# Patient Record
Sex: Male | Born: 1945 | Race: White | Hispanic: No | Marital: Married | Smoking: Former smoker
Health system: Southern US, Community
[De-identification: ages and names within clinical notes are randomized; demographics above are authoritative.]

## PROBLEM LIST (undated history)

## (undated) DIAGNOSIS — D649 Anemia, unspecified: Secondary | ICD-10-CM

## (undated) DIAGNOSIS — G20 Parkinson's disease: Secondary | ICD-10-CM

## (undated) DIAGNOSIS — S42009A Fracture of unspecified part of unspecified clavicle, initial encounter for closed fracture: Secondary | ICD-10-CM

## (undated) DIAGNOSIS — I10 Essential (primary) hypertension: Secondary | ICD-10-CM

## (undated) DIAGNOSIS — E039 Hypothyroidism, unspecified: Secondary | ICD-10-CM

## (undated) DIAGNOSIS — F419 Anxiety disorder, unspecified: Secondary | ICD-10-CM

## (undated) DIAGNOSIS — M1990 Unspecified osteoarthritis, unspecified site: Secondary | ICD-10-CM

## (undated) DIAGNOSIS — R0600 Dyspnea, unspecified: Secondary | ICD-10-CM

## (undated) DIAGNOSIS — K219 Gastro-esophageal reflux disease without esophagitis: Secondary | ICD-10-CM

## (undated) HISTORY — PX: APPENDECTOMY: SHX54

## (undated) HISTORY — PX: CHOLECYSTECTOMY: SHX55

---
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
## 1984-11-11 HISTORY — PX: LAMINECTOMY: SHX219

## 1999-11-12 HISTORY — PX: ROTATOR CUFF REPAIR: SHX139

## 2008-11-11 DIAGNOSIS — G20 Parkinson's disease: Secondary | ICD-10-CM

## 2008-11-11 HISTORY — DX: Parkinson's disease: G20

## 2018-01-13 NOTE — Pre-Procedure Instructions (Signed)
Reginald Jenkins  01/13/2018      [id number] - [place], [place] - [address] [facility] [address] [place] [id number] Phone: [phone] Fax: [phone]    Your procedure is scheduled on March 14  [facility] Admitting at 1100 A.M.  Call this number if you have problems the morning of surgery:  [phone]   Remember:  Do not eat food or drink liquids after midnight.  Take these medicines the morning of surgery with A SIP OF WATER Carbidopa-levodopa (Sinemet), Cyclobenzaprine (Flexeril) if needed, Levothyroxine (Synthroid), Patanol eye drops if needed, oxycodone, trihexyphenidyl (Artane), pramipexole (Mirapex)   Stop taking aspirin as directed by your Dr.  Stop taking BC's, Goody's, Herbal medications, Fish Oil, Ibuprofen, Advil, Motrin, Aleve, Vitamins   Do not wear jewelry, make-up or nail polish.  Do not wear lotions, powders, or perfumes, or deodorant.  Do not shave 48 hours prior to surgery.  Men may shave face and neck.  Do not bring valuables to the hospital.  [facility] is not responsible for any belongings or valuables.  Contacts, dentures or bridgework may not be worn into surgery.  Leave your suitcase in the car.  After surgery it may be brought to your room.  For patients admitted to the hospital, discharge time will be determined by your treatment team.  Patients discharged the day of surgery will not be allowed to drive home.   Special instructions:   - Preparing For Surgery  Before surgery, you can play an important role. Because skin is not sterile, your skin needs to be as free of germs as possible. You can reduce the number of germs on your skin by washing with CHG (chlorahexidine gluconate) Soap before surgery.  CHG is an antiseptic cleaner which kills germs and bonds with the skin to continue killing germs even after washing.  Please do not use if you have an allergy to CHG or  antibacterial soaps. If your skin becomes reddened/irritated stop using the CHG.  Do not shave (including legs and underarms) for at least 48 hours prior to first CHG shower. It is OK to shave your face.  Please follow these instructions carefully.   1. Shower the NIGHT BEFORE SURGERY and the MORNING OF SURGERY with CHG.   2. If you chose to wash your hair, wash your hair first as usual with your normal shampoo.  3. After you shampoo, rinse your hair and body thoroughly to remove the shampoo.  4. Use CHG as you would any other liquid soap. You can apply CHG directly to the skin and wash gently with a scrungie or a clean washcloth.   5. Apply the CHG Soap to your body ONLY FROM THE NECK DOWN.  Do not use on open wounds or open sores. Avoid contact with your eyes, ears, mouth and genitals (private parts). Wash Face and genitals (private parts)  with your normal soap.  6. Wash thoroughly, paying special attention to the area where your surgery will be performed.  7. Thoroughly rinse your body with warm water from the neck down.  8. DO NOT shower/wash with your normal soap after using and rinsing off the CHG Soap.  9. Pat yourself dry with a CLEAN TOWEL.  10. Wear CLEAN PAJAMAS to bed the night before surgery, wear comfortable clothes the morning of surgery  11. Place CLEAN SHEETS on your bed the night of your first shower  and DO NOT SLEEP WITH PETS.    Day of Surgery: Do not apply any deodorants/lotions. Please wear clean clothes to the hospital/surgery center.      Please read over the following fact sheets that you were given. Pain Booklet, Coughing and Deep Breathing, MRSA Information and Surgical Site Infection Prevention

## 2018-01-14 ENCOUNTER — Encounter (HOSPITAL_COMMUNITY): Payer: Self-pay

## 2018-01-14 ENCOUNTER — Other Ambulatory Visit: Payer: Self-pay

## 2018-01-14 ENCOUNTER — Encounter (HOSPITAL_COMMUNITY)
Admission: RE | Admit: 2018-01-14 | Discharge: 2018-01-14 | Disposition: A | Discharge status: Home / Self Care | Payer: Non-veteran care | Source: Ambulatory Visit | Attending: Orthopedic Surgery | Admitting: Orthopedic Surgery

## 2018-01-14 DIAGNOSIS — G20 Parkinson's disease: Secondary | ICD-10-CM | POA: Insufficient documentation

## 2018-01-14 DIAGNOSIS — K219 Gastro-esophageal reflux disease without esophagitis: Secondary | ICD-10-CM | POA: Insufficient documentation

## 2018-01-14 DIAGNOSIS — I10 Essential (primary) hypertension: Secondary | ICD-10-CM | POA: Insufficient documentation

## 2018-01-14 DIAGNOSIS — E039 Hypothyroidism, unspecified: Secondary | ICD-10-CM | POA: Insufficient documentation

## 2018-01-14 DIAGNOSIS — Z79899 Other long term (current) drug therapy: Secondary | ICD-10-CM | POA: Diagnosis not present

## 2018-01-14 DIAGNOSIS — D649 Anemia, unspecified: Secondary | ICD-10-CM | POA: Insufficient documentation

## 2018-01-14 DIAGNOSIS — Z01812 Encounter for preprocedural laboratory examination: Secondary | ICD-10-CM | POA: Insufficient documentation

## 2018-01-14 DIAGNOSIS — Z87891 Personal history of nicotine dependence: Secondary | ICD-10-CM | POA: Insufficient documentation

## 2018-01-14 DIAGNOSIS — Z7982 Long term (current) use of aspirin: Secondary | ICD-10-CM | POA: Diagnosis not present

## 2018-01-14 HISTORY — DX: Parkinson's disease: G20

## 2018-01-14 HISTORY — DX: Anemia, unspecified: D64.9

## 2018-01-14 HISTORY — DX: Essential (primary) hypertension: I10

## 2018-01-14 HISTORY — DX: Hypothyroidism, unspecified: E03.9

## 2018-01-14 HISTORY — DX: Dyspnea, unspecified: R06.00

## 2018-01-14 HISTORY — DX: Gastro-esophageal reflux disease without esophagitis: K21.9

## 2018-01-14 HISTORY — DX: Anxiety disorder, unspecified: F41.9

## 2018-01-14 HISTORY — DX: Unspecified osteoarthritis, unspecified site: M19.90

## 2018-01-14 HISTORY — DX: Fracture of unspecified part of unspecified clavicle, initial encounter for closed fracture: S42.009A

## 2018-01-14 LAB — CBC
HEMATOCRIT: 40.8 % (ref 39.0–52.0)
HEMOGLOBIN: 13.7 g/dL (ref 13.0–17.0)
MCH: 29 pg (ref 26.0–34.0)
MCHC: 33.6 g/dL (ref 30.0–36.0)
MCV: 86.4 fL (ref 78.0–100.0)
Platelets: 224 10*3/uL (ref 150–400)
RBC: 4.72 MIL/uL (ref 4.22–5.81)
RDW: 14.2 % (ref 11.5–15.5)
WBC: 6.5 10*3/uL (ref 4.0–10.5)

## 2018-01-14 LAB — BASIC METABOLIC PANEL
ANION GAP: 12 (ref 5–15)
BUN: 14 mg/dL (ref 6–20)
CHLORIDE: 99 mmol/L — AB (ref 101–111)
CO2: 26 mmol/L (ref 22–32)
Calcium: 9.1 mg/dL (ref 8.9–10.3)
Creatinine, Ser: 0.73 mg/dL (ref 0.61–1.24)
GFR calc Af Amer: >60 mL/min (ref >60)
GLUCOSE: 115 mg/dL — AB (ref 65–99)
POTASSIUM: 4.3 mmol/L (ref 3.5–5.1)
Sodium: 137 mmol/L (ref 135–145)

## 2018-01-14 LAB — SURGICAL PCR SCREEN
MRSA, PCR: NEGATIVE
STAPHYLOCOCCUS AUREUS: NEGATIVE

## 2018-01-14 NOTE — Progress Notes (Signed)
PCP: Rosary Livelyachel Hyler,NP @ [facility]  Last sleep study done 1995 and was negative.

## 2018-01-14 NOTE — Progress Notes (Signed)
   01/14/18 1019  OBSTRUCTIVE SLEEP APNEA  Have you ever been diagnosed with sleep apnea through a sleep study? No  Do you snore loudly (loud enough to be heard through closed doors)?  1  Do you often feel tired, fatigued, or sleepy during the daytime (such as falling asleep during driving or talking to someone)? 0  Has anyone observed you stop breathing during your sleep? 1  Do you have, or are you being treated for high blood pressure? 1  BMI more than 35 kg/m2? 0  Age > 50 (1-yes) 1  Neck circumference greater than:Male 16 inches or larger, Male 17inches or larger? 0  Male Gender (Yes=1) 1  Obstructive Sleep Apnea Score 5  Score 5 or greater  Results sent to PCP

## 2018-01-20 NOTE — Progress Notes (Signed)
Anesthesia Chart Review:  Pt is a 72 year old male scheduled for L total shoulder arthroplasty on 01/22/2018 with Francena HanlyKevin Supple, MD  [facility]  Cleared for surgery by Grayland JackJennifer Richardson, NP.  Pt was also evaluated by cardiologist N.F. Gerilyn PilgrimJarmukli, [facility] and [facility] for [facility].   PMH includes:  HTN, anemia, hypothyroidism, parkinson's disease, GERD. Former smoker (quit 1968). BMI 27  Medications include: ASA 81 mg, carbidopa-levodopa, iron, levothyroxine, lisinopril, trihexyphenidyl  BP 135/74   Pulse 78   Temp 36.8 C   Resp 20   Ht 5\' 9"  (1.753 m)   Wt 183 lb 4.8 oz (83.1 kg)   SpO2 98%   BMI 27.07 kg/m   Preoperative labs reviewed.    [place] [place] NSR.  LAFB.  If no changes, I anticipate pt can proceed with surgery as scheduled.   Rica Mastngela Kabbe, FNP-BC [facility]/Anesthesiology Phone: [phone] 01/20/2018 10:57 AM

## 2018-01-21 MED ORDER — TRANEXAMIC ACID 1000 MG/10ML IV SOLN
1000.0000 mg | INTRAVENOUS | Status: AC
  Administered 2018-01-22: 1000 mg via INTRAVENOUS
  Filled 2018-01-21: qty 1100

## 2018-01-21 MED ORDER — CEFAZOLIN SODIUM-DEXTROSE 2-4 GM/100ML-% IV SOLN
2.0000 g | INTRAVENOUS | Status: AC
  Administered 2018-01-22: 2 g via INTRAVENOUS
  Filled 2018-01-21: qty 100

## 2018-01-22 ENCOUNTER — Inpatient Hospital Stay (HOSPITAL_COMMUNITY)
Admission: RE | Admit: 2018-01-22 | Discharge: 2018-01-23 | DRG: 483 | Disposition: A | Discharge status: Home / Self Care | Payer: Non-veteran care | Source: Ambulatory Visit | Attending: Orthopedic Surgery | Admitting: Orthopedic Surgery

## 2018-01-22 ENCOUNTER — Encounter (HOSPITAL_COMMUNITY)
Admission: RE | Disposition: A | Discharge status: Home / Self Care | Payer: Self-pay | Source: Ambulatory Visit | Attending: Orthopedic Surgery

## 2018-01-22 ENCOUNTER — Inpatient Hospital Stay (HOSPITAL_COMMUNITY): Payer: Non-veteran care | Admitting: Emergency Medicine

## 2018-01-22 ENCOUNTER — Inpatient Hospital Stay (HOSPITAL_COMMUNITY): Payer: Non-veteran care | Admitting: Certified Registered"

## 2018-01-22 DIAGNOSIS — M19012 Primary osteoarthritis, left shoulder: Principal | ICD-10-CM | POA: Diagnosis present

## 2018-01-22 DIAGNOSIS — Z9049 Acquired absence of other specified parts of digestive tract: Secondary | ICD-10-CM

## 2018-01-22 DIAGNOSIS — F419 Anxiety disorder, unspecified: Secondary | ICD-10-CM | POA: Diagnosis present

## 2018-01-22 DIAGNOSIS — G20 Parkinson's disease: Secondary | ICD-10-CM | POA: Diagnosis present

## 2018-01-22 DIAGNOSIS — Z7982 Long term (current) use of aspirin: Secondary | ICD-10-CM

## 2018-01-22 DIAGNOSIS — E039 Hypothyroidism, unspecified: Secondary | ICD-10-CM | POA: Diagnosis present

## 2018-01-22 DIAGNOSIS — Z79899 Other long term (current) drug therapy: Secondary | ICD-10-CM

## 2018-01-22 DIAGNOSIS — Z87891 Personal history of nicotine dependence: Secondary | ICD-10-CM | POA: Diagnosis not present

## 2018-01-22 DIAGNOSIS — I10 Essential (primary) hypertension: Secondary | ICD-10-CM | POA: Diagnosis present

## 2018-01-22 DIAGNOSIS — M25712 Osteophyte, left shoulder: Secondary | ICD-10-CM | POA: Diagnosis present

## 2018-01-22 DIAGNOSIS — Z79890 Hormone replacement therapy (postmenopausal): Secondary | ICD-10-CM | POA: Diagnosis not present

## 2018-01-22 DIAGNOSIS — K219 Gastro-esophageal reflux disease without esophagitis: Secondary | ICD-10-CM | POA: Diagnosis present

## 2018-01-22 DIAGNOSIS — Z96619 Presence of unspecified artificial shoulder joint: Secondary | ICD-10-CM

## 2018-01-22 HISTORY — PX: TOTAL SHOULDER ARTHROPLASTY: SHX126

## 2018-01-22 SURGERY — ARTHROPLASTY, SHOULDER, TOTAL
Anesthesia: Regional | Site: Shoulder | Laterality: Left

## 2018-01-22 MED ORDER — LIDOCAINE HCL (CARDIAC) 20 MG/ML IV SOLN
INTRAVENOUS | Status: AC
  Filled 2018-01-22: qty 5

## 2018-01-22 MED ORDER — MIDAZOLAM HCL 2 MG/2ML IJ SOLN
INTRAMUSCULAR | Status: AC
  Filled 2018-01-22: qty 2

## 2018-01-22 MED ORDER — LACTATED RINGERS IV SOLN
INTRAVENOUS | Status: DC
  Administered 2018-01-22: 50 mL/h via INTRAVENOUS
  Administered 2018-01-22: 13:00:00 via INTRAVENOUS

## 2018-01-22 MED ORDER — ACETAMINOPHEN 325 MG PO TABS: 325.0000 mg | ORAL_TABLET | Freq: Four times a day (QID) | ORAL | Status: DC | PRN

## 2018-01-22 MED ORDER — LACTATED RINGERS IV SOLN: INTRAVENOUS | Status: DC

## 2018-01-22 MED ORDER — DEXAMETHASONE SODIUM PHOSPHATE 10 MG/ML IJ SOLN
INTRAMUSCULAR | Status: DC | PRN
  Administered 2018-01-22: 10 mg via INTRAVENOUS

## 2018-01-22 MED ORDER — KETOROLAC TROMETHAMINE 15 MG/ML IJ SOLN
7.5000 mg | Freq: Four times a day (QID) | INTRAMUSCULAR | Status: AC
  Administered 2018-01-22 – 2018-01-23 (×4): 7.5 mg via INTRAVENOUS
  Filled 2018-01-22 (×4): qty 1

## 2018-01-22 MED ORDER — LISINOPRIL 5 MG PO TABS
5.0000 mg | ORAL_TABLET | Freq: Every evening | ORAL | Status: DC
  Administered 2018-01-22: 5 mg via ORAL
  Filled 2018-01-22: qty 1

## 2018-01-22 MED ORDER — ROCURONIUM BROMIDE 10 MG/ML (PF) SYRINGE
PREFILLED_SYRINGE | INTRAVENOUS | Status: DC | PRN
  Administered 2018-01-22: 20 mg via INTRAVENOUS
  Administered 2018-01-22: 60 mg via INTRAVENOUS

## 2018-01-22 MED ORDER — FENTANYL CITRATE (PF) 100 MCG/2ML IJ SOLN
INTRAMUSCULAR | Status: AC
  Filled 2018-01-22: qty 2

## 2018-01-22 MED ORDER — PHENOL 1.4 % MT LIQD: 1.0000 | OROMUCOSAL | Status: DC | PRN

## 2018-01-22 MED ORDER — ONDANSETRON HCL 4 MG/2ML IJ SOLN
INTRAMUSCULAR | Status: DC | PRN
  Administered 2018-01-22: 4 mg via INTRAVENOUS

## 2018-01-22 MED ORDER — DEXAMETHASONE SODIUM PHOSPHATE 10 MG/ML IJ SOLN
INTRAMUSCULAR | Status: AC
  Filled 2018-01-22: qty 1

## 2018-01-22 MED ORDER — SUGAMMADEX SODIUM 200 MG/2ML IV SOLN
INTRAVENOUS | Status: DC | PRN
  Administered 2018-01-22: 166 mg via INTRAVENOUS

## 2018-01-22 MED ORDER — POLYETHYLENE GLYCOL 3350 17 G PO PACK: 17.0000 g | PACK | Freq: Every day | ORAL | Status: DC | PRN

## 2018-01-22 MED ORDER — HYDROMORPHONE HCL 1 MG/ML IJ SOLN: 0.5000 mg | INTRAMUSCULAR | Status: DC | PRN

## 2018-01-22 MED ORDER — ROCURONIUM BROMIDE 10 MG/ML (PF) SYRINGE
PREFILLED_SYRINGE | INTRAVENOUS | Status: AC
  Filled 2018-01-22: qty 5

## 2018-01-22 MED ORDER — BUPIVACAINE LIPOSOME 1.3 % IJ SUSP
INTRAMUSCULAR | Status: DC | PRN
  Administered 2018-01-22: 10 mL via PERINEURAL

## 2018-01-22 MED ORDER — LEVOTHYROXINE SODIUM 75 MCG PO TABS
150.0000 ug | ORAL_TABLET | Freq: Every day | ORAL | Status: DC
  Administered 2018-01-23: 150 ug via ORAL
  Filled 2018-01-22: qty 2

## 2018-01-22 MED ORDER — DIPHENHYDRAMINE HCL 12.5 MG/5ML PO ELIX: 12.5000 mg | ORAL_SOLUTION | ORAL | Status: DC | PRN

## 2018-01-22 MED ORDER — BUPIVACAINE HCL (PF) 0.5 % IJ SOLN
INTRAMUSCULAR | Status: DC | PRN
  Administered 2018-01-22: 15 mL via PERINEURAL

## 2018-01-22 MED ORDER — CHLORHEXIDINE GLUCONATE 4 % EX LIQD: 60.0000 mL | Freq: Once | CUTANEOUS | Status: DC

## 2018-01-22 MED ORDER — ONDANSETRON HCL 4 MG PO TABS: 4.0000 mg | ORAL_TABLET | Freq: Four times a day (QID) | ORAL | Status: DC | PRN

## 2018-01-22 MED ORDER — ONDANSETRON HCL 4 MG/2ML IJ SOLN
INTRAMUSCULAR | Status: AC
  Filled 2018-01-22: qty 2

## 2018-01-22 MED ORDER — CARBIDOPA-LEVODOPA 25-100 MG PO TABS
1.0000 | ORAL_TABLET | Freq: Four times a day (QID) | ORAL | Status: DC
Start: 2018-01-22 — End: 2018-01-23
  Administered 2018-01-22 – 2018-01-23 (×4): 1 via ORAL
  Filled 2018-01-22 (×4): qty 1

## 2018-01-22 MED ORDER — FENTANYL CITRATE (PF) 100 MCG/2ML IJ SOLN: 25.0000 ug | INTRAMUSCULAR | Status: DC | PRN

## 2018-01-22 MED ORDER — PROPOFOL 10 MG/ML IV BOLUS
INTRAVENOUS | Status: DC | PRN
  Administered 2018-01-22: 150 mg via INTRAVENOUS

## 2018-01-22 MED ORDER — MENTHOL 3 MG MT LOZG: 1.0000 | LOZENGE | OROMUCOSAL | Status: DC | PRN

## 2018-01-22 MED ORDER — PRAMIPEXOLE DIHYDROCHLORIDE 1 MG PO TABS
1.0000 mg | ORAL_TABLET | Freq: Four times a day (QID) | ORAL | Status: DC
  Administered 2018-01-23 (×3): 1 mg via ORAL
  Filled 2018-01-22 (×2): qty 1
  Filled 2018-01-22: qty 8
  Filled 2018-01-22 (×5): qty 1

## 2018-01-22 MED ORDER — METHOCARBAMOL 500 MG PO TABS
500.0000 mg | ORAL_TABLET | Freq: Four times a day (QID) | ORAL | Status: DC | PRN
  Administered 2018-01-22 – 2018-01-23 (×2): 500 mg via ORAL
  Filled 2018-01-22 (×2): qty 1

## 2018-01-22 MED ORDER — TRIHEXYPHENIDYL HCL 2 MG PO TABS
2.0000 mg | ORAL_TABLET | Freq: Three times a day (TID) | ORAL | Status: DC
  Filled 2018-01-22 (×4): qty 1

## 2018-01-22 MED ORDER — ACETAMINOPHEN 500 MG PO TABS
1000.0000 mg | ORAL_TABLET | Freq: Four times a day (QID) | ORAL | Status: AC
  Administered 2018-01-22 – 2018-01-23 (×4): 1000 mg via ORAL
  Filled 2018-01-22 (×4): qty 2

## 2018-01-22 MED ORDER — DEXTROSE 5 % IV SOLN
INTRAVENOUS | Status: DC | PRN
  Administered 2018-01-22: 40 ug/min via INTRAVENOUS

## 2018-01-22 MED ORDER — ALUM & MAG HYDROXIDE-SIMETH 200-200-20 MG/5ML PO SUSP: 30.0000 mL | ORAL | Status: DC | PRN

## 2018-01-22 MED ORDER — DEXTROSE 5 % IV SOLN
500.0000 mg | Freq: Four times a day (QID) | INTRAVENOUS | Status: DC | PRN
  Filled 2018-01-22: qty 5

## 2018-01-22 MED ORDER — SUGAMMADEX SODIUM 200 MG/2ML IV SOLN
INTRAVENOUS | Status: AC
  Filled 2018-01-22: qty 2

## 2018-01-22 MED ORDER — ONDANSETRON HCL 4 MG/2ML IJ SOLN: 4.0000 mg | Freq: Four times a day (QID) | INTRAMUSCULAR | Status: DC | PRN

## 2018-01-22 MED ORDER — OXYCODONE HCL 5 MG PO TABS
5.0000 mg | ORAL_TABLET | ORAL | Status: DC | PRN
  Administered 2018-01-22: 5 mg via ORAL
  Administered 2018-01-23: 10 mg via ORAL
  Filled 2018-01-22: qty 2
  Filled 2018-01-22: qty 1

## 2018-01-22 MED ORDER — DOCUSATE SODIUM 100 MG PO CAPS
100.0000 mg | ORAL_CAPSULE | Freq: Two times a day (BID) | ORAL | Status: DC
  Administered 2018-01-22 – 2018-01-23 (×2): 100 mg via ORAL
  Filled 2018-01-22 (×2): qty 1

## 2018-01-22 MED ORDER — FENTANYL CITRATE (PF) 250 MCG/5ML IJ SOLN
INTRAMUSCULAR | Status: AC
  Filled 2018-01-22: qty 5

## 2018-01-22 MED ORDER — ONDANSETRON HCL 4 MG/2ML IJ SOLN: 4.0000 mg | Freq: Once | INTRAMUSCULAR | Status: DC | PRN

## 2018-01-22 MED ORDER — OXYCODONE HCL 5 MG PO TABS
10.0000 mg | ORAL_TABLET | ORAL | Status: DC | PRN
  Administered 2018-01-23: 10 mg via ORAL
  Filled 2018-01-22: qty 2

## 2018-01-22 MED ORDER — BISACODYL 5 MG PO TBEC: 5.0000 mg | DELAYED_RELEASE_TABLET | Freq: Every day | ORAL | Status: DC | PRN

## 2018-01-22 MED ORDER — CEFAZOLIN SODIUM-DEXTROSE 2-4 GM/100ML-% IV SOLN
2.0000 g | Freq: Four times a day (QID) | INTRAVENOUS | Status: AC
  Administered 2018-01-22 – 2018-01-23 (×3): 2 g via INTRAVENOUS
  Filled 2018-01-22 (×3): qty 100

## 2018-01-22 MED ORDER — PRAMIPEXOLE DIHYDROCHLORIDE 0.125 MG PO TABS: 1.0000 mg | ORAL_TABLET | Freq: Four times a day (QID) | ORAL | Status: DC

## 2018-01-22 MED ORDER — LIDOCAINE 2% (20 MG/ML) 5 ML SYRINGE
INTRAMUSCULAR | Status: DC | PRN
  Administered 2018-01-22: 40 mg via INTRAVENOUS

## 2018-01-22 MED ORDER — ASPIRIN EC 81 MG PO TBEC
81.0000 mg | DELAYED_RELEASE_TABLET | Freq: Every day | ORAL | Status: DC
  Administered 2018-01-22: 81 mg via ORAL
  Filled 2018-01-22 (×2): qty 1

## 2018-01-22 MED ORDER — METOCLOPRAMIDE HCL 5 MG/ML IJ SOLN: 5.0000 mg | Freq: Three times a day (TID) | INTRAMUSCULAR | Status: DC | PRN

## 2018-01-22 MED ORDER — 0.9 % SODIUM CHLORIDE (POUR BTL) OPTIME
TOPICAL | Status: DC | PRN
  Administered 2018-01-22: 1000 mL

## 2018-01-22 MED ORDER — METOCLOPRAMIDE HCL 5 MG PO TABS: 5.0000 mg | ORAL_TABLET | Freq: Three times a day (TID) | ORAL | Status: DC | PRN

## 2018-01-22 MED ORDER — FENTANYL CITRATE (PF) 100 MCG/2ML IJ SOLN
50.0000 ug | Freq: Once | INTRAMUSCULAR | Status: AC
  Administered 2018-01-22: 50 ug via INTRAVENOUS

## 2018-01-22 MED ORDER — FLEET ENEMA 7-19 GM/118ML RE ENEM: 1.0000 | ENEMA | Freq: Once | RECTAL | Status: DC | PRN

## 2018-01-22 SURGICAL SUPPLY — 58 items
BLADE SAW SGTL 83.5X18.5 (BLADE) ×2 IMPLANT
CEMENT BONE DEPUY (Cement) ×2 IMPLANT
COVER SURGICAL LIGHT HANDLE (MISCELLANEOUS) ×2 IMPLANT
DERMABOND ADHESIVE PROPEN (GAUZE/BANDAGES/DRESSINGS) ×1
DERMABOND ADVANCED .7 DNX6 (GAUZE/BANDAGES/DRESSINGS) ×1 IMPLANT
DRAPE ORTHO SPLIT 77X108 STRL (DRAPES) ×2
DRAPE SURG 17X11 SM STRL (DRAPES) ×2 IMPLANT
DRAPE SURG ORHT 6 SPLT 77X108 (DRAPES) ×2 IMPLANT
DRAPE U-SHAPE 47X51 STRL (DRAPES) ×2 IMPLANT
DRSG AQUACEL AG ADV 3.5X10 (GAUZE/BANDAGES/DRESSINGS) ×2 IMPLANT
DURAPREP 26ML APPLICATOR (WOUND CARE) ×2 IMPLANT
ELECT BLADE 4.0 EZ CLEAN MEGAD (MISCELLANEOUS) ×2
ELECT CAUTERY BLADE 6.4 (BLADE) ×2 IMPLANT
ELECT REM PT RETURN 9FT ADLT (ELECTROSURGICAL) ×2
ELECTRODE BLDE 4.0 EZ CLN MEGD (MISCELLANEOUS) ×1 IMPLANT
ELECTRODE REM PT RTRN 9FT ADLT (ELECTROSURGICAL) ×1 IMPLANT
FACESHIELD WRAPAROUND (MASK) ×6 IMPLANT
GLENOID WITH CLEAT MEDIUM (Shoulder) ×2 IMPLANT
GLOVE BIO SURGEON STRL SZ7.5 (GLOVE) ×2 IMPLANT
GLOVE BIO SURGEON STRL SZ8 (GLOVE) ×2 IMPLANT
GLOVE EUDERMIC 7 POWDERFREE (GLOVE) ×2 IMPLANT
GLOVE SS BIOGEL STRL SZ 7.5 (GLOVE) ×1 IMPLANT
GLOVE SUPERSENSE BIOGEL SZ 7.5 (GLOVE) ×1
GOWN STRL REUS W/ TWL LRG LVL3 (GOWN DISPOSABLE) ×1 IMPLANT
GOWN STRL REUS W/ TWL XL LVL3 (GOWN DISPOSABLE) ×2 IMPLANT
GOWN STRL REUS W/TWL LRG LVL3 (GOWN DISPOSABLE) ×1
GOWN STRL REUS W/TWL XL LVL3 (GOWN DISPOSABLE) ×2
HEAD HUMERAL 17X48 UNIVER 3D (Joint) ×2 IMPLANT
KIT BASIN OR (CUSTOM PROCEDURE TRAY) ×2 IMPLANT
KIT ROOM TURNOVER OR (KITS) ×2 IMPLANT
KIT SET UNIVERSAL (KITS) ×2 IMPLANT
MANIFOLD NEPTUNE II (INSTRUMENTS) ×2 IMPLANT
NDL SUT .5 MAYO 1.404X.05X (NEEDLE) ×1 IMPLANT
NDL SUT 6 .5 CRC .975X.05 MAYO (NEEDLE) ×1 IMPLANT
NEEDLE MAYO TAPER (NEEDLE) ×2
NS IRRIG 1000ML POUR BTL (IV SOLUTION) ×2 IMPLANT
PACK SHOULDER (CUSTOM PROCEDURE TRAY) ×2 IMPLANT
PAD ARMBOARD 7.5X6 YLW CONV (MISCELLANEOUS) ×4 IMPLANT
RESTRAINT HEAD UNIVERSAL NS (MISCELLANEOUS) ×2 IMPLANT
SLING ARM FOAM STRAP LRG (SOFTGOODS) ×2 IMPLANT
SLING ARM XL FOAM STRAP (SOFTGOODS) IMPLANT
SMARTMIX MINI TOWER (MISCELLANEOUS) ×2
SPONGE LAP 18X18 X RAY DECT (DISPOSABLE) IMPLANT
SPONGE LAP 4X18 X RAY DECT (DISPOSABLE) IMPLANT
STEM HUMERAL UNI APEX 10MM (Shoulder) ×2 IMPLANT
SUCTION FRAZIER HANDLE 10FR (MISCELLANEOUS) ×1
SUCTION TUBE FRAZIER 10FR DISP (MISCELLANEOUS) ×1 IMPLANT
SUT FIBERWIRE #2 38 T-5 BLUE (SUTURE) ×8
SUT MNCRL AB 3-0 PS2 18 (SUTURE) ×2 IMPLANT
SUT MON AB 2-0 CT1 36 (SUTURE) ×2 IMPLANT
SUT VIC AB 1 CT1 27 (SUTURE) ×1
SUT VIC AB 1 CT1 27XBRD ANBCTR (SUTURE) ×1 IMPLANT
SUTURE FIBERWR #2 38 T-5 BLUE (SUTURE) ×4 IMPLANT
SYR CONTROL 10ML LL (SYRINGE) IMPLANT
TOWEL OR 17X24 6PK STRL BLUE (TOWEL DISPOSABLE) IMPLANT
TOWEL OR 17X26 10 PK STRL BLUE (TOWEL DISPOSABLE) ×2 IMPLANT
TOWER SMARTMIX MINI (MISCELLANEOUS) ×1 IMPLANT
WATER STERILE IRR 1000ML POUR (IV SOLUTION) IMPLANT

## 2018-01-22 NOTE — Anesthesia Preprocedure Evaluation (Addendum)
Anesthesia Evaluation  Patient identified by MRN, date of birth, ID band Patient awake    Reviewed: Allergy & Precautions, NPO status , [name] Chart, lab work & pertinent test results  Airway Mallampati: II  TM Distance: >3 FB Neck ROM: Full    Dental  (+) Upper Dentures   Pulmonary neg pulmonary ROS, former smoker,    Pulmonary exam normal breath sounds clear to auscultation       Cardiovascular hypertension, Pt. on medications Normal cardiovascular exam Rhythm:Regular Rate:Normal  NSR, rate 81. LAFB.  [facility]  Cleared for surgery by Grayland JackJennifer Richardson, NP.  Pt was also evaluated by cardiologist N.F. Gerilyn PilgrimJarmukli, MD [facility] and cleared for surgery.    Neuro/Psych Anxiety Parkinson disease     GI/Hepatic Neg liver ROS, GERD  Controlled and Medicated,  Endo/Other  Hypothyroidism   Renal/GU negative Renal ROS     Musculoskeletal  (+) Arthritis ,   Abdominal   Peds  Hematology negative hematology ROS (+)   Anesthesia Other Findings Left shoulder osteoarthritis  Reproductive/Obstetrics                            Anesthesia Physical Anesthesia Plan  ASA: III  Anesthesia Plan: General and Regional   Post-op Pain Management: GA combined w/ Regional for post-op pain   Induction: Intravenous  PONV Risk Score and Plan: 2 and Ondansetron, Dexamethasone, Midazolam and Treatment may vary due to age or medical condition  Airway Management Planned: Oral ETT  Additional Equipment:   Intra-op Plan:   Post-operative Plan: Extubation in OR  Informed Consent: I have reviewed the patients History and Physical, chart, labs and discussed the procedure including the risks, benefits and alternatives for the proposed anesthesia with the patient or authorized representative who has indicated his/her understanding and acceptance.   Dental advisory given  Plan Discussed with:  CRNA  Anesthesia Plan Comments:        Anesthesia Quick Evaluation

## 2018-01-22 NOTE — Anesthesia Postprocedure Evaluation (Signed)
Anesthesia Post Note  Patient: Reginald Jenkins  Procedure(s) Performed: LEFT TOTAL SHOULDER ARTHROPLASTY (Left Shoulder)     Patient location during evaluation: PACU Anesthesia Type: Regional and General Level of consciousness: awake and alert Pain management: pain level controlled Vital Signs Assessment: post-procedure vital signs reviewed and stable Respiratory status: spontaneous breathing, nonlabored ventilation, respiratory function stable and patient connected to nasal cannula oxygen Cardiovascular status: blood pressure returned to baseline and stable Postop Assessment: no apparent nausea or vomiting Anesthetic complications: no    Last Vitals:  Vitals:   01/22/18 1713 01/22/18 2058  BP: 137/79 (!) 104/57  Pulse: 73 85  Resp: 15 16  Temp: 36.7 C 36.6 C  SpO2: 100% 94%    Last Pain:  Vitals:   01/22/18 2058  TempSrc: Oral  PainSc:                  Catheryn Baconyan P Ellender

## 2018-01-22 NOTE — Op Note (Signed)
01/22/2018  2:11 PM  PATIENT:   Reginald Jenkins  72 y.o. male  PRE-OPERATIVE DIAGNOSIS:  Left shoulder osteoarthritis  POST-OPERATIVE DIAGNOSIS:  same  PROCEDURE:  L TSA #10 stem, 48x17 concentric head, medium glenoid  SURGEON:  Jaiquan Temme, Vania ReaKevin M. M.D.  ASSISTANTS: Shuford pac   ANESTHESIA:   GET + ISB  EBL: 200  SPECIMEN:  none  Drains: none   PATIENT DISPOSITION:  PACU - hemodynamically stable.    PLAN OF CARE: Admit for overnight observation  Dictation# [id number]   Contact # [phone]

## 2018-01-22 NOTE — H&P (Signed)
Reginald Jenkins    Chief Complaint: Left shoulder osteoarthritis HPI: The patient is a 72 y.o. male with end stage left shoulder OA  Past Medical History:  Diagnosis Date  . Anemia    borderline  . 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 Arthritis   . Broken collarbone    as a child  . Dyspnea    when climbing stairs   . GERD (gastroesophageal reflux disease)   . Hypertension   . Hypothyroidism   . Parkinson disease (HCC) 2010    Past Surgical History:  Procedure Laterality Date  . APPENDECTOMY    . arm surgery Left 1967   a rod was put in then 8 months it was removed  . CHOLECYSTECTOMY    . LAMINECTOMY  1986  . ROTATOR CUFF REPAIR Right 2001    No family history on file.  Social History:  reports that he quit smoking about 51 years ago. His smoking use included cigarettes. He has a 1.50 pack-year smoking history. He quit smokeless tobacco use about 29 years ago. His smokeless tobacco use included chew. He reports that he does not drink alcohol or use drugs.   Medications Prior to Admission  Medication Sig Dispense Refill  . acetaminophen (TYLENOL) 500 MG tablet Take 500 mg by mouth every 6 (six) hours as needed (for pain.).    Marland Kitchen aspirin EC 81 MG tablet Take 81 mg by mouth at bedtime.    . carbidopa-levodopa (SINEMET IR) 25-100 MG tablet Take 1 tablet by mouth 4 (four) times daily.    . cyclobenzaprine (FLEXERIL) 10 MG tablet Take 10 mg by mouth 3 (three) times daily as needed for muscle spasms.    Marland Kitchen docusate sodium (COLACE) 100 MG capsule Take 100 mg by mouth 3 (three) times daily.    . ferrous sulfate 325 (65 FE) MG tablet Take 325 mg by mouth 2 (two) times daily.    Marland Kitchen levothyroxine (SYNTHROID, LEVOTHROID) 150 MCG tablet Take 150 mcg by mouth daily before breakfast.    . lisinopril (PRINIVIL,ZESTRIL) 5 MG tablet Take 5 mg by mouth every evening.    . Multiple Vitamin (MULTIVITAMIN WITH MINERALS) TABS tablet Take 1 tablet by mouth daily. Centrum    . olopatadine  (PATANOL) 0.1 % ophthalmic solution Place 1 drop into both eyes 2 (two) times daily as needed for allergies.    Marland Kitchen oxyCODONE (ROXICODONE) 15 MG immediate release tablet Take 15 mg by mouth 4 (four) times daily.    . pramipexole (MIRAPEX) 1 MG tablet Take 1 mg by mouth 4 (four) times daily.    . trihexyphenidyl (ARTANE) 2 MG tablet Take 2 mg by mouth 3 (three) times daily with meals.     Marland Kitchen zolpidem (AMBIEN CR) 12.5 MG CR tablet Take 12.5 mg by mouth at bedtime.       Physical Exam: left shoulder with painful and restricted motion as noted at recent office visits  Vitals  Temp:  [98.5 F (36.9 C)] 98.5 F (36.9 C) (03/14 1038) Pulse Rate:  [81-88] 88 (03/14 1140) Resp:  [14-20] 14 (03/14 1140) BP: (115)/(65) 115/65 (03/14 1038) SpO2:  [98 %-100 %] 100 % (03/14 1140) Weight:  [83 kg (183 lb)] 83 kg (183 lb) (03/14 1038)  Assessment/Plan  Impression: Left shoulder osteoarthritis  Plan of Action: Procedure(s): LEFT TOTAL SHOULDER ARTHROPLASTY  Deborha Moseley M Vernis Cabacungan 01/22/2018, 11:58 AM Contact # [phone]

## 2018-01-22 NOTE — Anesthesia Procedure Notes (Signed)
Anesthesia Regional Block: Interscalene brachial plexus block   Pre-Anesthetic Checklist: ,, timeout performed, Correct Patient, Correct Site, Correct Laterality, Correct Procedure,, site marked, risks and benefits discussed, Surgical consent,  Pre-op evaluation,  At surgeon's request and post-op pain management  Laterality: Left  Prep: chloraprep       Needles:  Injection technique: Single-shot  Needle Type: Echogenic Stimulator Needle     Needle Length: 9cm  Needle Gauge: 21     Additional Needles:   Procedures:,,,, ultrasound used (permanent image in chart),,,,  Narrative:  Start time: 01/22/2018 11:30 AM End time: 01/22/2018 11:40 AM Injection made incrementally with aspirations every 5 mL.  Performed by: Personally  Anesthesiologist: Leonides GrillsEllender, Ryan P, MD  Additional Notes: Functioning IV was confirmed and monitors were applied.  A 90mm 21ga Arrow echogenic stimulator needle was used. Sterile prep, hand hygiene and sterile gloves were used.  Negative aspiration and negative test dose prior to incremental administration of local anesthetic. The patient tolerated the procedure well.

## 2018-01-22 NOTE — Transfer of Care (Signed)
Immediate Anesthesia Transfer of Care Note  Patient: Reginald Jenkins  Procedure(s) Performed: LEFT TOTAL SHOULDER ARTHROPLASTY (Left Shoulder)  Patient Location: PACU  Anesthesia Type:GA combined with regional for post-op pain  Level of Consciousness: drowsy and patient cooperative  Airway & Oxygen Therapy: Patient Spontanous Breathing and Patient connected to face mask oxygen  Post-op Assessment: Report given to RN and Post -op Vital signs reviewed and stable  Post vital signs: Reviewed and stable  Last Vitals:  Vitals:   01/22/18 1200 01/22/18 1205  BP: 126/72 127/75  Pulse: 79 80  Resp: 12 15  Temp:    SpO2: 100% 100%    Last Pain:  Vitals:   01/22/18 1205  TempSrc:   PainSc: 0-No pain      Patients Stated Pain Goal: 7 (01/22/18 1053)  Complications: No apparent anesthesia complications

## 2018-01-22 NOTE — Discharge Instructions (Signed)

## 2018-01-22 NOTE — Anesthesia Procedure Notes (Signed)
Procedure Name: Intubation Date/Time: 01/22/2018 12:45 PM Performed by: Freddie Breech, CRNA Pre-anesthesia Checklist: Patient identified, Emergency Drugs available, Suction available and Patient being monitored Patient Re-evaluated:Patient Re-evaluated prior to induction Oxygen Delivery Method: Circle System Utilized Preoxygenation: Pre-oxygenation with 100% oxygen Induction Type: IV induction Ventilation: Mask ventilation without difficulty and Oral airway inserted - appropriate to patient size Laryngoscope Size: Mac and 4 Grade View: Grade I Tube type: Oral Tube size: 7.0 mm Number of attempts: 1 Airway Equipment and Method: Stylet and Oral airway Placement Confirmation: ETT inserted through vocal cords under direct vision,  positive ETCO2 and breath sounds checked- equal and bilateral Secured at: 22 cm Tube secured with: Tape Dental Injury: Teeth and Oropharynx as per pre-operative assessment

## 2018-01-23 ENCOUNTER — Encounter (HOSPITAL_COMMUNITY): Payer: Self-pay | Admitting: Orthopedic Surgery

## 2018-01-23 ENCOUNTER — Other Ambulatory Visit: Payer: Self-pay

## 2018-01-23 MED ORDER — OXYCODONE HCL 5 MG PO TABS: 5.0000 mg | ORAL_TABLET | ORAL | 0 refills | Status: AC | PRN

## 2018-01-23 MED ORDER — NAPROXEN 500 MG PO TABS: 500.0000 mg | ORAL_TABLET | Freq: Two times a day (BID) | ORAL | 1 refills | Status: DC

## 2018-01-23 NOTE — Progress Notes (Signed)
Rn removed pt IV and gave pt and family discharge instructions they stated understanding and had no further questiuons.

## 2018-01-23 NOTE — Op Note (Signed)
NAMEstrellita Reginald Jenkins:  Reginald Jenkins               ACCOUNT NO.:  [id number]  MEDICAL RECORD NO.:  [id number]  LOCATION:  5N22C                        FACILITY:  [facility]  PHYSICIAN:  Vania ReaKevin M. Lunden Stieber, M.D.  DATE OF BIRTH:  28-Aug-1946  DATE OF PROCEDURE:  01/22/2018 DATE OF DISCHARGE:                              OPERATIVE REPORT   PREOPERATIVE DIAGNOSIS:  End-stage left shoulder osteoarthritis.  POSTOPERATIVE DIAGNOSIS:  End-stage left shoulder osteoarthritis.  PROCEDURE:  A left total shoulder arthroplasty utilizing a press-fit size 10 Arthrex stem with a 48 x 17 concentric head and a medium glenoid.  ASSISTANT:  Lucita Loraracy A. Shuford, PA-C.  ANESTHESIA:  General endotracheal as well as interscalene block.  ESTIMATED BLOOD LOSS:  200 mL.  DRAINS:  None.  HISTORY:  Reginald Jenkins is a 72 year old gentleman, who has had chronic and progressive increasing left shoulder pain related to end-stage osteoarthritis.  His clinical examination shows severely restricted mobility with pain, but maintained overall good strength.  His plain radiographs show classic changes consistent with end-stage osteoarthritis including subchondral sclerosis, loss of sphericity of the humeral head, a large inferior osteophyte off the humeral head, and loss of joint space.  He was brought to the operating room at this time for a planned left total shoulder arthroplasty as described below.  Preoperatively, I counseled Reginald Jenkins regarding treatment options and potential risks versus benefits thereof.  Possible surgical complications were all reviewed including potential for bleeding, infection, neurovascular injury, persistent pain, loss of motion, anesthetic complication, failure of the implant and possible need for additional surgery.  He understands and accepts and agrees with our planned procedure.  DESCRIPTION OF PROCEDURE:  After undergoing routine preop evaluation, the patient received prophylactic antibiotics.  An  interscalene block was established in the holding area by the Anesthesia Department. Placed supine on the operating table, underwent smooth induction of a general endotracheal anesthesia.  Placed in a beach chair position and appropriately padded and protected.  The left shoulder girdle region was then sterilely prepped and draped in standard fashion.  Time-out was called.  An anterior deltopectoral approach, left shoulder was made through an 8 cm incision.  Skin flaps were elevated and dissection carried deeply.  The deltopectoral interval was developed from proximal to distal with the vein taken laterally.  The upper cm pec major tendon was tenotomized to improve exposure of the conjoint tendon, was mobilized, retracted medially and adhesions divided beneath the deltoid. The biceps tendon was then tenodesed at the upper insertion of the pec major tendon and the proximal portion was then unroofed, tenotomized and excised.  We then divided the subscapularis away from its attachment to the lesser tuberosity using electrocautery leaving a 1 to 1.5 cm cuff of tissue for later repair.  The free margin was tagged with a series of 5, #2 FiberWire grasping sutures.  Capsular attachments on the anterior and inferior aspects of the humeral neck were then divided with the circumflex vessels coagulated.  This allowed delivery of the head through the wound.  I carefully protected the rotator cuff superiorly and posteriorly.  We outlined our proposed humeral head resection with the extramedullary guide and performed our humeral head resection with an  oscillating saw taking care to protect the rotator cuff.  Osteophytes from the margin of the head were then removed.  We then prepared the canal hand reaming up to size 7, broaching up to size 10 with excellent fit and fixation.  Size 9 trial with metal cap placed and medullary canal to protect the cut metaphyseal surface, then we exposed the glenoid with  combination of Fukuda, pitchfork, and snake tongue retractors.  I performed a circumferential labral excision removing the proximal stump of the biceps as well.  Gained complete visualization of the periphery of the glenoid and a guidepin was then directed at the center of the glenoid after we determined the size medium glenoid was the best fit.  The glenoid was then reamed to stable subchondral bony bed.  A central drill hole was placed followed by the superior and inferior peg and slot respectively.  The glenoid was then broached and trial showed excellent fit and fixation.  At this point, the trial was removed.  The glenoid was copiously irrigated, meticulously cleaned and dried.  Cement was mixed, introduced into the superior and inferior PEG and slot respectively.  We then impacted our glenoid with excellent fit and fixation.  We returned our attention to the proximal humerus, where the canal was cleaned and dried.  We impacted our size 10 stem to the proper level and then tightened the proximal locking screws.  We then performed a series of trial reductions and ultimately felt that the 48 x 17 concentric head gave [place] the best soft tissue balance with 50% translation of the humeral head and glenoid.  The final implant was then impacted after the [facility] taper was cleaned and dried.  Final reduction was then performed again showing excellent soft tissue balance and good stability.  The subscapularis was then repaired back to the lesser tuberosity through the cuff of tissue utilizing the series of horizontal mattress sutures, #2 FiberWire in the rotator interval as well as repaired with figure-of-eight #2 FiberWire sutures.  Upon completion, this showed excellent shoulder motion easily achieving 45 degrees of external rotation without excessive tension on the subscapularis.  The wound was copiously irrigated.  I should note that there was violation of the cephalic vein proximally, so we  electrocoagulated the vein proximally.  The deltopectoral interval was then reapproximated with a series of figure-of-eight #1 Vicryl sutures.  2-0 Vicryl was used for the subcu layer, intracuticular 3-0 Monocryl for the skin, followed by Dermabond and Aquacel dressing.  Left arm was placed into a sling and the patient was then awakened, extubated, and taken to the recovery room in stable condition.  Tracy A. Shuford, PA-C, was used as an Geophysicist/field seismologist throughout this case, essential for help with positioning the patient, positioning the extremity, tissue manipulation, suture management, implantation of prosthesis, wound closure, and intraoperative decision making.     Vania Rea. Ethal Gotay, M.D.     KMS/MEDQ  D:  01/22/2018  T:  01/23/2018  Job:  [id number]

## 2018-01-23 NOTE — Evaluation (Signed)
Occupational Therapy Evaluation Patient Details Name: Reginald Jenkins MRN: [id number] DOB: 1946-02-07 Today's Date: 01/23/2018    History of Present Illness Pt. is a 72 y.o. male who recently underwent a L TSA. Pt. has a hx of arthritis, HTN, Parkinson's and anxiety. Past surgical history includes a laminectomy and a R rotator cuff repair (2001).   Clinical Impression   Spouse reports pt. required assistance with UB dressing PTA and uses AE for self-feeding due to tremors. Currently, pt. requires mod assist with UB dressing and bathing and requires cueing due to cognitive deficits. Pt. can transfer sit-stand and ambulate with min guard for safety. Pt. and caregiver were educated on compensatory strategies for UB bathing, UB dressing, and for self-feeding with affected UE. Pt. was able to complete therapeutic exercise involving L hand, wrist, elbow, and pendulums to improve ROM and decrease edema. Pt. would benefit continued skilled OT to address UB ADL, cognitive deficits, and safety awareness. Pt. required cues to recall exercises and to recall compensatory strategies. Pt. will d/c home with spouse and will have 24/7 supervision. Pt would benefit from continued skilled OT to address established goals.    Follow Up Recommendations  Follow surgeon's recommendation for DC plan and follow-up therapies;Supervision/Assistance - 24 hour    Equipment Recommendations  None recommended by OT    Recommendations for Other Services       Precautions / Restrictions Precautions Precautions: Fall;Shoulder Type of Shoulder Precautions: Elbow/wrist/hand AROM OK. Pendulums OK. ROM parameters for ADL ONLY: FF 60, ABD 45, ER 20.  Shoulder Interventions: Shoulder sling/immobilizer;Off for dressing/bathing/exercises(Can remove in controlled environment) Precaution Booklet Issued: Yes (comment) Required Braces or Orthoses: Sling Restrictions Weight Bearing Restrictions: Yes LUE Weight Bearing: Non weight  bearing      Mobility Bed Mobility               General bed mobility comments: Pt sitting in chair upon arrival  Transfers Overall transfer level: Needs assistance Equipment used: None Transfers: Sit to/from Stand Sit to Stand: Supervision         General transfer comment: Needs more time, supervision for safety    Balance Overall balance assessment: Mild deficits observed, not formally tested                                         ADL either performed or assessed with clinical judgement   ADL Overall ADL's : Needs assistance/impaired Eating/Feeding: With adaptive utensils;Sitting;Set up(Weighted spoon) Eating/Feeding Details (indicate cue type and reason): Pt. reports he uses weighted spoon due to tremors. Grooming: Supervision/safety;Set up;Sitting   Upper Body Bathing: Moderate assistance;Cueing for compensatory techniques;Sitting;Cueing for UE precautions;Cueing for safety   Lower Body Bathing: Minimal assistance;Cueing for safety;Sit to/from stand   Upper Body Dressing : Moderate assistance;Cueing for UE precautions;Cueing for compensatory techniques;Cueing for safety;Sitting   Lower Body Dressing: Minimal assistance;Cueing for safety;Sit to/from stand   Toilet Transfer: Min guard;Ambulation Toilet Transfer Details (indicate cue type and reason): Simulated during sit-stand transfer.       Tub/Shower Transfer Details (indicate cue type and reason): Educated on use of shower seat for bathing. Functional mobility during ADLs: Min guard General ADL Comments: Pt. was educated on compensatory strategies for self-feeding, UB bathing, UB dressing, sling management and wear schedule, proper positioning of LUE in bed/chair, LUE pendulum exercises and elbow/wrist/hand exercises.      Vision  Perception     Praxis      Pertinent Vitals/Pain Pain Assessment: Faces Faces Pain Scale: Hurts little more Pain Location: L shoulder Pain  Descriptors / Indicators: Discomfort;Sore Pain Intervention(s): Limited activity within patient's tolerance;Monitored during session;Ice applied     Hand Dominance Right   Extremity/Trunk Assessment Upper Extremity Assessment Upper Extremity Assessment: LUE deficits/detail LUE Deficits / Details: Decreased ROM at elbow due to edema. Wrist and hand ROM WFL. LUE: Unable to fully assess due to immobilization LUE Sensation: decreased light touch(Impaired sensation at left shoulder, suspect to nerve block.)   Lower Extremity Assessment Lower Extremity Assessment: Defer to PT evaluation   Cervical / Trunk Assessment Cervical / Trunk Assessment: Normal   Communication Communication Communication: No difficulties   Cognition Arousal/Alertness: Awake/alert Behavior During Therapy: WFL for tasks assessed/performed Overall Cognitive Status: History of cognitive impairments - at baseline                                 General Comments: Pt. required cueing to recall hand, wrist, and shoulder exercises.   General Comments       Exercises Exercises: Shoulder General Exercises - Upper Extremity Elbow Flexion: AROM;Self ROM;Left;10 reps;Seated Elbow Extension: AROM;Left;Self ROM;10 reps;Seated Wrist Flexion: AROM;10 reps;Seated;Self ROM;Left Wrist Extension: AROM;Left;10 reps;Seated;Self ROM Digit Composite Flexion: AROM;Left;10 reps;Seated;Self ROM Composite Extension: AROM;Self ROM;10 reps;Seated Shoulder Exercises Pendulum Exercise: AROM;10 reps;Standing(front<>back, side<>side)   Shoulder Instructions Shoulder Instructions Donning/doffing shirt without moving shoulder: Moderate assistance Method for sponge bathing under operated UE: Moderate assistance Donning/doffing sling/immobilizer: Minimal assistance Correct positioning of sling/immobilizer: Minimal assistance Pendulum exercises (written home exercise program): Min-guard ROM for elbow, wrist and digits of  operated UE: Minimal assistance(required cueing) Sling wearing schedule (on at all times/off for ADL's): Minimal assistance Proper positioning of operated UE when showering: Minimal assistance Positioning of UE while sleeping: Minimal assistance    Home Living Family/patient expects to be discharged to:: Private residence Living Arrangements: Spouse/significant other Available Help at Discharge: Family;Available 24 hours/day Type of Home: House Home Access: Ramped entrance     Home Layout: One level     Bathroom Shower/Tub: Producer, television/film/videoWalk-in shower   Bathroom Toilet: Standard     Home Equipment: Other (comment);Shower seat - built in(lift chair)          Prior Functioning/Environment Level of Independence: Needs assistance    ADL's / Homemaking Assistance Needed: Pt. reports he was independent in ADLs PTA. Spouse reported pt. needed assistance with UB dressing and bathing.            OT Problem List: Decreased strength;Decreased range of motion;Impaired balance (sitting and/or standing);Decreased cognition;Decreased safety awareness;Decreased knowledge of precautions;Decreased knowledge of use of DME or AE;Impaired UE functional use;Pain;Increased edema      OT Treatment/Interventions: Self-care/ADL training;Therapeutic exercise;DME and/or AE instruction;Therapeutic activities;Patient/family education;Balance training    OT Goals(Current goals can be found in the care plan section) Acute Rehab OT Goals Patient Stated Goal: return home OT Goal Formulation: With patient/family Time For Goal Achievement: 02/06/18 Potential to Achieve Goals: Good ADL Goals Pt Will Perform Upper Body Bathing: with supervision;sitting Pt Will Perform Upper Body Dressing: with supervision;sitting Pt Will Perform Tub/Shower Transfer: with supervision;ambulating;shower seat Additional ADL Goal #1: Pt will independently perform LUE elbow/wrist/hand AROM exercises and pendulums.  OT Frequency: Min  2X/week   Barriers to D/C:            Co-evaluation  AM-PAC PT "6 Clicks" Daily Activity     Outcome Measure Help from another person eating meals?: A Little Help from another person taking care of personal grooming?: A Little Help from another person toileting, which includes using toliet, bedpan, or urinal?: A Little Help from another person bathing (including washing, rinsing, drying)?: A Lot Help from another person to put on and taking off regular upper body clothing?: A Lot Help from another person to put on and taking off regular lower body clothing?: A Little 6 Click Score: 16   End of Session Equipment Utilized During Treatment: Other (comment)(L sling) Nurse Communication: Mobility status  Activity Tolerance: Patient tolerated treatment well Patient left: in chair;with call bell/phone within reach;with family/visitor present  OT Visit Diagnosis: Unsteadiness on feet (R26.81)                Time: 1610-9604 OT Time Calculation (min): 32 min Charges:  OT General Charges $OT Visit: 1 Visit OT Evaluation $OT Eval Moderate Complexity: 1 Mod OT Treatments $Self Care/Home Management : 8-22 mins G-Codes:     Burlin Mcnair A. Brett Albino, M.S., OTR/L Pager: [phone]  Gaye Alken 01/23/2018, 9:08 AM

## 2018-01-26 ENCOUNTER — Encounter (HOSPITAL_COMMUNITY): Payer: Self-pay | Admitting: Orthopedic Surgery

## 2018-01-26 NOTE — Discharge Summary (Signed)
PATIENT ID:      Reginald Conchhomas F Tye  MRN:     [id number] DOB/AGE:    04/02/1946 / 72 y.o.     DISCHARGE SUMMARY  ADMISSION DATE:    01/22/2018 DISCHARGE DATE:  01/23/2018  ADMISSION DIAGNOSIS: Left shoulder osteoarthritis Past Medical History:  Diagnosis Date  . Anemia    borderline  . 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 Arthritis   . Broken collarbone    as a child  . Dyspnea    when climbing stairs   . GERD (gastroesophageal reflux disease)   . Hypertension   . Hypothyroidism   . Parkinson disease (HCC) 2010    DISCHARGE DIAGNOSIS:   Active Problems:   Status post total shoulder arthroplasty   PROCEDURE: Procedure(s): LEFT TOTAL SHOULDER ARTHROPLASTY on 01/22/2018  CONSULTS:    HISTORY:  See H&P in chart.  HOSPITAL COURSE:  Reginald Jenkins is a 72 y.o. admitted on 01/22/2018 with a diagnosis of Left shoulder osteoarthritis.  They were brought to the operating room on 01/22/2018 and underwent Procedure(s): LEFT TOTAL SHOULDER ARTHROPLASTY.    They were given perioperative antibiotics:  Anti-infectives (From admission, onward)   Start     Dose/Rate Route Frequency Ordered Stop   01/22/18 1900  ceFAZolin (ANCEF) IVPB 2g/100 mL premix     2 g 200 mL/hr over 30 Minutes Intravenous Every 6 hours 01/22/18 1732 01/23/18 0634   01/22/18 1130  ceFAZolin (ANCEF) IVPB 2g/100 mL premix     2 g 200 mL/hr over 30 Minutes Intravenous To [facility] 01/21/18 1030 01/22/18 1253    .  Patient underwent the above named procedure and tolerated it well. The following day they were hemodynamically stable and pain was controlled on oral analgesics. They were neurovascularly intact to the operative extremity. OT was ordered and worked with patient per protocol. They were medically and orthopaedically stable for discharge on 01/23/2018.     DIAGNOSTIC STUDIES:  RECENT RADIOGRAPHIC STUDIES :  No results found.  RECENT VITAL SIGNS:  No data found.Marland Kitchen.  RECENT EKG RESULTS:   No  orders found for this or any previous visit.  DISCHARGE INSTRUCTIONS:    DISCHARGE MEDICATIONS:   Allergies as of 01/23/2018   No Known Allergies     Medication List    TAKE these medications   acetaminophen 500 MG tablet Commonly known as:  TYLENOL Take 500 mg by mouth every 6 (six) hours as needed (for pain.).   aspirin EC 81 MG tablet Take 81 mg by mouth at bedtime.   carbidopa-levodopa 25-100 MG tablet Commonly known as:  SINEMET IR Take 1 tablet by mouth 4 (four) times daily.   cyclobenzaprine 10 MG tablet Commonly known as:  FLEXERIL Take 10 mg by mouth 3 (three) times daily as needed for muscle spasms.   docusate sodium 100 MG capsule Commonly known as:  COLACE Take 100 mg by mouth 3 (three) times daily.   ferrous sulfate 325 (65 FE) MG tablet Take 325 mg by mouth 2 (two) times daily.   levothyroxine 150 MCG tablet Commonly known as:  SYNTHROID, LEVOTHROID Take 150 mcg by mouth daily before breakfast.   lisinopril 5 MG tablet Commonly known as:  PRINIVIL,ZESTRIL Take 5 mg by mouth every evening.   multivitamin with minerals Tabs tablet Take 1 tablet by mouth daily. Centrum   naproxen 500 MG tablet Commonly known as:  NAPROSYN Take 1 tablet (500 mg total) by mouth 2 (two) times daily  with a meal.   olopatadine 0.1 % ophthalmic solution Commonly known as:  PATANOL Place 1 drop into both eyes 2 (two) times daily as needed for allergies.   oxyCODONE 15 MG immediate release tablet Commonly known as:  ROXICODONE Take 15 mg by mouth 4 (four) times daily. What changed:  Another medication with the same name was added. Make sure you understand how and when to take each.   oxyCODONE 5 MG immediate release tablet Commonly known as:  ROXICODONE Take 1 tablet (5 mg total) by mouth every 4 (four) hours as needed for severe pain (to be used in adjunct to current dose for post op pain not controlled by normal oxycodone dose). What changed:  You were already taking  a medication with the same name, and this prescription was added. Make sure you understand how and when to take each.   pramipexole 1 MG tablet Commonly known as:  MIRAPEX Take 1 mg by mouth 4 (four) times daily.   trihexyphenidyl 2 MG tablet Commonly known as:  ARTANE Take 2 mg by mouth 3 (three) times daily with meals.   zolpidem 12.5 MG CR tablet Commonly known as:  AMBIEN CR Take 12.5 mg by mouth at bedtime.       FOLLOW UP VISIT:   Follow-up Information    Francena Hanly, MD.   Specialty:  Orthopedic Surgery Why:  call to be seen in 10-14 days Contact information: [address] [place] [address] [phone]           DISCHARGE OZ:HYQM   DISCHARGE CONDITION:  Rodolph Bong for Dr. Francena Hanly 01/26/2018, 2:08 PM

## 2020-06-16 ENCOUNTER — Emergency Department (HOSPITAL_COMMUNITY): Payer: No Typology Code available for payment source

## 2020-06-16 ENCOUNTER — Inpatient Hospital Stay (HOSPITAL_COMMUNITY)
Admission: EM | Admit: 2020-06-16 | Discharge: 2020-06-21 | DRG: 091 | Disposition: A | Discharge status: Rehabilitation Facility | Payer: No Typology Code available for payment source | Attending: Internal Medicine | Admitting: Internal Medicine

## 2020-06-16 ENCOUNTER — Encounter (HOSPITAL_COMMUNITY): Payer: Self-pay | Admitting: Emergency Medicine

## 2020-06-16 ENCOUNTER — Other Ambulatory Visit: Payer: Self-pay

## 2020-06-16 DIAGNOSIS — E871 Hypo-osmolality and hyponatremia: Secondary | ICD-10-CM | POA: Diagnosis present

## 2020-06-16 DIAGNOSIS — Y838 Other surgical procedures as the cause of abnormal reaction of the patient, or of later complication, without mention of misadventure at the time of the procedure: Secondary | ICD-10-CM | POA: Diagnosis present

## 2020-06-16 DIAGNOSIS — Z20822 Contact with and (suspected) exposure to covid-19: Secondary | ICD-10-CM | POA: Diagnosis present

## 2020-06-16 DIAGNOSIS — M549 Dorsalgia, unspecified: Secondary | ICD-10-CM | POA: Diagnosis not present

## 2020-06-16 DIAGNOSIS — Z791 Long term (current) use of non-steroidal anti-inflammatories (NSAID): Secondary | ICD-10-CM

## 2020-06-16 DIAGNOSIS — Z87891 Personal history of nicotine dependence: Secondary | ICD-10-CM

## 2020-06-16 DIAGNOSIS — G20 Parkinson's disease: Secondary | ICD-10-CM | POA: Diagnosis not present

## 2020-06-16 DIAGNOSIS — Z9049 Acquired absence of other specified parts of digestive tract: Secondary | ICD-10-CM

## 2020-06-16 DIAGNOSIS — E538 Deficiency of other specified B group vitamins: Secondary | ICD-10-CM | POA: Diagnosis present

## 2020-06-16 DIAGNOSIS — R443 Hallucinations, unspecified: Secondary | ICD-10-CM | POA: Diagnosis present

## 2020-06-16 DIAGNOSIS — K219 Gastro-esophageal reflux disease without esophagitis: Secondary | ICD-10-CM | POA: Diagnosis present

## 2020-06-16 DIAGNOSIS — I10 Essential (primary) hypertension: Secondary | ICD-10-CM | POA: Diagnosis not present

## 2020-06-16 DIAGNOSIS — Z96612 Presence of left artificial shoulder joint: Secondary | ICD-10-CM | POA: Diagnosis present

## 2020-06-16 DIAGNOSIS — E039 Hypothyroidism, unspecified: Secondary | ICD-10-CM | POA: Diagnosis not present

## 2020-06-16 DIAGNOSIS — M545 Low back pain: Secondary | ICD-10-CM | POA: Diagnosis present

## 2020-06-16 DIAGNOSIS — G9782 Other postprocedural complications and disorders of nervous system: Principal | ICD-10-CM | POA: Diagnosis present

## 2020-06-16 DIAGNOSIS — D509 Iron deficiency anemia, unspecified: Secondary | ICD-10-CM | POA: Diagnosis present

## 2020-06-16 DIAGNOSIS — Z79890 Hormone replacement therapy (postmenopausal): Secondary | ICD-10-CM

## 2020-06-16 DIAGNOSIS — Z79899 Other long term (current) drug therapy: Secondary | ICD-10-CM

## 2020-06-16 DIAGNOSIS — R410 Disorientation, unspecified: Secondary | ICD-10-CM | POA: Diagnosis present

## 2020-06-16 DIAGNOSIS — Z9682 Presence of neurostimulator: Secondary | ICD-10-CM

## 2020-06-16 DIAGNOSIS — G8929 Other chronic pain: Secondary | ICD-10-CM | POA: Diagnosis present

## 2020-06-16 DIAGNOSIS — R471 Dysarthria and anarthria: Secondary | ICD-10-CM | POA: Diagnosis present

## 2020-06-16 DIAGNOSIS — G936 Cerebral edema: Secondary | ICD-10-CM | POA: Diagnosis present

## 2020-06-16 DIAGNOSIS — Z7982 Long term (current) use of aspirin: Secondary | ICD-10-CM

## 2020-06-16 DIAGNOSIS — G9341 Metabolic encephalopathy: Secondary | ICD-10-CM | POA: Diagnosis present

## 2020-06-16 LAB — COMPREHENSIVE METABOLIC PANEL
ALT: 5 U/L (ref 0–44)
AST: 19 U/L (ref 15–41)
Albumin: 3.8 g/dL (ref 3.5–5.0)
Alkaline Phosphatase: 110 U/L (ref 38–126)
Anion gap: 13 (ref 5–15)
BUN: 18 mg/dL (ref 8–23)
CO2: 24 mmol/L (ref 22–32)
Calcium: 9.5 mg/dL (ref 8.9–10.3)
Chloride: 96 mmol/L — ABNORMAL LOW (ref 98–111)
Creatinine, Ser: 0.67 mg/dL (ref 0.61–1.24)
GFR calc Af Amer: >60 mL/min (ref >60)
GFR calc non Af Amer: >60 mL/min (ref >60)
Glucose, Bld: 122 mg/dL — ABNORMAL HIGH (ref 70–99)
Potassium: 4.1 mmol/L (ref 3.5–5.1)
Sodium: 133 mmol/L — ABNORMAL LOW (ref 135–145)
Total Bilirubin: 0.7 mg/dL (ref 0.3–1.2)
Total Protein: 7.4 g/dL (ref 6.5–8.1)

## 2020-06-16 LAB — CBC
HCT: 37.9 % — ABNORMAL LOW (ref 39.0–52.0)
Hemoglobin: 12.4 g/dL — ABNORMAL LOW (ref 13.0–17.0)
MCH: 29.9 pg (ref 26.0–34.0)
MCHC: 32.7 g/dL (ref 30.0–36.0)
MCV: 91.3 fL (ref 80.0–100.0)
Platelets: 283 10*3/uL (ref 150–400)
RBC: 4.15 MIL/uL — ABNORMAL LOW (ref 4.22–5.81)
RDW: 13.1 % (ref 11.5–15.5)
WBC: 5 10*3/uL (ref 4.0–10.5)
nRBC: 0 % (ref 0.0–0.2)

## 2020-06-16 LAB — DIFFERENTIAL
Abs Immature Granulocytes: 0.01 10*3/uL (ref 0.00–0.07)
Basophils Absolute: 0 10*3/uL (ref 0.0–0.1)
Basophils Relative: 1 %
Eosinophils Absolute: 0.2 10*3/uL (ref 0.0–0.5)
Eosinophils Relative: 4 %
Immature Granulocytes: 0 %
Lymphocytes Relative: 31 %
Lymphs Abs: 1.6 10*3/uL (ref 0.7–4.0)
Monocytes Absolute: 0.8 10*3/uL (ref 0.1–1.0)
Monocytes Relative: 17 %
Neutro Abs: 2.4 10*3/uL (ref 1.7–7.7)
Neutrophils Relative %: 47 %

## 2020-06-16 LAB — URINALYSIS, ROUTINE W REFLEX MICROSCOPIC
Bilirubin Urine: NEGATIVE
Glucose, UA: NEGATIVE mg/dL
Hgb urine dipstick: NEGATIVE
Ketones, ur: 20 mg/dL — AB
Leukocytes,Ua: NEGATIVE
Nitrite: NEGATIVE
Protein, ur: NEGATIVE mg/dL
Specific Gravity, Urine: 1.026 (ref 1.005–1.030)
pH: 5 (ref 5.0–8.0)

## 2020-06-16 LAB — SARS CORONAVIRUS 2 BY RT PCR (HOSPITAL ORDER, PERFORMED IN ~~LOC~~ HOSPITAL LAB): SARS Coronavirus 2: NEGATIVE

## 2020-06-16 MED ORDER — ACETAMINOPHEN 500 MG PO TABS
500.0000 mg | ORAL_TABLET | Freq: Four times a day (QID) | ORAL | Status: DC | PRN
  Administered 2020-06-20: 500 mg via ORAL
  Filled 2020-06-16: qty 1

## 2020-06-16 MED ORDER — ONDANSETRON HCL 4 MG/2ML IJ SOLN: 4.0000 mg | Freq: Four times a day (QID) | INTRAMUSCULAR | Status: DC | PRN

## 2020-06-16 MED ORDER — SODIUM CHLORIDE 0.9 % IV BOLUS
500.0000 mL | Freq: Once | INTRAVENOUS | Status: AC
  Administered 2020-06-17: 500 mL via INTRAVENOUS

## 2020-06-16 MED ORDER — ACETAMINOPHEN 650 MG RE SUPP
650.0000 mg | Freq: Four times a day (QID) | RECTAL | Status: DC | PRN
Start: 2020-06-16 — End: 2020-06-16

## 2020-06-16 MED ORDER — ONDANSETRON HCL 4 MG PO TABS: 4.0000 mg | ORAL_TABLET | Freq: Four times a day (QID) | ORAL | Status: DC | PRN

## 2020-06-16 MED ORDER — LEVOTHYROXINE SODIUM 75 MCG PO TABS
150.0000 ug | ORAL_TABLET | Freq: Every day | ORAL | Status: DC
  Administered 2020-06-18 – 2020-06-19 (×2): 150 ug via ORAL
  Filled 2020-06-16 (×3): qty 2

## 2020-06-16 MED ORDER — DOCUSATE SODIUM 100 MG PO CAPS
100.0000 mg | ORAL_CAPSULE | Freq: Three times a day (TID) | ORAL | Status: DC
  Administered 2020-06-17: 100 mg via ORAL
  Filled 2020-06-16 (×2): qty 1

## 2020-06-16 MED ORDER — ACETAMINOPHEN 325 MG PO TABS
650.0000 mg | ORAL_TABLET | Freq: Four times a day (QID) | ORAL | Status: DC | PRN
Start: 2020-06-16 — End: 2020-06-16

## 2020-06-16 MED ORDER — OXYCODONE HCL 5 MG PO TABS
15.0000 mg | ORAL_TABLET | Freq: Four times a day (QID) | ORAL | Status: DC
  Administered 2020-06-17 – 2020-06-20 (×12): 15 mg via ORAL
  Filled 2020-06-16 (×19): qty 3

## 2020-06-16 MED ORDER — ADULT MULTIVITAMIN W/MINERALS CH
1.0000 | ORAL_TABLET | Freq: Every day | ORAL | Status: DC
  Filled 2020-06-16: qty 1

## 2020-06-16 MED ORDER — CYCLOBENZAPRINE HCL 10 MG PO TABS
10.0000 mg | ORAL_TABLET | Freq: Three times a day (TID) | ORAL | Status: DC | PRN
  Administered 2020-06-18 – 2020-06-20 (×3): 10 mg via ORAL
  Filled 2020-06-16 (×3): qty 1

## 2020-06-16 MED ORDER — ZOLPIDEM TARTRATE 5 MG PO TABS
5.0000 mg | ORAL_TABLET | Freq: Every evening | ORAL | Status: DC | PRN
  Administered 2020-06-18 – 2020-06-19 (×2): 5 mg via ORAL
  Filled 2020-06-16 (×2): qty 1

## 2020-06-16 MED ORDER — FERROUS SULFATE 325 (65 FE) MG PO TABS
325.0000 mg | ORAL_TABLET | Freq: Two times a day (BID) | ORAL | Status: DC
  Filled 2020-06-16: qty 1

## 2020-06-16 MED ORDER — QUETIAPINE FUMARATE 25 MG PO TABS
25.0000 mg | ORAL_TABLET | Freq: Every day | ORAL | Status: DC
  Administered 2020-06-17 (×2): 25 mg via ORAL
  Filled 2020-06-16 (×3): qty 1

## 2020-06-16 MED ORDER — CARBIDOPA-LEVODOPA 25-100 MG PO TABS
1.0000 | ORAL_TABLET | Freq: Four times a day (QID) | ORAL | Status: DC
  Administered 2020-06-17 – 2020-06-21 (×18): 1 via ORAL
  Filled 2020-06-16 (×20): qty 1

## 2020-06-16 MED ORDER — ASPIRIN EC 81 MG PO TBEC: 81.0000 mg | DELAYED_RELEASE_TABLET | Freq: Every day | ORAL | Status: DC

## 2020-06-16 MED ORDER — TRIHEXYPHENIDYL HCL 2 MG PO TABS
2.0000 mg | ORAL_TABLET | Freq: Three times a day (TID) | ORAL | Status: DC
  Administered 2020-06-17 – 2020-06-21 (×12): 2 mg via ORAL
  Filled 2020-06-16 (×17): qty 1

## 2020-06-16 MED ORDER — OLOPATADINE HCL 0.1 % OP SOLN: 1.0000 [drp] | Freq: Two times a day (BID) | OPHTHALMIC | Status: DC | PRN

## 2020-06-16 MED ORDER — LISINOPRIL 5 MG PO TABS
5.0000 mg | ORAL_TABLET | Freq: Every evening | ORAL | Status: DC
  Administered 2020-06-17 – 2020-06-20 (×5): 5 mg via ORAL
  Filled 2020-06-16 (×5): qty 1

## 2020-06-16 MED ORDER — SODIUM CHLORIDE 0.9% FLUSH
3.0000 mL | Freq: Once | INTRAVENOUS | Status: AC
  Administered 2020-06-16: 3 mL via INTRAVENOUS

## 2020-06-16 NOTE — H&P (Signed)
History and Physical    Reginald Conchhomas F Scrivner ZOX:[id number] DOB: 10/27/1946 DOA: 06/16/2020  PCP: Juel BurrowHyler, Rachel H, NP  Patient coming from: Home  I have personally briefly reviewed patient's [facility]  Chief Complaint: Confusion  HPI: Reginald Jenkins is a 74 y.o. male with medical history significant of parkinson's disease.  Pt just had brain stimulator placed at [facility] on 7/27.  After placement he began to develop confusion, hallucinations, lack of sleep, pacing, irritable behavior.  Despite this he was judged to be okay for discharge and sent home.  While at home this past week the confusion has progressively worsened.  Wife brings him in to ED today.   ED Course: CT head just shows expected post-op changes.  Remainder of work up without any acute findings to explain his delirium: UA and CXR unimpressive, as is his lab work (sodium of 133 and not much else).   Review of Systems: As per HPI, otherwise all review of systems negative.  Past Medical History:  Diagnosis Date  . Anemia    borderline  . 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 Arthritis   . Broken collarbone    as a child  . Dyspnea    when climbing stairs   . GERD (gastroesophageal reflux disease)   . Hypertension   . Hypothyroidism   . Parkinson disease (HCC) 2010    Past Surgical History:  Procedure Laterality Date  . APPENDECTOMY    . arm surgery Left 1967   a rod was put in then 8 months it was removed  . CHOLECYSTECTOMY    . LAMINECTOMY  1986  . ROTATOR CUFF REPAIR Right 2001  . 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   Procedure: LEFT TOTAL SHOULDER ARTHROPLASTY;  Surgeon: Francena HanlySupple, Kevin, MD;  Location: [facility] OR;  Service: Orthopedics;  Laterality: Left;     reports that he quit smoking about 53 years ago. His smoking use included cigarettes. He has a 1.50 pack-year smoking history. He quit smokeless tobacco use about 31 years ago.  His smokeless tobacco use included chew. He  reports that he does not drink alcohol and does not use drugs.  No Known Allergies  No family history on file. No reported sick contacts.  Prior to Admission medications   Medication Sig Start Date End Date Taking? Authorizing Provider  acetaminophen (TYLENOL) 500 MG tablet Take 500 mg by mouth every 6 (six) hours as needed (for pain.).   Yes [provider]  aspirin EC 81 MG tablet Take 81 mg by mouth at bedtime.   Yes [provider]  carbidopa-levodopa (SINEMET IR) 25-100 MG tablet Take 1 tablet by mouth 4 (four) times daily.   Yes [provider]  cyclobenzaprine (FLEXERIL) 10 MG tablet Take 10 mg by mouth 3 (three) times daily as needed for muscle spasms.   Yes [provider]  docusate sodium (COLACE) 100 MG capsule Take 100 mg by mouth 3 (three) times daily.   Yes [provider]  ferrous sulfate 325 (65 FE) MG tablet Take 325 mg by mouth 2 (two) times daily.   Yes [provider]  levothyroxine (SYNTHROID, LEVOTHROID) 150 MCG tablet Take 150 mcg by mouth daily before breakfast.   Yes [provider]  lisinopril (PRINIVIL,ZESTRIL) 5 MG tablet Take 5 mg by mouth every evening.   Yes [provider]  Multiple Vitamin (MULTIVITAMIN WITH MINERALS) TABS tablet Take 1 tablet by mouth daily. Centrum  Yes [provider]  olopatadine (PATANOL) 0.1 % ophthalmic solution Place 1 drop into both eyes 2 (two) times daily as needed for allergies.   Yes [provider]  oxyCODONE (ROXICODONE) 15 MG immediate release tablet Take 15 mg by mouth 4 (four) times daily.   Yes [provider]  trihexyphenidyl (ARTANE) 2 MG tablet Take 2 mg by mouth 3 (three) times daily with meals.    Yes [provider]  zolpidem (AMBIEN CR) 12.5 MG CR tablet Take 12.5 mg by mouth at bedtime.   Yes [provider]    Physical Exam: Vitals:   06/16/20 1429 06/16/20 1609 06/16/20 1613 06/16/20 1739  BP:  113/72  120/74 130/87  Pulse: 70  64 63  Resp: 16  18 18   Temp: 98 F (36.7 C) 98.7 F (37.1 C) 98.7 F (37.1 C)   TempSrc: Oral     SpO2: 97%  100% 94%    Constitutional: NAD, calm, comfortable Eyes: PERRL, lids and conjunctivae normal ENMT: Mucous membranes are moist. Posterior pharynx clear of any exudate or lesions.Normal dentition.  Neck: normal, supple, no masses, no thyromegaly Respiratory: clear to auscultation bilaterally, no wheezing, no crackles. Normal respiratory effort. No accessory muscle use.  Cardiovascular: Regular rate and rhythm, no murmurs / rubs / gallops. No extremity edema. 2+ pedal pulses. No carotid bruits.  Abdomen: no tenderness, no masses palpated. No hepatosplenomegaly. Bowel sounds positive.  Musculoskeletal: no clubbing / cyanosis. No joint deformity upper and lower extremities. Good ROM, no contractures. Normal muscle tone.  Skin: no rashes, lesions, ulcers. No induration Neurologic: Tremor and shuffling gate Psychiatric: Confused   Labs on Admission: I have personally reviewed following labs and imaging studies  CBC: Recent Labs  Lab 06/16/20 1535  WBC 5.0  NEUTROABS 2.4  HGB 12.4*  HCT 37.9*  MCV 91.3  PLT 283   Basic Metabolic Panel: Recent Labs  Lab 06/16/20 1535  NA 133*  K 4.1  CL 96*  CO2 24  GLUCOSE 122*  BUN 18  CREATININE 0.67  CALCIUM 9.5   GFR: CrCl cannot be calculated (Unknown ideal weight.). Liver Function Tests: Recent Labs  Lab 06/16/20 1535  AST 19  ALT 5  ALKPHOS 110  BILITOT 0.7  PROT 7.4  ALBUMIN 3.8   No results for input(s): LIPASE, AMYLASE in the last 168 hours. No results for input(s): AMMONIA in the last 168 hours. Coagulation Profile: No results for input(s): INR, PROTIME in the last 168 hours. Cardiac Enzymes: No results for input(s): CKTOTAL, CKMB, CKMBINDEX, TROPONINI in the last 168 hours. BNP (last 3 results) No results for input(s): PROBNP in the last 8760 hours. HbA1C: No results  for input(s): HGBA1C in the last 72 hours. CBG: No results for input(s): GLUCAP in the last 168 hours. Lipid Profile: No results for input(s): CHOL, HDL, LDLCALC, TRIG, CHOLHDL, LDLDIRECT in the last 72 hours. Thyroid Function Tests: No results for input(s): TSH, T4TOTAL, FREET4, T3FREE, THYROIDAB in the last 72 hours. Anemia Panel: No results for input(s): VITAMINB12, FOLATE, FERRITIN, TIBC, IRON, RETICCTPCT in the last 72 hours. Urine analysis:    Component Value Date/Time   COLORURINE YELLOW 06/16/2020 1812   APPEARANCEUR CLEAR 06/16/2020 1812   LABSPEC 1.026 06/16/2020 1812   PHURINE 5.0 06/16/2020 1812   GLUCOSEU NEGATIVE 06/16/2020 1812   HGBUR NEGATIVE 06/16/2020 1812   BILIRUBINUR NEGATIVE 06/16/2020 1812   KETONESUR 20 (A) 06/16/2020 1812   PROTEINUR NEGATIVE 06/16/2020 1812   NITRITE NEGATIVE 06/16/2020 1812  LEUKOCYTESUR NEGATIVE 06/16/2020 1812    Radiological Exams on Admission: DG Chest 1 View  Result Date: 06/16/2020 CLINICAL DATA:  Delirium EXAM: CHEST  1 VIEW COMPARISON:  None. FINDINGS: Right-sided ascending stimulator generator. No acute consolidation or effusion. Normal heart size. Aortic atherosclerosis. No pneumothorax. Postsurgical changes at the right humeral head. Left shoulder replacement. IMPRESSION: No active disease. Electronically Signed   By: Jasmine Pang M.D.   On: 06/16/2020 20:29   CT Head Wo Contrast  Result Date: 06/16/2020 CLINICAL DATA:  Headache. EXAM: CT HEAD WITHOUT CONTRAST TECHNIQUE: Contiguous axial images were obtained from the base of the skull through the vertex without intravenous contrast. COMPARISON:  None. FINDINGS: Brain: Mild diffuse cortical atrophy is noted. Mild chronic ischemic white matter disease is noted. Bilateral stimulator devices are seen extending from the previously described bifrontal craniotomies with tips in the expected position of the substantia nigra bilaterally. Mild low density is seen involving the subcortical  white matter around the stimulator leads in both frontal lobes most consistent with postoperative edema. Ventricular size is within normal limits. No hemorrhage or definite acute infarction is noted. No midline shift is noted. Vascular: No hyperdense vessel or unexpected calcification. Skull: Bilateral frontal craniotomies are noted secondary to stimulator device. Sinuses/Orbits: No acute finding. Other: None. IMPRESSION: Bilateral stimulator devices are seen extending from the previously described bifrontal craniotomies with tips in the expected position of the substantia nigra bilaterally. Mild low density is seen involving the subcortical white matter around the stimulator leads in both frontal lobes most consistent with postoperative edema. No hemorrhage or definite acute infarction is noted. Mild diffuse cortical atrophy is noted. Mild chronic ischemic white matter disease is noted. Electronically Signed   By: Lupita Raider M.D.   On: 06/16/2020 16:11    EKG: Independently reviewed.  Assessment/Plan Principal Problem:   Delirium Active Problems:   Parkinson's disease (HCC)   Chronic back pain   HTN (hypertension)   Hypothyroidism    1. Delirium - 1. Delirium since deep brain stimulator implant on 7/27, progressively worsening. 2. Work up for other medical causes currently neg 3. Dr. Laurence Slate will evaluate pt to see if deep brain stimulator may be causing / contributing, etc 4. Will give a 500cc NS bolus for the very mild hyponatremia and repeat BMP in AM.  But doubt this is causing his delirium. 2. PD - 1. Continue sinemet 3. Chronic back pain - 1. Cont home meds 2. Sounds like he has been taking less of his chronic oxy recently, so doesn't sound like this is the cause of his AMS. 4. HTN - cont lisinopril 5. Hypothyroidism - 1. Cont synthroid 2. Check TSH  DVT prophylaxis: SCDs - recent CNS surgery Code Status: Full Family Communication: Wife at bedside Disposition Plan: Home  after delirium resolved Consults called: Dr. Laurence Slate Admission status: Place in obs   Addisynn Vassell M. DO [facility]  How to contact the [facility] Attending or Consulting provider 7A - 7P or covering provider during after hours 7P -7A, for this patient?  1. Check the care team in [facility] and look for a) attending/consulting [facility] provider listed and b) the [facility] team listed 2. Log into [email or web address]  Amion Physician Scheduling and messaging for groups and whole hospitals  On call and physician scheduling software for group practices, residents, hospitalists and other medical providers for call, clinic, rotation and shift schedules. On[employer] is a hospital-wide system for scheduling doctors and paging doctors on call. EasyPlot is for  scientific plotting and data analysis.  [email or web address]  and use [place]'s universal password to access. If you do not have the password, please contact the hospital operator.  3. Locate the [facility] provider you are looking for under [facility] and page to a number that you can be directly reached. 4. If you still have difficulty reaching the provider, please page the [facility] (Director on Call) for the Hospitalists listed on amion for assistance.  06/16/2020, 10:25 PM

## 2020-06-16 NOTE — ED Provider Notes (Signed)
4:20 PM-I received a telephone call from Dr. Drexel Iha (neurosurgeon in [place]), the neurosurgeon who placed his brain leads to treat Parkinson's disease.  He states that postoperatively the patient had a delirium while he was being observed, but he was felt to be stable for discharge after evaluation of that.  There has been a report that the patient has lateralizing symptoms, but that has not been verified.  Per Dr. Alycia Rossetti if the patient does not have lateralizing symptoms he will not need advanced imaging, MRI.  If he does have lateralizing symptoms patient would need to be evaluated for stroke.  An MRI would be appropriate.  His device and brain leads will need to be reprogrammed, if an MRI is to be performed.   Mancel Bale, MD 06/16/20 [phone]

## 2020-06-16 NOTE — ED Triage Notes (Signed)
Pt arrives with his wife who reports he had surgery in [place] last week, states that he has had some delirium since surgery. Pt discharged from hospital on Monday, wife states she noticed Pt also has been leaning some to the R which started on Tuesday, hold meds in mouth without swallowing them, vomiting, confusion, hallucinations. Pt will follow commands, and answer yes or no in triage, speech appears clear. Pt able to lift right arm in triage but has drift to left arm.

## 2020-06-16 NOTE — ED Notes (Signed)
Pt transported to xray 

## 2020-06-16 NOTE — ED Provider Notes (Signed)
[facility] EMERGENCY [facility]PARTMENT Provider Note   CSN: [id number] Arrival date & time: 06/16/20  1413     History Chief Complaint  Patient presents with  . Weakness    Reginald Jenkins is a 74 y.o. male.  HPI   74 year old male with confusion.  Recent admission of surgery for brain stimulator for Parkinson's disease.  Wife reports some confusion even prior to leaving the hospital.  This is progressive since discharge.  Not sleeping well.  Hallucinating.  He has not voiced any specific complaints.  Denies any significant headaches.  Past Medical History:  Diagnosis Date  . Anemia    borderline  . 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 Arthritis   . Broken collarbone    as a child  . Dyspnea    when climbing stairs   . GERD (gastroesophageal reflux disease)   . Hypertension   . Hypothyroidism   . Parkinson disease [facility](HCC) 2010    Patient Active Problem List   Diagnosis Date Noted  . 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 APPENDECTOMY    . arm surgery Left 1967   a rod was put in then 8 months it was removed  . CHOLECYSTECTOMY    . LAMINECTOMY  1986  . ROTATOR CUFF REPAIR Right 2001  . 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   Procedure: LEFT TOTAL SHOULDER ARTHROPLASTY;  Surgeon: Francena HanlySupple, Kevin, MD;  Location: [facility] OR;  Service: Orthopedics;  Laterality: Left;       No family history on file.  Social History   Tobacco Use  . Smoking status: Former Smoker    Packs/day: 0.50    Years: 3.00    Pack years: 1.50    Types: Cigarettes    Quit date: 01/15/1967    Years since quitting: 53.4  . Smokeless tobacco: Former NeurosurgeonUser    Types: Chew    Quit date: 01/14/1989  Vaping Use  . Vaping Use: Never used  Substance Use Topics  . Alcohol use: No  . Drug use: No    Home Medications Prior to Admission medications   Medication Sig Start Date End Date Taking? Authorizing Provider   acetaminophen (TYLENOL) 500 MG tablet Take 500 mg by mouth every 6 (six) hours as needed (for pain.).    [provider]  aspirin EC 81 MG tablet Take 81 mg by mouth at bedtime.    [provider]  carbidopa-levodopa (SINEMET IR) 25-100 MG tablet Take 1 tablet by mouth 4 (four) times daily.    [provider]  cyclobenzaprine (FLEXERIL) 10 MG tablet Take 10 mg by mouth 3 (three) times daily as needed for muscle spasms.    [provider]  docusate sodium (COLACE) 100 MG capsule Take 100 mg by mouth 3 (three) times daily.    [provider]  ferrous sulfate 325 (65 FE) MG tablet Take 325 mg by mouth 2 (two) times daily.    [provider]  levothyroxine (SYNTHROID, LEVOTHROID) 150 MCG tablet Take 150 mcg by mouth daily before breakfast.    [provider]  lisinopril (PRINIVIL,ZESTRIL) 5 MG tablet Take 5 mg by mouth every evening.    [provider]  Multiple Vitamin (MULTIVITAMIN WITH MINERALS) TABS tablet Take 1 tablet by mouth daily. Centrum    [provider]  naproxen (NAPROSYN) 500 MG tablet Take 1 tablet (500 mg total) by mouth 2 (  two) times daily with a meal. 01/23/18   Shuford, French Ana, PA-C  olopatadine (PATANOL) 0.1 % ophthalmic solution Place 1 drop into both eyes 2 (two) times daily as needed for allergies.    [provider]  oxyCODONE (ROXICODONE) 15 MG immediate release tablet Take 15 mg by mouth 4 (four) times daily.    [provider]  pramipexole (MIRAPEX) 1 MG tablet Take 1 mg by mouth 4 (four) times daily.    [provider]  trihexyphenidyl (ARTANE) 2 MG tablet Take 2 mg by mouth 3 (three) times daily with meals.     [provider]  zolpidem (AMBIEN CR) 12.5 MG CR tablet Take 12.5 mg by mouth at bedtime.    [provider]    Allergies    Patient has no known allergies.  Review of Systems   Review of Systems All systems reviewed and negative, other  than as noted in HPI.  Physical Exam Updated Vital Signs BP 130/87 (BP Location: Left Arm)   Pulse 63   Temp 98.7 F (37.1 C)   Resp 18   SpO2 94%   Physical Exam Vitals and nursing note reviewed.  Constitutional:      General: He is not in acute distress.    Appearance: He is well-developed.  HENT:     Head: Normocephalic.     Comments: Scalp incision stapled.  Appear clean dry and intact. Eyes:     General:        Right eye: No discharge.        Left eye: No discharge.     Conjunctiva/sclera: Conjunctivae normal.  Cardiovascular:     Rate and Rhythm: Normal rate and regular rhythm.     Heart sounds: Normal heart sounds. No murmur heard.  No friction rub. No gallop.   Pulmonary:     Effort: Pulmonary effort is normal. No respiratory distress.     Breath sounds: Normal breath sounds.  Abdominal:     General: There is no distension.     Palpations: Abdomen is soft.     Tenderness: There is no abdominal tenderness.  Musculoskeletal:        General: No tenderness.     Cervical back: Neck supple.  Skin:    General: Skin is warm and dry.  Neurological:     Mental Status: He is alert.     Comments: Oriented to self and realizes that he is in the hospital although not which one.  Disoriented to time.  Resting tremor noted in upper extremities, particularly the right consistent with Parkinson's disease.  Some rigidity in extremities but strength seems normal.  Psychiatric:        Behavior: Behavior normal.        Thought Content: Thought content normal.     ED Results / Procedures / Treatments   Labs (all labs ordered are listed, but only abnormal results are displayed) Labs Reviewed  CBC - Abnormal; Notable for the following components:      Result Value   RBC 4.15 (*)    Hemoglobin 12.4 (*)    HCT 37.9 (*)    All other components within normal limits  COMPREHENSIVE METABOLIC PANEL - Abnormal; Notable for the following components:   Sodium 133 (*)    Chloride 96  (*)    Glucose, Bld 122 (*)    All other components within normal limits  URINALYSIS, ROUTINE W REFLEX MICROSCOPIC - Abnormal; Notable for the following components:   Ketones,  ur 20 (*)    All other components within normal limits  TSH - Abnormal; Notable for the following components:   TSH 0.164 (*)    All other components within normal limits  BASIC METABOLIC PANEL - Abnormal; Notable for the following components:   Sodium 133 (*)    Chloride 97 (*)    Glucose, Bld 129 (*)    All other components within normal limits  SEDIMENTATION RATE - Abnormal; Notable for the following components:   Sed Rate 39 (*)    All other components within normal limits  C-REACTIVE PROTEIN - Abnormal; Notable for the following components:   CRP 1.7 (*)    All other components within normal limits  T4, FREE - Abnormal; Notable for the following components:   Free T4 1.85 (*)    All other components within normal limits  VITAMIN B12 - Abnormal; Notable for the following components:   Vitamin B-12 69 (*)    All other components within normal limits  SARS CORONAVIRUS 2 BY RT PCR (HOSPITAL ORDER, PERFORMED IN [place] [facility])  DIFFERENTIAL  PROTIME-INR  APTT  AMMONIA  VITAMIN B1  METHYLMALONIC ACID, SERUM  VITAMIN E  INTRINSIC FACTOR ANTIBODIES  CBG MONITORING, ED    EKG None  Radiology CT Head Wo Contrast  Result Date: 06/16/2020 CLINICAL DATA:  Headache. EXAM: CT HEAD WITHOUT CONTRAST TECHNIQUE: Contiguous axial images were obtained from the base of the skull through the vertex without intravenous contrast. COMPARISON:  None. FINDINGS: Brain: Mild diffuse cortical atrophy is noted. Mild chronic ischemic white matter disease is noted. Bilateral stimulator devices are seen extending from the previously described bifrontal craniotomies with tips in the expected position of the substantia nigra bilaterally. Mild low density is seen involving the subcortical white matter around the stimulator  leads in both frontal lobes most consistent with postoperative edema. Ventricular size is within normal limits. No hemorrhage or definite acute infarction is noted. No midline shift is noted. Vascular: No hyperdense vessel or unexpected calcification. Skull: Bilateral frontal craniotomies are noted secondary to stimulator device. Sinuses/Orbits: No acute finding. Other: None. IMPRESSION: Bilateral stimulator devices are seen extending from the previously described bifrontal craniotomies with tips in the expected position of the substantia nigra bilaterally. Mild low density is seen involving the subcortical white matter around the stimulator leads in both frontal lobes most consistent with postoperative edema. No hemorrhage or definite acute infarction is noted. Mild diffuse cortical atrophy is noted. Mild chronic ischemic white matter disease is noted. Electronically Signed   By: Lupita Raider M.D.   On: 06/16/2020 16:11    Procedures Procedures (including critical care time)  Medications Ordered in ED Medications  sodium chloride flush (NS) 0.9 % injection 3 mL (has no administration in time range)    ED Course  I have reviewed the triage vital signs and the nursing notes.  Pertinent labs & imaging results that were available during my care of the patient were reviewed by me and considered in my medical decision making (see chart for details).    MDM Rules/Calculators/A&P                         73yM with confusion s/p brain stimulator implant. This seems most consistent with delirium to me (hallucinations, lack of sleep, pacing, irritable, etc). CT head w/expected post-op changed. No acute findings. Discussed with neurosurgery resident at [facility]. No additional recommendations at this time. Requested call back if  additional questions or concerns. No fever. Has chronic back pain and a pain management provider. Wife reports he has actually been taking less pain medication that prior to his  procedure.   Final Clinical Impression(s) / ED Diagnoses Final diagnoses:  Delirium    Rx / DC Orders ED Discharge Orders    None       Raeford Razor, MD 06/19/20 [phone]

## 2020-06-16 NOTE — ED Provider Notes (Addendum)
Call for patient information from Oval Linsey, NP [facility]) approximately 1400 to Dr. Effie Shy.  Her direct contact number is [phone] extension [phone].  She called to advise patient had a deep brain stimulator with 2 leads placed for Parkinson's disease.  Patient been recommended to come to the emergency department for symptoms of nausea and left-sided weakness.  She advised patient should have CT and MRI is okay.  She provided contact for [employer] rep who is available to help via telephone for "reprogramming" of the deep brain stimulator. Alex: [phone].  Requests that the neurosurgery resident, Dr. Docia Barrier be contacted after evaluation.  Contact number provided for pager: [phone], ID# [phone]  Main attending physicians at [facility] Dr. Alan Ripper and Dr. Catheryn Bacon.  Main [phone].   Arby Barrette, MD 06/16/20 1614    Arby Barrette, MD 06/16/20 1615    Arby Barrette, MD 06/16/20 [phone]

## 2020-06-17 DIAGNOSIS — G20 Parkinson's disease: Secondary | ICD-10-CM | POA: Diagnosis present

## 2020-06-17 DIAGNOSIS — E538 Deficiency of other specified B group vitamins: Secondary | ICD-10-CM | POA: Diagnosis present

## 2020-06-17 DIAGNOSIS — G479 Sleep disorder, unspecified: Secondary | ICD-10-CM | POA: Diagnosis not present

## 2020-06-17 DIAGNOSIS — Z79899 Other long term (current) drug therapy: Secondary | ICD-10-CM | POA: Diagnosis not present

## 2020-06-17 DIAGNOSIS — Z9682 Presence of neurostimulator: Secondary | ICD-10-CM | POA: Diagnosis not present

## 2020-06-17 DIAGNOSIS — R443 Hallucinations, unspecified: Secondary | ICD-10-CM | POA: Diagnosis present

## 2020-06-17 DIAGNOSIS — Z7982 Long term (current) use of aspirin: Secondary | ICD-10-CM | POA: Diagnosis not present

## 2020-06-17 DIAGNOSIS — M545 Low back pain: Secondary | ICD-10-CM | POA: Diagnosis present

## 2020-06-17 DIAGNOSIS — R5381 Other malaise: Secondary | ICD-10-CM | POA: Diagnosis not present

## 2020-06-17 DIAGNOSIS — G936 Cerebral edema: Secondary | ICD-10-CM | POA: Diagnosis present

## 2020-06-17 DIAGNOSIS — Z791 Long term (current) use of non-steroidal anti-inflammatories (NSAID): Secondary | ICD-10-CM | POA: Diagnosis not present

## 2020-06-17 DIAGNOSIS — G9341 Metabolic encephalopathy: Secondary | ICD-10-CM | POA: Diagnosis present

## 2020-06-17 DIAGNOSIS — Z79890 Hormone replacement therapy (postmenopausal): Secondary | ICD-10-CM | POA: Diagnosis not present

## 2020-06-17 DIAGNOSIS — Z87891 Personal history of nicotine dependence: Secondary | ICD-10-CM | POA: Diagnosis not present

## 2020-06-17 DIAGNOSIS — E871 Hypo-osmolality and hyponatremia: Secondary | ICD-10-CM | POA: Diagnosis present

## 2020-06-17 DIAGNOSIS — D509 Iron deficiency anemia, unspecified: Secondary | ICD-10-CM | POA: Diagnosis present

## 2020-06-17 DIAGNOSIS — R410 Disorientation, unspecified: Secondary | ICD-10-CM | POA: Diagnosis present

## 2020-06-17 DIAGNOSIS — Z96612 Presence of left artificial shoulder joint: Secondary | ICD-10-CM | POA: Diagnosis present

## 2020-06-17 DIAGNOSIS — Z9049 Acquired absence of other specified parts of digestive tract: Secondary | ICD-10-CM | POA: Diagnosis not present

## 2020-06-17 DIAGNOSIS — K219 Gastro-esophageal reflux disease without esophagitis: Secondary | ICD-10-CM | POA: Diagnosis present

## 2020-06-17 DIAGNOSIS — G8929 Other chronic pain: Secondary | ICD-10-CM | POA: Diagnosis present

## 2020-06-17 DIAGNOSIS — I10 Essential (primary) hypertension: Secondary | ICD-10-CM | POA: Diagnosis present

## 2020-06-17 DIAGNOSIS — G9782 Other postprocedural complications and disorders of nervous system: Secondary | ICD-10-CM | POA: Diagnosis present

## 2020-06-17 DIAGNOSIS — Z20822 Contact with and (suspected) exposure to covid-19: Secondary | ICD-10-CM | POA: Diagnosis present

## 2020-06-17 DIAGNOSIS — Y838 Other surgical procedures as the cause of abnormal reaction of the patient, or of later complication, without mention of misadventure at the time of the procedure: Secondary | ICD-10-CM | POA: Diagnosis present

## 2020-06-17 DIAGNOSIS — M549 Dorsalgia, unspecified: Secondary | ICD-10-CM | POA: Diagnosis not present

## 2020-06-17 DIAGNOSIS — R471 Dysarthria and anarthria: Secondary | ICD-10-CM | POA: Diagnosis present

## 2020-06-17 DIAGNOSIS — E039 Hypothyroidism, unspecified: Secondary | ICD-10-CM | POA: Diagnosis present

## 2020-06-17 LAB — TSH: TSH: 0.164 u[IU]/mL — ABNORMAL LOW (ref 0.350–4.500)

## 2020-06-17 LAB — BASIC METABOLIC PANEL
Anion gap: 10 (ref 5–15)
BUN: 13 mg/dL (ref 8–23)
CO2: 26 mmol/L (ref 22–32)
Calcium: 9.1 mg/dL (ref 8.9–10.3)
Chloride: 97 mmol/L — ABNORMAL LOW (ref 98–111)
Creatinine, Ser: 0.73 mg/dL (ref 0.61–1.24)
GFR calc Af Amer: >60 mL/min (ref >60)
GFR calc non Af Amer: >60 mL/min (ref >60)
Glucose, Bld: 129 mg/dL — ABNORMAL HIGH (ref 70–99)
Potassium: 3.9 mmol/L (ref 3.5–5.1)
Sodium: 133 mmol/L — ABNORMAL LOW (ref 135–145)

## 2020-06-17 LAB — PROTIME-INR
INR: 1.1 (ref 0.8–1.2)
Prothrombin Time: 13.5 seconds (ref 11.4–15.2)

## 2020-06-17 LAB — APTT: aPTT: 33 seconds (ref 24–36)

## 2020-06-17 MED ORDER — DOCUSATE SODIUM 50 MG/5ML PO LIQD
100.0000 mg | Freq: Three times a day (TID) | ORAL | Status: DC
  Administered 2020-06-17 – 2020-06-21 (×13): 100 mg via ORAL
  Filled 2020-06-17 (×13): qty 10

## 2020-06-17 MED ORDER — ADULT MULTIVITAMIN LIQUID CH
15.0000 mL | Freq: Every day | ORAL | Status: DC
  Administered 2020-06-17 – 2020-06-21 (×5): 15 mL via ORAL
  Filled 2020-06-17 (×5): qty 15

## 2020-06-17 MED ORDER — ENSURE ENLIVE PO LIQD
237.0000 mL | Freq: Two times a day (BID) | ORAL | Status: DC
  Administered 2020-06-17 – 2020-06-21 (×8): 237 mL via ORAL

## 2020-06-17 MED ORDER — FERROUS SULFATE 300 (60 FE) MG/5ML PO SYRP
300.0000 mg | ORAL_SOLUTION | Freq: Two times a day (BID) | ORAL | Status: DC
  Administered 2020-06-17 – 2020-06-21 (×9): 300 mg via ORAL
  Filled 2020-06-17 (×9): qty 5

## 2020-06-17 MED ORDER — CHLORHEXIDINE GLUCONATE 0.12 % MT SOLN
15.0000 mL | Freq: Two times a day (BID) | OROMUCOSAL | Status: DC
  Administered 2020-06-17 – 2020-06-21 (×7): 15 mL via OROMUCOSAL
  Filled 2020-06-17 (×8): qty 15

## 2020-06-17 MED ORDER — ASPIRIN 81 MG PO CHEW
81.0000 mg | CHEWABLE_TABLET | Freq: Every day | ORAL | Status: DC
  Administered 2020-06-17 – 2020-06-21 (×5): 81 mg via ORAL
  Filled 2020-06-17 (×5): qty 1

## 2020-06-17 NOTE — Progress Notes (Addendum)
NEUROLOGY PROGRESS NOTE  Brief history: Reginald Jenkins is a 74 y.o. 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  CT head was performed which showed bilateral DBS electrodes placed in the substantia nigra with surrounding edema in bilateral frontal lobes around the electrodes.   Subjective: Patient is sleeping, mumbling, unable to decipher his words, able to answer "yes" or "no" intermittently to questions. Does not open his eyes to voice, continues to mumble. R hand tremor present.   ROS: unable to complete d/t AMS  Exam: Vitals:   06/17/20 0756 06/17/20 1132  BP: (!) 148/80 (!) 149/80  Pulse: 80 75  Resp: 18 18  Temp: 98.5 F (36.9 C) 98.1 F (36.7 C)  SpO2: 98% 98%    Physical Exam  Constitutional: Appears well-developed and well-nourished.  Psych: Confused, slightly agitated, somlonent Eyes: No scleral injection HENT: No OP obstrucion Head: Normocephalic. Staples in place on frontal scalp. Cardiovascular: Normal rate and regular rhythm.  Respiratory: Effort normal, non-labored breathing Skin: DBS pulse generator in place below R clavicle  Neuro:  Mental Status: Appears somnolent, not oriented, unable to hold conversation, pt mumbles during exam. Able to say "no" when asked if he was at home, unable to answer questions about self, time, location otherwise. Able to follow simple commands intermittently. Cranial Nerves: II:  Visual fields unable to assess due to sleepiness, does not keep eyes open III,IV, VI: reactive to light and accommodation, blinks to threat V,VII: UTA VIII: hearing normal bilaterally IX,X: UTA XI: UTA XII: UTA Motor: Right : Upper extremity   5/5    Left:     Upper extremity   5/5  Lower extremity   3/5     Lower extremity   5/5 Tone and bulk:normal tone  throughout; no atrophy noted R hand tremor at rest Sensory: Pinprick and light touch intact throughout, bilaterally Deep Tendon Reflexes: 2+ and symmetric throughout Plantars: Right: downgoing   Left: downgoing Cerebellar: UTA Gait: UTA   Medications: I have reviewed the patient's current medications.  Pertinent Labs/Diagnostics:  CBC    Component Value Date/Time   WBC 5.0 06/16/2020 1535   RBC 4.15 (L) 06/16/2020 1535   HGB 12.4 (L) 06/16/2020 1535   HCT 37.9 (L) 06/16/2020 1535   PLT 283 06/16/2020 1535   MCV 91.3 06/16/2020 1535   MCH 29.9 06/16/2020 1535   MCHC 32.7 06/16/2020 1535   RDW 13.1 06/16/2020 1535   LYMPHSABS 1.6 06/16/2020 1535   MONOABS 0.8 06/16/2020 1535   EOSABS 0.2 06/16/2020 1535   BASOSABS 0.0 06/16/2020 1535   BMP Latest Ref Rng & Units 06/17/2020 06/16/2020 01/14/2018  Glucose 70 - 99 mg/dL 099(I) 338(S) 505(L)  BUN 8 - 23 mg/dL 13 18 14   Creatinine 0.61 - 1.24 mg/dL 9.76 7.34  Sodium 135 - 145 mmol/L 133(L) 133(L) 137  Potassium 3.5 - 5.1 mmol/L 3.9 4.1 4.3  Chloride 98 - 111 mmol/L 97(L) 96(L) 99(L)  CO2 22 - 32 mmol/L 26 24 26   Calcium 8.9 - 10.3 mg/dL 9.1 9.5 9.1    DG Chest 1 View  Result Date: 06/16/2020 CLINICAL DATA:  Delirium EXAM: CHEST  1 VIEW COMPARISON:  None. FINDINGS: Right-sided ascending stimulator generator. No acute consolidation or effusion. Normal heart size. Aortic atherosclerosis. No pneumothorax. Postsurgical changes at the right humeral head. Left  shoulder replacement. IMPRESSION: No active disease. Electronically Signed   By: Jasmine Pang M.D.   On: 06/16/2020 20:29   CT Head Wo Contrast  Result Date: 06/16/2020 CLINICAL DATA:  Headache. EXAM: CT HEAD WITHOUT CONTRAST TECHNIQUE: Contiguous axial images were obtained from the base of the skull through the vertex without intravenous contrast. COMPARISON:  None. FINDINGS: Brain: Mild diffuse cortical atrophy is noted. Mild chronic ischemic white matter disease is noted.  Bilateral stimulator devices are seen extending from the previously described bifrontal craniotomies with tips in the expected position of the substantia nigra bilaterally. Mild low density is seen involving the subcortical white matter around the stimulator leads in both frontal lobes most consistent with postoperative edema. Ventricular size is within normal limits. No hemorrhage or definite acute infarction is noted. No midline shift is noted. Vascular: No hyperdense vessel or unexpected calcification. Skull: Bilateral frontal craniotomies are noted secondary to stimulator device. Sinuses/Orbits: No acute finding. Other: None. IMPRESSION: Bilateral stimulator devices are seen extending from the previously described bifrontal craniotomies with tips in the expected position of the substantia nigra bilaterally. Mild low density is seen involving the subcortical white matter around the stimulator leads in both frontal lobes most consistent with postoperative edema. No hemorrhage or definite acute infarction is noted. Mild diffuse cortical atrophy is noted. Mild chronic ischemic white matter disease is noted. Electronically Signed   By: Lupita Raider M.D.   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   Recommendations: 1) Continue seroquel nightly 2) minimize CNS depressants as much as possible 3) Will need MRI brain w/o contrast however DBS device and leads will need to be reprogrammed if done. - spoke to [employer] rep Trinna Post who requested we hold on MRI if able so that he can try to find a local representative as he is located in [place]. Contact info for [place]- [phone].  Floreen Comber PA-C [facility] [phone]  06/17/2020, 12:45 PM  I have seen the patient and reviewed the  above note.  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  He is able to answer simple questions and follow commands readily.  On CT, he has significant perilead edema, I do not do enough DBS to know if this is typical.  Bifrontal lesions can cause decreases in motivation and confusion, so I think this could be the explanation for his symptoms. I am not very familiar with peri-operative DBS care and whether this edema is something that would need specific treatment. I would favor discussing with neurosurgery as well.   Ritta Slot, MD [facility] [phone]  If 7pm- 7am, please page neurology on call as listed in AMION.

## 2020-06-17 NOTE — Plan of Care (Signed)
  Problem: Education: Goal: Knowledge of General Education information will improve Description: Including pain rating scale, medication(s)/side effects and non-pharmacologic comfort measures Reactivated   Problem: Health Behavior/Discharge Planning: Goal: Ability to manage health-related needs will improve Reactivated   Problem: Clinical Measurements: Goal: Ability to maintain clinical measurements within normal limits will improve Reactivated Goal: Will remain free from infection Reactivated Goal: Diagnostic test results will improve Reactivated Goal: Respiratory complications will improve Reactivated Goal: Cardiovascular complication will be avoided Reactivated   Problem: Activity: Goal: Risk for activity intolerance will decrease Reactivated   Problem: Nutrition: Goal: Adequate nutrition will be maintained Reactivated   Problem: Coping: Goal: Level of anxiety will decrease Reactivated   Problem: Elimination: Goal: Will not experience complications related to bowel motility Reactivated Goal: Will not experience complications related to urinary retention Reactivated   Problem: Pain Managment: Goal: General experience of comfort will improve Reactivated   Problem: Safety: Goal: Ability to remain free from injury will improve Reactivated   Problem: Skin Integrity: Goal: Risk for impaired skin integrity will decrease Reactivated   

## 2020-06-17 NOTE — Progress Notes (Signed)
PROGRESS NOTE    Reginald Jenkins  IAX:[id number] DOB: September 01, 1946 DOA: 06/16/2020 PCP: [name], NP   Brief Narrative:  Per Admitting MD; HPI: Reginald Jenkins is a 74 y.o. male with medical history significant of parkinson's disease.  Pt just had brain stimulator placed at [place] in [place] on 7/27.  After placement he began to develop confusion, hallucinations, lack of sleep, pacing, irritable behavior.  Despite this he was judged to be okay for discharge and sent home. While at home this past week the confusion has progressively worsened.  Wife brings him in to ED today.  ED Course: CT head just shows expected post-op changes.  Remainder of work up without any acute findings to explain his delirium: UA and CXR unimpressive, as is his lab work (sodium of 133 and not much else).   Assessment & Plan:   Principal Problem:   Delirium Active Problems:   Parkinson's disease (HCC)   Chronic back pain   HTN (hypertension)   Hypothyroidism   1. Delirium - 1. Delirium since deep brain stimulator implant on 7/27, progressively worsening. 2. Work up for other medical causes currently neg 3. Neuro saw and planning for MRI once rep to reprogram deep brain stim is located 4. Received fluid bolus in ER. 2. PD - 1. Continue sinemet 3. Chronic back pain - 1. Cont home meds 2. Sounds like he has been taking less of his chronic oxy recently, so doesn't sound like this is the cause of his AMS. 4. HTN - cont lisinopril 5. Hypothyroidism - 1. Cont synthroid 2. Check TSH  DVT prophylaxis: SCD/Compression stockings  Code Status: FULL    Code Status Orders  (From admission, onward)         Start     Ordered   06/16/20 2230  Full code  Continuous        06/16/20 2231        Code Status History    Date Active Date Inactive Code Status Order ID Comments User Context   01/22/2018 1732 01/23/2018 1530 Full Code [id number]  Ralene Bathe, PA-C Inpatient   Advance Care Planning Activity     Advance Directive Documentation     Most Recent Value  Type of Advance Directive Healthcare Power of Attorney, Living will  Pre-existing out of facility DNR order (yellow form or pink MOST form) --  "MOST" Form in Place? --     Family Communication: WIFE AT BEDSIDE  Disposition Plan:   Status is: Inpatient  Remains inpatient appropriate because:Altered mental status   Dispo: The patient is from: Home              Anticipated d/c is to: UNKNOWN              Anticipated d/c date is: > 3 days              Patient currently is not medically stable to d/c.       Consults called: None Admission status: Inpatient   Consultants:   NEURO  Procedures:  DG Chest 1 View  Result Date: 06/16/2020 CLINICAL DATA:  Delirium EXAM: CHEST  1 VIEW COMPARISON:  None. FINDINGS: Right-sided ascending stimulator generator. No acute consolidation or effusion. Normal heart size. Aortic atherosclerosis. No pneumothorax. Postsurgical changes at the right humeral head. Left shoulder replacement. IMPRESSION: No active disease. Electronically Signed   By: Jasmine Pang M.D.   On: 06/16/2020 20:29   CT Head Wo Contrast  Result Date:  06/16/2020 CLINICAL DATA:  Headache. EXAM: CT HEAD WITHOUT CONTRAST TECHNIQUE: Contiguous axial images were obtained from the base of the skull through the vertex without intravenous contrast. COMPARISON:  None. FINDINGS: Brain: Mild diffuse cortical atrophy is noted. Mild chronic ischemic white matter disease is noted. Bilateral stimulator devices are seen extending from the previously described bifrontal craniotomies with tips in the expected position of the substantia nigra bilaterally. Mild low density is seen involving the subcortical white matter around the stimulator leads in both frontal lobes most consistent with postoperative edema. Ventricular size is within normal limits. No hemorrhage or definite acute infarction is noted. No midline shift is noted. Vascular: No  hyperdense vessel or unexpected calcification. Skull: Bilateral frontal craniotomies are noted secondary to stimulator device. Sinuses/Orbits: No acute finding. Other: None. IMPRESSION: Bilateral stimulator devices are seen extending from the previously described bifrontal craniotomies with tips in the expected position of the substantia nigra bilaterally. Mild low density is seen involving the subcortical white matter around the stimulator leads in both frontal lobes most consistent with postoperative edema. No hemorrhage or definite acute infarction is noted. Mild diffuse cortical atrophy is noted. Mild chronic ischemic white matter disease is noted. Electronically Signed   By: Lupita Raider M.D.   On: 06/16/2020 16:11     Antimicrobials:   NONE    Subjective: Pt still very confused Wife at bedside  Objective: Vitals:   06/17/20 0334 06/17/20 0600 06/17/20 0756 06/17/20 1132  BP: 123/61  (!) 148/80 (!) 149/80  Pulse: 83  80 75  Resp: 16  18 18   Temp: 98.3 F (36.8 C)  98.5 F (36.9 C) 98.1 F (36.7 C)  TempSrc: Oral  Axillary Axillary  SpO2: 99%  98% 98%  Weight:  77.1 kg    Height:  5' 7.5" (1.715 m)      Intake/Output Summary (Last 24 hours) at 06/17/2020 1614 Last data filed at 06/17/2020 1300 Gross per 24 hour  Intake 230 ml  Output --  Net 230 ml   Filed Weights   06/17/20 0600  Weight: 77.1 kg    Examination:  General exam: Confused, awake, staples cranium without s/s of infx Respiratory system: Clear to auscultation. Respiratory effort normal. Cardiovascular system: S1 & S2 heard, RRR. No JVD, murmurs, rubs, gallops or clicks. No pedal edema. Gastrointestinal system: Abdomen is nondistended, soft and nontender. No organomegaly or masses felt. Normal bowel sounds heard. Central nervous system: awake, confused. Extremities: wwp, moving all 4 ext freely and randomly Skin: No rashes, lesions or ulcers Psychiatry: Judgement and insight not intact-very poor with  AMD    Data Reviewed: I have personally reviewed following labs and imaging studies  CBC: Recent Labs  Lab 06/16/20 1535  WBC 5.0  NEUTROABS 2.4  HGB 12.4*  HCT 37.9*  MCV 91.3  PLT 283   Basic Metabolic Panel: Recent Labs  Lab 06/16/20 1535 06/17/20 0247  NA 133* 133*  K 4.1 3.9  CL 96* 97*  CO2 24 26  GLUCOSE 122* 129*  BUN 18 13  CREATININE 0.67 0.73  CALCIUM 9.5 9.1   GFR: Estimated Creatinine Clearance: 79.5 mL/min (by C-G formula based on SCr of 0.73 mg/dL). Liver Function Tests: Recent Labs  Lab 06/16/20 1535  AST 19  ALT 5  ALKPHOS 110  BILITOT 0.7  PROT 7.4  ALBUMIN 3.8   No results for input(s): LIPASE, AMYLASE in the last 168 hours. No results for input(s): AMMONIA in the last 168 hours. Coagulation Profile:  Recent Labs  Lab 06/17/20 0247  INR 1.1   Cardiac Enzymes: No results for input(s): CKTOTAL, CKMB, CKMBINDEX, TROPONINI in the last 168 hours. BNP (last 3 results) No results for input(s): PROBNP in the last 8760 hours. HbA1C: No results for input(s): HGBA1C in the last 72 hours. CBG: No results for input(s): GLUCAP in the last 168 hours. Lipid Profile: No results for input(s): CHOL, HDL, LDLCALC, TRIG, CHOLHDL, LDLDIRECT in the last 72 hours. Thyroid Function Tests: Recent Labs    06/17/20 0247  TSH 0.164*   Anemia Panel: No results for input(s): VITAMINB12, FOLATE, FERRITIN, TIBC, IRON, RETICCTPCT in the last 72 hours. Sepsis Labs: No results for input(s): PROCALCITON, LATICACIDVEN in the last 168 hours.  Recent Results (from the past 240 hour(s))  SARS Coronavirus 2 by RT PCR (hospital order, [facility]) Nasopharyngeal Nasopharyngeal Swab     Status: None   Collection Time: 06/16/20  7:17 PM   Specimen: Nasopharyngeal Swab  Result Value Ref Range Status   SARS Coronavirus 2 NEGATIVE NEGATIVE Final    Comment: (NOTE) SARS-CoV-2 target nucleic acids are NOT DETECTED.  The SARS-CoV-2 RNA is  generally detectable in upper and lower respiratory specimens during the acute phase of infection. The lowest concentration of SARS-CoV-2 viral copies this assay can detect is 250 copies / mL. A negative result does not preclude SARS-CoV-2 infection and should not be used as the sole basis for treatment or other patient management decisions.  A negative result may occur with improper specimen collection / handling, submission of specimen other than nasopharyngeal swab, presence of viral mutation(s) within the areas targeted by this assay, and inadequate number of viral copies (<250 copies / mL). A negative result must be combined with clinical observations, patient history, and epidemiological information.  Fact Sheet for Patients:   BoilerBrush.com.cyhttps://www.fda.gov/media/136312/download  Fact Sheet for Healthcare Providers: https://pope.com/https://www.fda.gov/media/136313/download  This test is not yet approved or  cleared by the [place] FDA and has been authorized for detection and/or diagnosis of SARS-CoV-2 by FDA under an Emergency Use Authorization (EUA).  This EUA will remain in effect (meaning this test can be used) for the duration of the COVID-19 declaration under Section 564(b)(1) of the Act, 21 U.S.C. section 360bbb-3(b)(1), unless the authorization is terminated or revoked sooner.  Performed at [facility], [address] [place], [place] [address]          Radiology Studies: DG Chest 1 View  Result Date: 06/16/2020 CLINICAL DATA:  Delirium EXAM: CHEST  1 VIEW COMPARISON:  None. FINDINGS: Right-sided ascending stimulator generator. No acute consolidation or effusion. Normal heart size. Aortic atherosclerosis. No pneumothorax. Postsurgical changes at the right humeral head. Left shoulder replacement. IMPRESSION: No active disease. Electronically Signed   By: Jasmine PangKim  Fujinaga M.D.   On: 06/16/2020 20:29   CT Head Wo Contrast  Result Date: 06/16/2020 CLINICAL DATA:  Headache. EXAM: CT  HEAD WITHOUT CONTRAST TECHNIQUE: Contiguous axial images were obtained from the base of the skull through the vertex without intravenous contrast. COMPARISON:  None. FINDINGS: Brain: Mild diffuse cortical atrophy is noted. Mild chronic ischemic white matter disease is noted. Bilateral stimulator devices are seen extending from the previously described bifrontal craniotomies with tips in the expected position of the substantia nigra bilaterally. Mild low density is seen involving the subcortical white matter around the stimulator leads in both frontal lobes most consistent with postoperative edema. Ventricular size is within normal limits. No hemorrhage or definite acute infarction  is noted. No midline shift is noted. Vascular: No hyperdense vessel or unexpected calcification. Skull: Bilateral frontal craniotomies are noted secondary to stimulator device. Sinuses/Orbits: No acute finding. Other: None. IMPRESSION: Bilateral stimulator devices are seen extending from the previously described bifrontal craniotomies with tips in the expected position of the substantia nigra bilaterally. Mild low density is seen involving the subcortical white matter around the stimulator leads in both frontal lobes most consistent with postoperative edema. No hemorrhage or definite acute infarction is noted. Mild diffuse cortical atrophy is noted. Mild chronic ischemic white matter disease is noted. Electronically Signed   By: Lupita Raider M.D.   On: 06/16/2020 16:11        Scheduled Meds: . aspirin  81 mg Oral Daily  . carbidopa-levodopa  1 tablet Oral QID  . chlorhexidine  15 mL Mouth/Throat BID  . docusate  100 mg Oral TID  . feeding supplement (ENSURE ENLIVE)  237 mL Oral BID BM  . ferrous sulfate  300 mg Oral BID WC  . levothyroxine  150 mcg Oral QAC breakfast  . lisinopril  5 mg Oral QPM  . multivitamin  15 mL Oral Daily  . oxyCODONE  15 mg Oral QID  . QUEtiapine  25 mg Oral QHS  . trihexyphenidyl  2 mg Oral  TID WC   Continuous Infusions:   LOS: 0 days    Time spent: 32 MIN    Burke Keels, MD [facility]  If 7PM-7AM, please contact night-coverage  06/17/2020, 4:14 PM

## 2020-06-17 NOTE — Consult Note (Addendum)
Requesting Physician: Dr. Julian Reil    Chief Complaint: Confusion post surgery  History obtained from: Patient and Chart    HPI:                                                                                                                                       Reginald Jenkins is a 74 y.o. 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  According to the wife, patient was quite active with no dementia.  Underwent DBS surgery at [facility] and postop was confused.  Was discharged anyway.  However over the last few days patient has been getting increasingly confused and agitated and she brought him to [facility] that this was closer to their home.  Patient on Sinemet, Artane and as well as Ambien which she has been taking chronically.  No new changes in medications according to the wife.  CT head was performed which showed bilateral DBS electrodes placed in the substantia nigra with surrounding edema in bilateral frontal lobes around the electrodes.     Past Medical History:  Diagnosis Date  . Anemia    borderline  . 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 Arthritis   . Broken collarbone    as a child  . Dyspnea    when climbing stairs   . GERD (gastroesophageal reflux disease)   . Hypertension   . Hypothyroidism   . Parkinson disease (HCC) 2010    Past Surgical History:  Procedure Laterality Date  . APPENDECTOMY    . arm surgery Left 1967   a rod was put in then 8 months it was removed  . CHOLECYSTECTOMY    . LAMINECTOMY  1986  . ROTATOR CUFF REPAIR Right 2001  . 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   Procedure: LEFT TOTAL SHOULDER ARTHROPLASTY;  Surgeon: Francena Hanly, MD;  Location: [facility] OR;  Service: Orthopedics;  Laterality: Left;    No family history on  file. Social History:  reports that he quit smoking about 53 years ago. His smoking use included cigarettes. He has a 1.50 pack-year smoking history. He quit smokeless tobacco use about 31 years ago.  His smokeless tobacco use included chew. He reports that he does not drink alcohol and does not use drugs.  Allergies: No Known Allergies  Medications:  I reviewed home medications   ROS:                                                                                                                                     14 systems reviewed and negative except above    Examination:                                                                                                      General: Appears well-developed and well-nourished.  Psych: Affect appropriate to situation Eyes: No scleral injection HENT: No OP obstrucion Head: Normocephalic.  Cardiovascular: Normal rate and regular rhythm.  Respiratory: Effort normal and breath sounds normal to anterior ascultation GI: Soft.  No distension. There is no tenderness.  Skin: WDI    Neurological Examination Mental Status: Alert, not oriented to time or place or reason why he is in the hospital.  Easily distractible.  Speech fluent without evidence of aphasia.  Able to follow simple commands. Cranial Nerves: II: Visual fields grossly normal,  III,IV, VI: ptosis not present, extra-ocular motions intact bilaterally, pupils equal, round, reactive to light and accommodation V,VII: smile symmetric, facial light touch sensation normal bilaterally VIII: hearing normal bilaterally IX,X: uvula rises symmetrically XI: bilateral shoulder shrug XII: midline tongue extension Motor: Right : Upper extremity   5/5    Left:     Upper extremity   5/5  Lower extremity   5/5     Lower extremity   5/5 Tone and bulk: Increased tone more so on  the right than the left, tremors in the right arm Sensory: Pinprick and light touch intact throughout, bilaterally Plantars: Right: downgoing   Left: downgoing Cerebellar: normal finger-to-nose,  Gait: Not assessed     Lab Results: Basic Metabolic Panel: Recent Labs  Lab 06/16/20 1535  NA 133*  K 4.1  CL 96*  CO2 24  GLUCOSE 122*  BUN 18  CREATININE 0.67  CALCIUM 9.5    CBC: Recent Labs  Lab 06/16/20 1535  WBC 5.0  NEUTROABS 2.4  HGB 12.4*  HCT 37.9*  MCV 91.3  PLT 283    Coagulation Studies: No results for input(s): LABPROT, INR in the last 72 hours.  Imaging: DG Chest 1 View  Result Date: 06/16/2020 CLINICAL DATA:  Delirium EXAM: CHEST  1 VIEW COMPARISON:  None. FINDINGS: Right-sided ascending stimulator generator. No acute consolidation or effusion. Normal heart size. Aortic atherosclerosis. No pneumothorax. Postsurgical changes at the right humeral head. Left shoulder replacement. IMPRESSION:  No active disease. Electronically Signed   By: Jasmine Pang M.D.   On: 06/16/2020 20:29   CT Head Wo Contrast  Result Date: 06/16/2020 CLINICAL DATA:  Headache. EXAM: CT HEAD WITHOUT CONTRAST TECHNIQUE: Contiguous axial images were obtained from the base of the skull through the vertex without intravenous contrast. COMPARISON:  None. FINDINGS: Brain: Mild diffuse cortical atrophy is noted. Mild chronic ischemic white matter disease is noted. Bilateral stimulator devices are seen extending from the previously described bifrontal craniotomies with tips in the expected position of the substantia nigra bilaterally. Mild low density is seen involving the subcortical white matter around the stimulator leads in both frontal lobes most consistent with postoperative edema. Ventricular size is within normal limits. No hemorrhage or definite acute infarction is noted. No midline shift is noted. Vascular: No hyperdense vessel or unexpected calcification. Skull: Bilateral frontal  craniotomies are noted secondary to stimulator device. Sinuses/Orbits: No acute finding. Other: None. IMPRESSION: Bilateral stimulator devices are seen extending from the previously described bifrontal craniotomies with tips in the expected position of the substantia nigra bilaterally. Mild low density is seen involving the subcortical white matter around the stimulator leads in both frontal lobes most consistent with postoperative edema. No hemorrhage or definite acute infarction is noted. Mild diffuse cortical atrophy is noted. Mild chronic ischemic white matter disease is noted. Electronically Signed   By: Lupita Raider M.D.   On: 06/16/2020 16:11     I have reviewed the above imaging : Reviewed CT head   ASSESSMENT AND PLAN  74 year old male with past medical history of Parkinson's disease without dementia, status post bilateral deep brain stimulator to sepsis nigra presents with 1 week of confusion and agitation post surgery.  Acute metabolic encephalopathy  Recommendations Start Seroquel 25 mg daily nightly Check B1 level  Reduced dose of Ambien  Ideally would like to obtain MRI brain, however likely unable to be done due to recent electrode placement Minimize opiates as much as possible    Gianina Olinde [facility] Pager Number [phone]

## 2020-06-17 NOTE — Progress Notes (Signed)
OT Cancellation Note  Patient Details Name: Reginald Jenkins MRN: [id number] DOB: 12/08/1945   Cancelled Treatment:    Reason Eval/Treat Not Completed: Fatigue/lethargy limiting ability to participate.  As per PT note, pt fatigued. Will check back.  Eber Jones., OTR/L Acute Rehabilitation Services Pager [phone] Office [phone]   Jeani Hawking M 06/17/2020, 9:32 AM

## 2020-06-17 NOTE — Progress Notes (Signed)
PT Cancellation Note  Patient Details Name: Reginald Jenkins MRN: [id number] DOB: Nov 10, 1946   Cancelled Treatment:    Reason Eval/Treat Not Completed: Fatigue/lethargy limiting ability to participate. Attempted PT eval this morning and nurse asking for PT to check back later as pt very fatigued and sleeping right now. Will initiate PT evaluation when able.    [name] 06/17/2020, 8:29 AM   Conni Slipper, PT, DPT Acute Rehabilitation Services Pager: [phone] Office: [phone]

## 2020-06-17 NOTE — Evaluation (Signed)
Physical Therapy Evaluation Patient Details Name: Reginald Jenkins MRN: [id number] DOB: 1946-08-24 Today's Date: 06/17/2020   History of Present Illness  Pt is a 74 y/o male with a PMH significant for Parkinson's Disease without dementia s/p deep brain stimulator placement 1 week ago. Since then, pt with increased confusion and agitation. CT revealed edema in bilateral frontal lobes around the electrodes from DBS.   Clinical Impression  Pt admitted with above diagnosis. At the time of PT eval pt was able to perform transfers and ambulation with up to +2 min assist, mainly for safety as this was an initial evaluation. Pt with a decline in function and new cognitive deficits since his DBS placement. Based on performance today, recommend follow-up therapy at the CIR level to maximize functional independence, increase tolerance for functional activity, and decrease risk for falls. Pt currently with functional limitations due to the deficits listed below (see PT Problem List). Pt will benefit from skilled PT to increase their independence and safety with mobility to allow discharge to the venue listed below.       Follow Up Recommendations CIR;Supervision/Assistance - 24 hour    Equipment Recommendations  None recommended by PT    Recommendations for Other Services       Precautions / Restrictions Precautions Precautions: Fall Precaution Comments: (P) wife denies fall history  Restrictions Weight Bearing Restrictions: No      Mobility  Bed Mobility Overal bed mobility: Needs Assistance Bed Mobility: Supine to Sit     Supine to sit: Supervision     General bed mobility comments: Increased time. Pt utilizing momentum and initiated movement, however took a while to suddenly complete the transition to sitting upright. Again utilizing momentum and increased time required to get feet on the floor.   Transfers Overall transfer level: Needs assistance Equipment used: 2 person hand held  assist Transfers: Sit to/from Stand Sit to Stand: Min assist;+2 safety/equipment         General transfer comment: Balance support provided for full power-up to full standing position. VC's for eyes open and to maintain standing.   Ambulation/Gait Ambulation/Gait assistance: Min assist;+2 safety/equipment Gait Distance (Feet): 175 Feet Assistive device: 2 person hand held assist Gait Pattern/deviations: Shuffle;Festinating;Trunk flexed;Narrow base of support Gait velocity: Decreased Gait velocity interpretation: <1.31 ft/sec, indicative of household ambulator General Gait Details: Multimodal cues required for long/big steps. Pt occasionally getting stuck, however more frequently as distance progressed. Suspect fatigue despite pt denying he was getting tired.   Stairs            Wheelchair Mobility    Modified Rankin (Stroke Patients Only)       Balance Overall balance assessment: Needs assistance Sitting-balance support: Feet supported;No upper extremity supported Sitting balance-Leahy Scale: Fair     Standing balance support: No upper extremity supported;During functional activity Standing balance-Leahy Scale: Fair                               Pertinent Vitals/Pain Pain Assessment: (P) Faces Faces Pain Scale: No hurt Pain Intervention(s): Monitored during session    Home Living Family/patient expects to be discharged to:: Private residence Living Arrangements: Spouse/significant other Available Help at Discharge: Family;Available 24 hours/day Type of Home: House Home Access: Ramped entrance (in back)     Home Layout: One level Home Equipment: Shower seat - built in Engineering geologist. User may not have seen previous data.) Additional Comments: (P) [facility] reports [facility]  [facility] and she is having someone install grab bars in the bathroom     Prior Function Level of Independence: (P) Independent   Gait / Transfers  Assistance Needed: (P) Not using an AD PTA           Hand Dominance   Dominant Hand: (P) Right    Extremity/Trunk Assessment   Upper Extremity Assessment Upper Extremity Assessment: RUE deficits/detail;LUE deficits/detail RUE Deficits / Details: tremor noted which wife reports is worse than baseline.  He demonstrates full AROM   RUE Coordination: decreased fine motor;decreased gross motor LUE Deficits / Details: full AROM noted.  Significant motor planning deficits noted  LUE Coordination: decreased gross motor;decreased fine motor    Lower Extremity Assessment Lower Extremity Assessment: Defer to PT evaluation    Cervical / Trunk Assessment Cervical / Trunk Assessment: (P) Other exceptions Cervical / Trunk Exceptions: (P) flexed trunk with capital extension   Communication   Communication: Expressive difficulties  Cognition Arousal/Alertness: Awake/alert Behavior During Therapy: Flat affect Overall Cognitive Status: Impaired/Different from baseline Area of Impairment: Orientation;Attention;Following commands;Problem solving                 Orientation Level: Disoriented to;Time;Situation;Place Current Attention Level: Focused   Following Commands: Follows one step commands inconsistently;Follows one step commands with increased time     Problem Solving: Slow processing;Decreased initiation;Difficulty sequencing;Requires verbal cues;Requires tactile cues General Comments: Pt keeps eyes closed majority of the time, but will open them intermittently on command.  He will respond to questions with simple responses on occasion, but this decreased as the session progressed.  He demonstrates a significant delay in processing and initiation - up to 90-120".  He follows simple one step motor commands ~60% of the time with moderate verbal and tactile cues. Significant motor planning deficits also noted       General Comments      Exercises     Assessment/Plan    PT  Assessment Patient needs continued PT services  PT Problem List Decreased strength;Decreased activity tolerance;Decreased balance;Decreased mobility;Decreased cognition;Decreased knowledge of use of DME;Decreased safety awareness;Decreased knowledge of precautions       PT Treatment Interventions DME instruction;Gait training;Therapeutic activities;Functional mobility training;Therapeutic exercise;Cognitive remediation;Patient/family education;Neuromuscular re-education    PT Goals (Current goals can be found in the Care Plan section)  Acute Rehab PT Goals Patient Stated Goal: None stated - wife is hopeful for return home, for cognition to clear, and pt return to PLOF PT Goal Formulation: With family Time For Goal Achievement: 07/01/20 Potential to Achieve Goals: Good    Frequency Min 3X/week   Barriers to discharge        Co-evaluation PT/OT/SLP Co-Evaluation/Treatment: Yes Reason for Co-Treatment: Complexity of the patient's impairments (multi-system involvement);Necessary to address cognition/behavior during functional activity;For patient/therapist safety;To address functional/ADL transfers PT goals addressed during session: Mobility/safety with mobility;Balance OT goals addressed during session: ADL's and self-care       AM-PAC PT "6 Clicks" Mobility  Outcome Measure Help needed turning from your back to your side while in a flat bed without using bedrails?: None Help needed moving from lying on your back to sitting on the side of a flat bed without using bedrails?: None Help needed moving to and from a bed to a chair (including a wheelchair)?: A Little Help needed standing up from a chair using your arms (e.g., wheelchair or bedside chair)?: A Little Help needed to walk in hospital room?: A Little Help needed  climbing 3-5 steps with a railing? : A Little 6 Click Score: 20    End of Session Equipment Utilized During Treatment: Gait belt Activity Tolerance: Patient limited  by fatigue Patient left: in chair;with call bell/phone within reach;with chair alarm set;with family/visitor present;Other (comment) (MD present) Nurse Communication: Mobility status PT Visit Diagnosis: Difficulty in walking, not elsewhere classified (R26.2);Other abnormalities of gait and mobility (R26.89)    Time: 1359-1436 PT Time Calculation (min) (ACUTE ONLY): 37 min   Charges:   PT Evaluation $PT Eval Moderate Complexity: 1 Mod          Conni Slipper, PT, DPT Acute Rehabilitation Services Pager: [phone] Office: [phone]   [name] 06/17/2020, 3:29 PM

## 2020-06-17 NOTE — Evaluation (Signed)
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 Since then, pt with increased confusion and agitation. CT revealed edema in bilateral frontal lobes around the electrodes from DBS.    Clinical Impression   Pt admitted with above. He demonstrates the below listed deficits and will benefit from continued OT to maximize safety and independence with BADLs.  Pt seen in conjunction with PT.  Pt was able to move from supine to EOB with supervision and a significant amount of time to motor plan and initiate movement.  He was able to ambulate in hallway with min A+2, but was unable to brush teeth with hand over hand assist, or perform other ADL tasks with the exception of washing Rt side of face with mod A to initiate activity.        Follow Up Recommendations  CIR;Supervision/Assistance - 24 hour    Equipment Recommendations  None recommended by OT    Recommendations for Other Services Rehab consult     Precautions / Restrictions Precautions Precautions: Fall Precaution Comments: wife denies fall history  Restrictions Weight Bearing Restrictions: No      Mobility Bed Mobility Overal bed mobility: Needs Assistance Bed Mobility: Supine to Sit     Supine to sit: Supervision     General bed mobility comments: Increased time. Pt utilizing momentum and initiated movement, however took a while to suddenly complete the transition to sitting upright. Again utilizing momentum and increased time required to get feet on the floor.   Transfers Overall transfer level: Needs assistance Equipment used: 2 person hand held assist Transfers: Sit to/from Stand Sit to Stand: Min assist;+2 safety/equipment         General transfer comment: Balance support  provided for full power-up to full standing position. VC's for eyes open and to maintain standing.     Balance Overall balance assessment: Needs assistance Sitting-balance support: Feet supported;No upper extremity supported Sitting balance-Leahy Scale: Fair     Standing balance support: No upper extremity supported;During functional activity Standing balance-Leahy Scale: Fair                             ADL either performed or assessed with clinical judgement   ADL Overall ADL's : Needs assistance/impaired Eating/Feeding: Total assistance;Sitting   Grooming: Wash/dry hands;Wash/dry face;Oral care;Total assistance;Standing Grooming Details (indicate cue type and reason): attempted to engage pt in brushing teeth, however, he was unable to inititiate or sequence task despite hand over hand assist.  He was able to wipe the Lt side of his face with mod A to initiate task  Upper Body Bathing: Total assistance;Sitting   Lower Body Bathing: Total assistance;Sit to/from stand   Upper Body Dressing : Total assistance;Sitting   Lower Body Dressing: Total assistance;Sit to/from stand   Toilet Transfer: +2 for safety/equipment;+2 for physical assistance;Ambulation;Comfort height toilet;Moderate assistance Toilet Transfer Details (indicate cue type and reason): min A to ambulate into BR (simulated); mod A to initiate sitting  Toileting- Clothing Manipulation and Hygiene: Total assistance;Sit to/from stand       Functional mobility during ADLs: Minimal assistance;+2 for safety/equipment;+2 for physical assistance       Vision Baseline Vision/History: No visual deficits Additional Comments: pt unable to provide info Re: current visual  functioning.  He keeps eyes closed the majority of the time      Perception Perception Perception Tested?: No   Praxis Praxis Praxis tested?: Deficits Deficits: Initiation;Motor Impersistence;Limb apraxia;Ideomotor    Pertinent Vitals/Pain  Pain Assessment: Faces Faces Pain Scale: No hurt Pain Intervention(s): Monitored during session     Hand Dominance Right   Extremity/Trunk Assessment Upper Extremity Assessment Upper Extremity Assessment: RUE deficits/detail;LUE deficits/detail RUE Deficits / Details: tremor noted which wife reports is worse than baseline.  He demonstrates full AROM   RUE Coordination: decreased fine motor;decreased gross motor LUE Deficits / Details: full AROM noted.  Significant motor planning deficits noted  LUE Coordination: decreased gross motor;decreased fine motor   Lower Extremity Assessment Lower Extremity Assessment: Defer to PT evaluation   Cervical / Trunk Assessment Cervical / Trunk Assessment: Other exceptions Cervical / Trunk Exceptions: flexed trunk with capital extension    Communication Communication Communication: Expressive difficulties   Cognition Arousal/Alertness: Awake/alert Behavior During Therapy: Flat affect Overall Cognitive Status: Impaired/Different from baseline Area of Impairment: Orientation;Attention;Following commands;Problem solving                 Orientation Level: Disoriented to;Time;Situation;Place Current Attention Level: Focused   Following Commands: Follows one step commands inconsistently;Follows one step commands with increased time     Problem Solving: Slow processing;Decreased initiation;Difficulty sequencing;Requires verbal cues;Requires tactile cues General Comments: Pt keeps eyes closed majority of the time, but will open them intermittently on command.  He will respond to questions with simple responses on occasion, but this decreased as the session progressed.  He demonstrates a significant delay in processing and initiation - up to 90-120".  He follows simple one step motor commands ~60% of the time with moderate verbal and tactile cues. Significant motor planning deficits also noted    General Comments  wife present     Exercises  Exercises: Other exercises Other Exercises Other Exercises: wife encouraged to allow and assist pt with simple activities like washing face and drinking from a cup.  Also encouraged her to have him perform shoulder flexion, arm circles, punches, and seated marching    Shoulder Instructions      Home Living Family/patient expects to be discharged to:: Private residence Living Arrangements: Spouse/significant other Available Help at Discharge: Family;Available 24 hours/day Type of Home: House Home Access: Ramped entrance (in back)     Home Layout: One level     Bathroom Shower/Tub: Producer, television/film/video: Handicapped height     Home Equipment: Shower seat - built in Engineering geologist. User may not have seen previous data.)   Additional Comments: wife reports that the [place] is getting them a shower seat and she is having someone install grab bars in the bathroom       Prior Functioning/Environment Level of Independence: Independent  Gait / Transfers Assistance Needed: Not using an AD PTA              OT Problem List: Decreased strength;Decreased activity tolerance;Impaired balance (sitting and/or standing);Impaired vision/perception;Decreased coordination;Decreased safety awareness;Decreased cognition;Decreased knowledge of use of DME or AE;Impaired tone;Impaired UE functional use      OT Treatment/Interventions: Self-care/ADL training;Neuromuscular education;DME and/or AE instruction;Manual therapy;Therapeutic activities;Cognitive remediation/compensation;Visual/perceptual remediation/compensation;Patient/family education;Balance training    OT Goals(Current goals can be found in the care plan section) Acute Rehab OT Goals Patient Stated Goal: for him to be able to take care of himself  OT Goal Formulation: With patient/family Time For Goal Achievement: 07/01/20 Potential to Achieve Goals:  Good ADL Goals Pt Will Perform Eating: with mod assist;sitting;with  adaptive utensils Pt Will Perform Grooming: with mod assist;standing Pt Will Perform Upper Body Bathing: with mod assist;sitting Pt Will Perform Lower Body Bathing: with mod assist;sit to/from stand Pt Will Transfer to Toilet: with min guard assist;ambulating;regular height toilet;bedside commode;grab bars Additional ADL Goal #1: pt will be able to initiate simple ADL tasks 50% of the time with min cues  OT Frequency: Min 2X/week   Barriers to D/C:    unsure if wife is able to provide necessary level of assist at discharge given pt's current level of functioning        Co-evaluation PT/OT/SLP Co-Evaluation/Treatment: Yes Reason for Co-Treatment: Complexity of the patient's impairments (multi-system involvement);Necessary to address cognition/behavior during functional activity;For patient/therapist safety;To address functional/ADL transfers PT goals addressed during session: Mobility/safety with mobility;Balance OT goals addressed during session: ADL's and self-care      AM-PAC OT "6 Clicks" Daily Activity     Outcome Measure Help from another person eating meals?: Total Help from another person taking care of personal grooming?: Total Help from another person toileting, which includes using toliet, bedpan, or urinal?: Total Help from another person bathing (including washing, rinsing, drying)?: Total Help from another person to put on and taking off regular upper body clothing?: Total Help from another person to put on and taking off regular lower body clothing?: Total 6 Click Score: 6   End of Session Equipment Utilized During Treatment: Gait belt Nurse Communication: Mobility status  Activity Tolerance: Patient tolerated treatment well Patient left: in chair;with call bell/phone within reach;with chair alarm set;with family/visitor present  OT Visit Diagnosis: Unsteadiness on feet (R26.81);Cognitive communication deficit (R41.841)                Time: 9675-9163 OT Time  Calculation (min): 38 min Charges:  OT General Charges $OT Visit: 1 Visit OT Evaluation $OT Eval Moderate Complexity: 1 Mod  Eber Jones., OTR/L Acute Rehabilitation Services Pager [phone] Office [phone]   Jeani Hawking M 06/17/2020, 3:42 PM

## 2020-06-18 LAB — T4, FREE: Free T4: 1.85 ng/dL — ABNORMAL HIGH (ref 0.61–1.12)

## 2020-06-18 LAB — VITAMIN B12: Vitamin B-12: 69 pg/mL — ABNORMAL LOW (ref 180–914)

## 2020-06-18 LAB — C-REACTIVE PROTEIN: CRP: 1.7 mg/dL — ABNORMAL HIGH (ref <1.0)

## 2020-06-18 LAB — SEDIMENTATION RATE: Sed Rate: 39 mm/hr — ABNORMAL HIGH (ref 0–16)

## 2020-06-18 LAB — AMMONIA: Ammonia: 18 umol/L (ref 9–35)

## 2020-06-18 MED ORDER — POLYETHYLENE GLYCOL 3350 17 G PO PACK
17.0000 g | PACK | Freq: Every day | ORAL | Status: DC | PRN
  Administered 2020-06-18 – 2020-06-20 (×3): 17 g via ORAL
  Filled 2020-06-18 (×3): qty 1

## 2020-06-18 MED ORDER — CYANOCOBALAMIN 1000 MCG/ML IJ SOLN
1000.0000 ug | Freq: Every day | INTRAMUSCULAR | Status: DC
  Administered 2020-06-18 – 2020-06-21 (×4): 1000 ug via INTRAMUSCULAR
  Filled 2020-06-18 (×4): qty 1

## 2020-06-18 MED ORDER — QUETIAPINE FUMARATE 25 MG PO TABS
12.5000 mg | ORAL_TABLET | Freq: Every day | ORAL | Status: DC
  Administered 2020-06-18: 12.5 mg via ORAL
  Filled 2020-06-18: qty 1

## 2020-06-18 NOTE — Progress Notes (Signed)
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 Since then, pt with increased confusion and agitation. CT revealed edema in bilateral frontal lobes around the electrodes from DBS.     PT Comments    Pt progressing towards physical therapy goals. Was able to demonstrate improved cognition this session - oriented x3 but continued to require increased time to answer questions. Pt oriented to situation but unable to restate. Pt answering questions about wife quickly - name, how long they've been married, etc. Pt demonstrating improved motor planning this session, but required multimodal cues and min assist for tasks such as self-feeding. Continue to recommend CIR level therapies at d/c to maximize functional independence and decrease burden of care on wife. Will continue to follow and progress as able per POC.     Follow Up Recommendations  CIR;Supervision/Assistance - 24 hour     Equipment Recommendations  None recommended by PT    Recommendations for Other Services       Precautions / Restrictions Precautions Precautions: Fall Precaution Comments: wife denies fall history  Restrictions Weight Bearing Restrictions: No    Mobility  Bed Mobility Overal bed mobility: Needs Assistance Bed Mobility: Supine to Sit     Supine to sit: Min assist     General bed mobility comments: Assist to initiate movement but pt able to transition to EOB largely without assist. To quicken the process assist provided for pt to pull himself fully out to EOB to get feet on the floor.   Transfers Overall transfer level: Needs assistance Equipment used: None Transfers: Sit to/from Stand Sit to Stand: Min guard         General transfer comment: Pt was able to power  up to full stand without assistance. Close hands on guard provided for safety but no unsteadiness noted.   Ambulation/Gait Ambulation/Gait assistance: Min assist Gait Distance (Feet): 200 Feet Assistive device: 1 person hand held assist Gait Pattern/deviations: Shuffle;Festinating;Trunk flexed;Narrow base of support Gait velocity: Decreased Gait velocity interpretation: <1.31 ft/sec, indicative of household ambulator General Gait Details: VC's for long/big steps more frequent towards end of gait training due to fatigue. Pt initally ambulating fairly well with HHA - do not feel he needed it for balance however pt reaching out and trying to put hand around therapist's waist, so opted for HHA instead.    Stairs             Wheelchair Mobility    Modified Rankin (Stroke Patients Only)       Balance Overall balance assessment: Needs assistance Sitting-balance support: Feet supported;No upper extremity supported Sitting balance-Leahy Scale: Fair Sitting balance - Comments: anterior lean/trunk flexion noted as pt sat in the chair for longer period.    Standing balance support: No upper extremity supported;During functional activity Standing balance-Leahy Scale: Fair Standing balance comment: As pt fatigued, poor                            Cognition Arousal/Alertness: Awake/alert Behavior During Therapy: Flat affect Overall Cognitive Status: Impaired/Different from baseline Area of Impairment: Orientation;Attention;Memory;Following commands;Problem solving                 Orientation Level: Disoriented to;Situation Current Attention Level: Focused Memory: Decreased short-term memory  Following Commands: Follows one step commands inconsistently;Follows one step commands with increased time     Problem Solving: Slow processing;Decreased initiation;Difficulty sequencing;Requires verbal cues;Requires tactile cues        Exercises      General Comments         Pertinent Vitals/Pain Pain Assessment: Faces Faces Pain Scale: No hurt Pain Intervention(s): Monitored during session    Home Living                      Prior Function            PT Goals (current goals can now be found in the care plan section) Acute Rehab PT Goals Patient Stated Goal: for him to be able to take care of himself  PT Goal Formulation: Patient unable to participate in goal setting Time For Goal Achievement: 07/01/20 Potential to Achieve Goals: Good Progress towards PT goals: Progressing toward goals    Frequency    Min 3X/week      PT Plan Current plan remains appropriate    Co-evaluation              AM-PAC PT "6 Clicks" Mobility   Outcome Measure  Help needed turning from your back to your side while in a flat bed without using bedrails?: None Help needed moving from lying on your back to sitting on the side of a flat bed without using bedrails?: A Little Help needed moving to and from a bed to a chair (including a wheelchair)?: A Little Help needed standing up from a chair using your arms (e.g., wheelchair or bedside chair)?: A Little Help needed to walk in hospital room?: A Little Help needed climbing 3-5 steps with a railing? : A Little 6 Click Score: 19    End of Session Equipment Utilized During Treatment: Gait belt Activity Tolerance: Patient tolerated treatment well Patient left: in chair;with call bell/phone within reach;with chair alarm set Nurse Communication: Mobility status PT Visit Diagnosis: Difficulty in walking, not elsewhere classified (R26.2);Other abnormalities of gait and mobility (R26.89)     Time: 4270-6237 PT Time Calculation (min) (ACUTE ONLY): 28 min  Charges:  $Gait Training: 8-22 mins $Self Care/Home Management: 8-22                     Conni Slipper, PT, DPT Acute Rehabilitation Services Pager: [phone] Office: [phone]    [name] 06/18/2020, 8:56 AM

## 2020-06-18 NOTE — Progress Notes (Signed)
PROGRESS NOTE    Reginald Jenkins  TML:[id number] DOB: 07/19/1946 DOA: 06/16/2020 PCP: [name], NP   Brief Narrative:  Per Admitting MD; KCL:EXNTZG F Gentryis a 74 y.o.malewith medical history significant ofparkinson's disease.  Pt just had brain stimulator placed at [place] in [place] on 7/27. After placement he began to develop confusion, hallucinations, lack of sleep, pacing, irritable behavior. Despite this he was judged to be okay for discharge and sent home. While at home this past week the confusion has progressively worsened. Wife brings him in to ED today.  ED Course:CT head just shows expected post-op changes. Remainder of work up without any acute findings to explain his delirium: UA and CXR unimpressive, as is his lab work (sodium of 133 and not much else).   Assessment & Plan:   Principal Problem:   Delirium Active Problems:   Parkinson's disease (HCC)   Chronic back pain   HTN (hypertension)   Hypothyroidism   1. Delirium - 1. Delirium since deep brain stimulator implant on 7/27, progressively worsening. 2. Work up for other medical causes currently neg 3. Neuro saw and planning for MRI once rep to reprogram deep brain stim is located 4. NSU consult 8/8, agrees with plans for MRI 5. Received fluid bolus in ER. 2. PD - 1. Continue sinemet 3. Chronic back pain - 1. Cont home meds 2. Sounds like he has been taking less of his chronic oxy recently, so doesn't sound like this is the cause of his AMS. 4. HTN - cont lisinopril 5. Hypothyroidism - 1. Cont synthroid 2. Check TSH  DVT prophylaxis: SCD/Compression stockings  Code Status: full    Code Status Orders  (From admission, onward)         Start     Ordered   06/16/20 2230  Full code  Continuous        06/16/20 2231        Code Status History    Date Active Date Inactive Code Status Order ID Comments User Context   01/22/2018 1732 01/23/2018 1530 Full Code [id number]  Ralene Bathe,  PA-C Inpatient   Advance Care Planning Activity    Advance Directive Documentation     Most Recent Value  Type of Advance Directive Healthcare Power of Attorney, Living will  Pre-existing out of facility DNR order (yellow form or pink MOST form) --  "MOST" Form in Place? --     Family Communication: wife at bedside  Disposition Plan:   Status is: Inpatient  Remains inpatient appropriate because:Ongoing diagnostic testing needed not appropriate for outpatient work up and Inpatient level of care appropriate due to severity of illness   Dispo: The patient is from: Home              Anticipated d/c is to: Home              Anticipated d/c date is: 3 days              Patient currently is not medically stable to d/c.       Consults called: NSU, NEURO Admission status: Inpatient   Consultants:   AS ABOVE  Procedures:  DG Chest 1 View  Result Date: 06/16/2020 CLINICAL DATA:  Delirium EXAM: CHEST  1 VIEW COMPARISON:  None. FINDINGS: Right-sided ascending stimulator generator. No acute consolidation or effusion. Normal heart size. Aortic atherosclerosis. No pneumothorax. Postsurgical changes at the right humeral head. Left shoulder replacement. IMPRESSION: No active disease. Electronically Signed   By:  Jasmine PangKim  Fujinaga M.D.   On: 06/16/2020 20:29   CT Head Wo Contrast  Result Date: 06/16/2020 CLINICAL DATA:  Headache. EXAM: CT HEAD WITHOUT CONTRAST TECHNIQUE: Contiguous axial images were obtained from the base of the skull through the vertex without intravenous contrast. COMPARISON:  None. FINDINGS: Brain: Mild diffuse cortical atrophy is noted. Mild chronic ischemic white matter disease is noted. Bilateral stimulator devices are seen extending from the previously described bifrontal craniotomies with tips in the expected position of the substantia nigra bilaterally. Mild low density is seen involving the subcortical white matter around the stimulator leads in both frontal lobes most  consistent with postoperative edema. Ventricular size is within normal limits. No hemorrhage or definite acute infarction is noted. No midline shift is noted. Vascular: No hyperdense vessel or unexpected calcification. Skull: Bilateral frontal craniotomies are noted secondary to stimulator device. Sinuses/Orbits: No acute finding. Other: None. IMPRESSION: Bilateral stimulator devices are seen extending from the previously described bifrontal craniotomies with tips in the expected position of the substantia nigra bilaterally. Mild low density is seen involving the subcortical white matter around the stimulator leads in both frontal lobes most consistent with postoperative edema. No hemorrhage or definite acute infarction is noted. Mild diffuse cortical atrophy is noted. Mild chronic ischemic white matter disease is noted. Electronically Signed   By: Lupita RaiderJames  Green Jr M.D.   On: 06/16/2020 16:11     Antimicrobials:   NONE   Subjective: Still confused and slowly conversant but improved from yesterday   Objective: Vitals:   06/18/20 0504 06/18/20 0507 06/18/20 0731 06/18/20 1241  BP:  100/65 (!) 151/83 117/71  Pulse: (!) 52 (!) 56 88 72  Resp:  20 20 20   Temp:  98 F (36.7 C) 97.8 F (36.6 C) 99.1 F (37.3 C)  TempSrc:  Axillary Axillary Oral  SpO2: 98% 97% 95% 98%  Weight:      Height:        Intake/Output Summary (Last 24 hours) at 06/18/2020 1311 Last data filed at 06/18/2020 0900 Gross per 24 hour  Intake 360 ml  Output 530 ml  Net -170 ml   Filed Weights   06/17/20 0600  Weight: 77.1 kg    Examination:  General exam: Confused, awake, staples cranium still without s/s of infx Respiratory system: Clear to auscultation. Respiratory effort normal. Cardiovascular system: S1 & S2 heard, RRR. No JVD, murmurs, rubs, gallops or clicks. No pedal edema. Gastrointestinal system: Abdomen is nondistended, soft and nontender. No organomegaly or masses felt. Normal bowel sounds  heard. Central nervous system: awake, confused. Extremities: wwp, moving all 4 ext freely and randomly Skin: No rashes, lesions or ulcers Psychiatry: Judgement and insight not intact-very poor with  AMS    Data Reviewed: I have personally reviewed following labs and imaging studies  CBC: Recent Labs  Lab 06/16/20 1535  WBC 5.0  NEUTROABS 2.4  HGB 12.4*  HCT 37.9*  MCV 91.3  PLT 283   Basic Metabolic Panel: Recent Labs  Lab 06/16/20 1535 06/17/20 0247  NA 133* 133*  K 4.1 3.9  CL 96* 97*  CO2 24 26  GLUCOSE 122* 129*  BUN 18 13  CREATININE 0.67 0.73  CALCIUM 9.5 9.1   GFR: Estimated Creatinine Clearance: 79.5 mL/min (by C-G formula based on SCr of 0.73 mg/dL). Liver Function Tests: Recent Labs  Lab 06/16/20 1535  AST 19  ALT 5  ALKPHOS 110  BILITOT 0.7  PROT 7.4  ALBUMIN 3.8   No results for  input(s): LIPASE, AMYLASE in the last 168 hours. No results for input(s): AMMONIA in the last 168 hours. Coagulation Profile: Recent Labs  Lab 06/17/20 0247  INR 1.1   Cardiac Enzymes: No results for input(s): CKTOTAL, CKMB, CKMBINDEX, TROPONINI in the last 168 hours. BNP (last 3 results) No results for input(s): PROBNP in the last 8760 hours. HbA1C: No results for input(s): HGBA1C in the last 72 hours. CBG: No results for input(s): GLUCAP in the last 168 hours. Lipid Profile: No results for input(s): CHOL, HDL, LDLCALC, TRIG, CHOLHDL, LDLDIRECT in the last 72 hours. Thyroid Function Tests: Recent Labs    06/17/20 0247 06/18/20 1121  TSH 0.164*  --   FREET4  --  1.85*   Anemia Panel: Recent Labs    06/18/20 1121  VITAMINB12 69*   Sepsis Labs: No results for input(s): PROCALCITON, LATICACIDVEN in the last 168 hours.  Recent Results (from the past 240 hour(s))  SARS Coronavirus 2 by RT PCR (hospital order, performed in [facility] hospital lab) Nasopharyngeal Nasopharyngeal Swab     Status: None   Collection Time: 06/16/20  7:17 PM   Specimen:  Nasopharyngeal Swab  Result Value Ref Range Status   SARS Coronavirus 2 NEGATIVE NEGATIVE Final    Comment: (NOTE) SARS-CoV-2 target nucleic acids are NOT DETECTED.  The SARS-CoV-2 RNA is generally detectable in upper and lower respiratory specimens during the acute phase of infection. The lowest concentration of SARS-CoV-2 viral copies this assay can detect is 250 copies / mL. A negative result does not preclude SARS-CoV-2 infection and should not be used as the sole basis for treatment or other patient management decisions.  A negative result may occur with improper specimen collection / handling, submission of specimen other than nasopharyngeal swab, presence of viral mutation(s) within the areas targeted by this assay, and inadequate number of viral copies (<250 copies / mL). A negative result must be combined with clinical observations, patient history, and epidemiological information.  Fact Sheet for Patients:   BoilerBrush.com.cy  Fact Sheet for Healthcare Providers: https://pope.com/  This test is not yet approved or  cleared by the [place] FDA and has been authorized for detection and/or diagnosis of SARS-CoV-2 by FDA under an Emergency Use Authorization (EUA).  This EUA will remain in effect (meaning this test can be used) for the duration of the COVID-19 declaration under Section 564(b)(1) of the Act, 21 U.S.C. section 360bbb-3(b)(1), unless the authorization is terminated or revoked sooner.  Performed at [facility], [address] [place], [place] [address]          Radiology Studies: DG Chest 1 View  Result Date: 06/16/2020 CLINICAL DATA:  Delirium EXAM: CHEST  1 VIEW COMPARISON:  None. FINDINGS: Right-sided ascending stimulator generator. No acute consolidation or effusion. Normal heart size. Aortic atherosclerosis. No pneumothorax. Postsurgical changes at the right humeral head. Left shoulder  replacement. IMPRESSION: No active disease. Electronically Signed   By: Jasmine Pang M.D.   On: 06/16/2020 20:29   CT Head Wo Contrast  Result Date: 06/16/2020 CLINICAL DATA:  Headache. EXAM: CT HEAD WITHOUT CONTRAST TECHNIQUE: Contiguous axial images were obtained from the base of the skull through the vertex without intravenous contrast. COMPARISON:  None. FINDINGS: Brain: Mild diffuse cortical atrophy is noted. Mild chronic ischemic white matter disease is noted. Bilateral stimulator devices are seen extending from the previously described bifrontal craniotomies with tips in the expected position of the substantia nigra bilaterally. Mild low density is seen involving the  subcortical white matter around the stimulator leads in both frontal lobes most consistent with postoperative edema. Ventricular size is within normal limits. No hemorrhage or definite acute infarction is noted. No midline shift is noted. Vascular: No hyperdense vessel or unexpected calcification. Skull: Bilateral frontal craniotomies are noted secondary to stimulator device. Sinuses/Orbits: No acute finding. Other: None. IMPRESSION: Bilateral stimulator devices are seen extending from the previously described bifrontal craniotomies with tips in the expected position of the substantia nigra bilaterally. Mild low density is seen involving the subcortical white matter around the stimulator leads in both frontal lobes most consistent with postoperative edema. No hemorrhage or definite acute infarction is noted. Mild diffuse cortical atrophy is noted. Mild chronic ischemic white matter disease is noted. Electronically Signed   By: Lupita Raider M.D.   On: 06/16/2020 16:11        Scheduled Meds: . aspirin  81 mg Oral Daily  . carbidopa-levodopa  1 tablet Oral QID  . chlorhexidine  15 mL Mouth/Throat BID  . docusate  100 mg Oral TID  . feeding supplement (ENSURE ENLIVE)  237 mL Oral BID BM  . ferrous sulfate  300 mg Oral BID WC  .  levothyroxine  150 mcg Oral QAC breakfast  . lisinopril  5 mg Oral QPM  . multivitamin  15 mL Oral Daily  . oxyCODONE  15 mg Oral QID  . QUEtiapine  25 mg Oral QHS  . trihexyphenidyl  2 mg Oral TID WC   Continuous Infusions:   LOS: 1 day    Time spent: 64 MIN    Burke Keels, MD [facility]  If 7PM-7AM, please contact night-coverage  06/18/2020, 1:11 PM

## 2020-06-18 NOTE — Progress Notes (Signed)
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 Since then, pt with increased confusion and agitation. CT revealed edema in bilateral frontal lobes around the electrodes from DBS.    OT comments  Pt. Seen for skilled OT treatment session.  Pt. Able to complete bed mobility min a.  Grooming task in standing with mod a including verbal and tactile cues.  toileting with min a and max cues for sequencing.  Verbal response and task initiation and completion delays still present but keeping eyes open for longer periods of time this day along with answering questions more consistently.      Follow Up Recommendations  CIR;Supervision/Assistance - 24 hour    Equipment Recommendations  None recommended by OT    Recommendations for Other Services Rehab consult    Precautions / Restrictions Precautions Precautions: Fall Precaution Comments: wife denies fall history  Restrictions Weight Bearing Restrictions: No       Mobility Bed Mobility Overal bed mobility: Needs Assistance Bed Mobility: Supine to Sit     Supine to sit: Min assist     General bed mobility comments: Assist to initiate movement but pt able to transition to EOB largely without assist. To quicken the process assist provided for pt to pull himself fully out to EOB to get feet on the floor.   Transfers Overall transfer level: Needs assistance Equipment used: None Transfers: Sit to/from [employer] Sit to Stand: Min guard Stand pivot transfers: Min assist       General transfer comment: Pt was able to power up to full stand without assistance. Close hands on guard provided for safety but no unsteadiness noted.     Balance Overall balance assessment: Needs  assistance Sitting-balance support: Feet supported;No upper extremity supported Sitting balance-Leahy Scale: Fair Sitting balance - Comments: anterior lean/trunk flexion noted as pt sat in the chair for longer period.    Standing balance support: No upper extremity supported;During functional activity Standing balance-Leahy Scale: Fair Standing balance comment: As pt fatigued, poor                           ADL either performed or assessed with clinical judgement   ADL Overall ADL's : Needs assistance/impaired     Grooming: Wash/dry hands;Moderate assistance;Standing Grooming Details (indicate cue type and reason): able to initiate reaching for soap, unable to determine if he wanted 3x of soap or unable to transition to next step of hand washing. physical assistance required to move L hand and bring to R hand to begin hand washing.  able to lather and move hands together in washing motion.  assistance for drying.  offered face washing and oral care pt. replied "no"                 Toilet Transfer: Min guard;Minimal assistance;Cueing for sequencing;Stand-pivot;Regular Toilet;BSC;Grab bars Toilet Transfer Details (indicate cue type and reason): intermittent furniture walking utilized from sink and grab bars while entering the b.room.  max cues to take backwards steps to the toilet before sitting down. Toileting- Clothing Manipulation and Hygiene: Moderate assistance;Sit to/from stand Toileting - Clothing Manipulation Details (indicate cue type and reason): reports his wife usually assists with undergarments but used R hand to assist with pulling up  right side of brief after urinating       General ADL Comments: assisting with toileting and grooming tasks. noted improvement from yesterdays session     Vision       Perception     Praxis      Cognition Arousal/Alertness: Awake/alert Behavior During Therapy: Flat affect Overall Cognitive Status: Impaired/Different  from baseline Area of Impairment: Orientation;Attention;Memory;Following commands;Problem solving                 Orientation Level: Disoriented to;Situation Current Attention Level: Focused Memory: Decreased short-term memory Following Commands: Follows one step commands inconsistently;Follows one step commands with increased time     Problem Solving: Slow processing;Decreased initiation;Difficulty sequencing;Requires verbal cues;Requires tactile cues General Comments: keeping eyes open for longer periods of time today.  answering most questions today but with significant delay and often having the question repeated.  initiation noted with simple grooming task but with perserveration and min/mod physical and verbal guidance to progress the task or action.        Exercises     Shoulder Instructions       General Comments      Pertinent Vitals/ Pain       Pain Assessment: Faces Faces Pain Scale: No hurt Pain Intervention(s): Monitored during session  Home Living                                          Prior Functioning/Environment              Frequency  Min 2X/week        Progress Toward Goals  OT Goals(current goals can now be found in the care plan section)  Progress towards OT goals: Progressing toward goals  Acute Rehab OT Goals Patient Stated Goal: for him to be able to take care of himself   Plan      Co-evaluation                 AM-PAC OT "6 Clicks" Daily Activity     Outcome Measure   Help from another person eating meals?: A Lot Help from another person taking care of personal grooming?: A Lot Help from another person toileting, which includes using toliet, bedpan, or urinal?: A Lot Help from another person bathing (including washing, rinsing, drying)?: A Lot Help from another person to put on and taking off regular upper body clothing?: A Lot Help from another person to put on and taking off regular lower body  clothing?: A Lot 6 Click Score: 12    End of Session Equipment Utilized During Treatment: Gait belt  OT Visit Diagnosis: Unsteadiness on feet (R26.81);Cognitive communication deficit (R41.841)   Activity Tolerance Patient tolerated treatment well   Patient Left Other (comment) (left with PT for PT session)   Nurse Communication          Time: 1517-6160 OT Time Calculation (min): 12 min  Charges: OT General Charges $OT Visit: 1 Visit OT Treatments $Self Care/Home Management : 8-22 mins  Reginald Jenkins, Reginald Jenkins Acute Rehabilitation [phone]   Robet Leu 06/18/2020, 9:12 AM

## 2020-06-18 NOTE — Consult Note (Signed)
   Providing Compassionate, Quality Care - Together  Neurosurgery Consult  Referring physician: Dr. Elray Mcgregor Reason for referral: DBS leads/edema  Chief Complaint: confusion  History of Present Illness: This is a 74 year old male with a history of Parkinson's disease, status post placement of bilateral STN DBS leads on 7/27 in the [place] in [place].  Since surgery has had confusion, hallucinations difficulty sleeping and irritable behavior.  His confusion progressed and therefore he was brought to the ED by his wife.  At this time at bedside he is slowly conversant but denies any headache numbness tingling or weakness.  Denies any fevers.   Medications: I have reviewed the patient's current medications. Allergies: No Known Allergies  History reviewed. No pertinent family history. Social History:  has no history on file for tobacco use, alcohol use, and drug use.  ROS: 14 point review of systems was obtained which all pertinent positives and negatives are listed HPI above.  Physical Exam:  Vital signs in last 24 hours: Temp:  [98 F (36.7 C)-98.3 F (36.8 C)] 98 F (36.7 C) (07/25 1814) Pulse Rate:  [58-128] 65 (07/26 0746) Resp:  [11-18] 14 (07/26 0217) BP: (138-182)/(65-125) 153/88 (07/26 0700) SpO2:  [91 %-98 %] 96 % (07/26 0746) PE: Awake alert oriented x1 Normocephalic atraumatic Nonlabored breathing Face symmetric PERRLA, EOMI Multiple incisions in the bilateral frontal region and right parietal region appear to be healing well.  No erythema, no fluctuance no tenderness to palpation. Moving all extremities equally Sensory intact light touch throughout Right upper chest incision appears to be healing well, no tenderness, no fluctuation, no erythema   Impression/Assessment:  74 year old male with 1.  Parkinson's disease -Status post bilateral STN DBS 7-27 and at outside facility 2.  Encephalopathy, unknown etiology  Plan:  -Recommend MRI of the brain with and without  contrast as planned -CT brain reviewed, shows some bilateral frontal edema around the DBS leads otherwise no acute findings.  This edema is likely postoperative but an MRI would help delineate if there is any infection. -Once infection is ruled out could benefit from low-dose steroids -ESR and CRP lab work pending -No acute neurosurgical intervention at this time.  We will follow up once imaging is completed   Thank you for allowing me to participate in this patient's care.  Please do not hesitate to call with questions or concerns.   Reginald Jenkins, [facility] Cell: [phone]

## 2020-06-18 NOTE — Progress Notes (Signed)
Inpatient Rehab Admissions Coordinator Note:   Per therapy recommendations, pt was screened for CIR candidacy by Kajuana Shareef, MS CCC-SLP. At this time, Pt. Appears to have functional decline and is a good candidate for CIR. Will place order for rehab consult per protocol.  Please contact me with questions.   Alle Difabio, MS, CCC-SLP Rehab Admissions Coordinator  [phone] (celll) [phone] (office)  

## 2020-06-18 NOTE — Progress Notes (Addendum)
NEUROLOGY PROGRESS NOTE   Brief history: Reginald Jenkins is a 74 y.o. 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  CT head was performed which showed bilateral DBS electrodes placed in the substantia nigra with surrounding edema in bilateral frontal lobes around the electrodes.   Subjective/Interval history: [name] mental status is much improved this morning, that he is not at baseline per his wife who is at bedside.  At times he still responds with word salad type of speech though they are intelligible words, thus his self-report is not reliable. He has had some low heart rates and blood pressure has been labile but this has been asymptomatic. He worked with PT/OT and was able to ambulate well with them which his wife is very happy about. She also reports that he does reliably report his pain and has been having worsening of his chronic lower back pain due to being in the hospital, in the hospital bed and chair.   Exam: Vitals:   06/18/20 0507 06/18/20 0731  BP: 100/65 (!) 151/83  Pulse: (!) 56 88  Resp: 20 20  Temp: 98 F (36.7 C) 97.8 F (36.6 C)  SpO2: 97% 95%    Physical Exam  Constitutional: Appears well-developed and well-nourished.  Psych: Confused, affect appropriate, smiles at times Eyes: No scleral injection HENT: No OP obstrucion Head: Normocephalic. Staples in place on frontal scalp, healing well Cardiovascular: Perfusing extremities well Respiratory: Effort normal, non-labored breathing Abdomen: non-distended Extremities: well perfused, no significant edema Skin: DBS pulse generator in place below R clavicle  Neuro:  Mental Status: Able to hold a conversation, though at times he becomes perseverative on prior answers, able to follow simple commands, able to correctly state  that his wife is at bedside and that they have been married for 52 years (she corrects that it has been 53).  Unable to recall that today is his daughter Reginald Jenkins birthday, unable to include that today is August 8 despite multiple reminders.   Cranial Nerves: II: Grossly intact III,IV, VI: External ocular muscles intact VII: Masked facies, but symmetric smile VIII: hearing normal bilaterally IX,X: UTA XI: UTA XII: UTA Motor: Tone and bulk: Mild cogwheeling tremor and increased tone R hand tremor at rest Sensory: Pinprick and light touch intact throughout, bilaterally   Medications: I have reviewed the patient's current medications, he has been placed on a reduced dose of Ambien and continues on his home oxycodone.  Pertinent Labs/Diagnostics:  CB Mild hyponatremia (133) Low TSH (0.164), free T4 1.85 (elevated) B1 pending B12 low at 69  DG Chest 1 View  Result Date: 06/16/2020 CLINICAL DATA:  Delirium EXAM: CHEST  1 VIEW COMPARISON:  None. FINDINGS: Right-sided ascending stimulator generator. No acute consolidation or effusion. Normal heart size. Aortic atherosclerosis. No pneumothorax. Postsurgical changes at the right humeral head. Left shoulder replacement. IMPRESSION: No active disease. Electronically Signed   By: Jasmine Pang M.D.   On: 06/16/2020 20:29   CT Head Wo Contrast  Result Date: 06/16/2020 CLINICAL DATA:  Headache. EXAM: CT HEAD WITHOUT CONTRAST TECHNIQUE: Contiguous axial images were obtained from the base of the skull through the vertex without intravenous contrast. COMPARISON:  None. FINDINGS: Brain: Mild diffuse cortical atrophy is noted. Mild chronic ischemic white matter disease is noted. Bilateral stimulator devices are seen extending from the previously described bifrontal  craniotomies with tips in the expected position of the substantia nigra bilaterally. Mild low density is seen involving the subcortical white matter around the stimulator leads in both frontal lobes  most consistent with postoperative edema. Ventricular size is within normal limits. No hemorrhage or definite acute infarction is noted. No midline shift is noted. Vascular: No hyperdense vessel or unexpected calcification. Skull: Bilateral frontal craniotomies are noted secondary to stimulator device. Sinuses/Orbits: No acute finding. Other: None. IMPRESSION: Bilateral stimulator devices are seen extending from the previously described bifrontal craniotomies with tips in the expected position of the substantia nigra bilaterally. Mild low density is seen involving the subcortical white matter around the stimulator leads in both frontal lobes most consistent with postoperative edema. No hemorrhage or definite acute infarction is noted. Mild diffuse cortical atrophy is noted. Mild chronic ischemic white matter disease is noted. Electronically Signed   By: Lupita Raider M.D.   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 The patient's confusion, inattention (unable to repeat days of the week backwards), and waxing / waning mental status is most suggestive of delirium, polypharmacy (home med list includes: oxycodone, flexeril, ambien, artane and sinemet though these are chronic meds with no recent med changes), mild recent brain injury (edema secondary to DBS lead placement), hospitalization, and likely disrupted sleep cycle (sleeping during the day).  Notably, his B12 level has resulted at less than 100 and exacerbation of B12 deficiency in the setting of nitrous oxide for anesthesia as well described.  Thus, this metabolic derangement may be culprit given the time course of his symptoms, though certainly his peri-lead edema is also significant.   Alternative explanations for symptoms could include subclinical  seizures or stroke which are less likely though could be a possible procedural complication; MRI brain would be useful for assessing for stroke if we are able to safely do this in the setting of the DBS leads. No signs of meningitis / encephalitis  in vital signs, exam, or imaging.   Recommendations: # hyponatremia  - please continue to monitor hyponatremia to confirm stability; workup per primary team - follow-up methylmalonic acid, Vit E, B1   # hypothyroidism - TSH low at 0.164 and Free t4 elevated - levothyroxine adjustment if needed per primary team  # B12 deficiency - B12 1000 mcg IM daily x 7 days then 1000 mcg oral daily - IF antibodies  # cerebral edema - appreciate neurosurgery recommendations - If he worsens again, will consider routine EEG to rule out seizure  - MRI brain w/o contrast (DBS device and leads will need to be reprogrammed if done; prior member of our team spoke to [employer] rep Alex who requested we hold on MRI if able so that he can try to find a local representative as he is located in [place]. Contact info for [facility] [phone]).  # Further Delirium / Encephalopathy workup/management - reduce Seroquel to 12.5 nightly, plan to discontinue prior to discharge Additionally, please follow these delirium precautions:   - try to minimize deliriogenic medications as much as possible (J Am Geriatr Soc. 2012 Apr;60(4):616-31): benzodiazepines, anticholinergics, diphenhydramine, antihistamines, narcotics, Ambien/Lunesta/Sonata etc.  - home Ambien dose is 12.5, reduced to 5 mg here  - continues on home oxy 15 mg QID scheduled, if he worsens, would reduce this next - environmental support for delirium: Lights  on during the day, patient up and out of bed as much as is feasible, OT/PT, quiet dimly lit room at night, reorient patient often, provide hearing aides and glasses if patient uses them routinely, minimize sleep disruptions as much as possible overnight.  As much as  possible, reorient patient, and have them engage patient in activities, e.g. playing cards. TV should be off or on neutral background music unless patient engaged and watching. Try to keep interactions with the patient calm and quiet.     06/18/2020, 9:45 AM  Brooke Dare MD-PhD [facility] [phone]    If 7pm- 7am, please page neurology on call as listed in AMION.  Addended for charge capture

## 2020-06-19 ENCOUNTER — Inpatient Hospital Stay (HOSPITAL_COMMUNITY): Payer: No Typology Code available for payment source

## 2020-06-19 LAB — INTRINSIC FACTOR ANTIBODIES: Intrinsic Factor: 1.1 AU/mL (ref 0.0–1.1)

## 2020-06-19 MED ORDER — LEVOTHYROXINE SODIUM 25 MCG PO TABS
125.0000 ug | ORAL_TABLET | Freq: Every day | ORAL | Status: DC
  Administered 2020-06-20 – 2020-06-21 (×2): 125 ug via ORAL
  Filled 2020-06-19 (×2): qty 1

## 2020-06-19 NOTE — Progress Notes (Addendum)
NEUROLOGY PROGRESS NOTE   Brief history: Reginald Jenkins is a 74 y.o. 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  CT head was performed which showed bilateral DBS electrodes placed in the substantia nigra with surrounding edema in bilateral frontal lobes around the electrodes.   Subjective/Interval history: [name] mental status continues to improve.  Her tells me that he is having some back pain but not having any headache.  He is more and more conversational.  He is tolerating the reduction in Seroquel well and still slept well last night  Exam: Vitals:   06/19/20 0414 06/19/20 0815  BP: 115/61 102/72  Pulse: (!) 59 91  Resp: 18 18  Temp: 97.7 F (36.5 C) 99 F (37.2 C)  SpO2: 100% 100%    Physical Exam  Constitutional: Appears well-developed and well-nourished.  Psych: Improving ability to follow conversation, affect appropriate though does have masked facies, smiles at times Eyes: No scleral injection HENT: No OP obstrucion Head: Normocephalic. Staples in place on frontal scalp, healing well Cardiovascular: Perfusing extremities well Respiratory: Effort normal, non-labored breathing Abdomen: non-distended Extremities: well perfused, no significant edema Skin: DBS pulse generator in place below R clavicle  Neuro:  Mental Status: Able to follow more complex commands, able to name and repeat oriented to self, Reginald [facility] hospital, but not year or month Cranial Nerves: II: Grossly intact III,IV, VI: External ocular muscles intact VII: Masked facies, but symmetric smile VIII: hearing normal bilaterally  Motor: Tone and bulk: Mild cogwheeling tremor and increased tone R hand tremor at rest.  Decrementing finger tapping    Medications: I have reviewed the patient's current  medications, he has been placed on a reduced dose of Ambien and continues on his home oxycodone.  Pertinent Labs/Diagnostics:  CB Mild hyponatremia (133) Low TSH (0.164), free T4 1.85 (elevated) B1 pending B12 low at 69  No results found. 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 The patient's confusion, inattention (unable to repeat days of the week backwards), and waxing / waning mental status is most suggestive of delirium, polypharmacy (home med list includes: oxycodone, flexeril, ambien, artane and sinemet though these are chronic meds with no recent med changes), mild recent brain injury (edema secondary to DBS lead placement), hospitalization, and likely disrupted sleep cycle (sleeping during the day).  Notably, his B12 level has resulted at less than 100 and exacerbation of B12 deficiency in the setting of nitrous oxide for anesthesia as well described.  Thus, this metabolic derangement may be culprit given the time course of his symptoms, though certainly his peri-lead edema is also significant.   Alternative explanations for symptoms could include subclinical seizures or stroke which are less likely though could be a possible procedural complication; MRI brain would be useful for assessing for stroke if we are able to safely do this in the setting of the DBS leads. No signs of meningitis / encephalitis  in vital signs, exam, or imaging.    Recommendations: # hyponatremia  - please continue to monitor hyponatremia to confirm stability; workup per primary team - follow-up methylmalonic acid, Vit E, B1   # hypothyroidism - TSH low at 0.164 and Free t4 elevated - levothyroxine  adjustment if needed per primary team  # B12 deficiency - B12 1000 mcg IM daily x 7 days then 1000 mcg oral daily - IF antibodies  #  cerebral edema - appreciate neurosurgery recommendations - If he worsens again, will consider routine EEG to rule out seizure  - MRI brain w/o contrast (DBS device and leads will need to be reprogrammed if done; prior member of our team spoke to [employer] rep Alex who requested we hold on MRI if able so that he can try to find a local representative as he is located in [place]. Contact info for [facility] [phone]).  # Further Delirium / Encephalopathy workup/management -stopped Seroquel tonight Additionally, please follow these delirium precautions:   - try to minimize deliriogenic medications as much as possible (J Am Geriatr Soc. 2012 Apr;60(4):616-31): benzodiazepines, anticholinergics, diphenhydramine, antihistamines, narcotics, Ambien/Lunesta/Sonata etc.  - home Ambien dose is 12.5, reduced to 5 mg here  - continues on home oxy 15 mg QID scheduled, if he worsens, would reduce this next - environmental support for delirium: Lights on during the day, patient up and out of bed as much as is feasible, OT/PT, quiet dimly lit room at night, reorient patient often, provide hearing aides and glasses if patient uses them routinely, minimize sleep disruptions as much as possible overnight.  As much as possible, reorient patient, and have them engage patient in activities, e.g. playing cards. TV should be off or on neutral background music unless patient engaged and watching. Try to keep interactions with the patient calm and quiet.     06/19/2020, 8:35 AM  Brooke Dare MD-PhD [facility] [phone]    If 7pm- 7am, please page neurology on call as listed in AMION.  Addended for charge capture

## 2020-06-19 NOTE — Progress Notes (Addendum)
[facility]  PROGRESS NOTE  ELIZER BOSTIC LOV:[id number] DOB: 30-Aug-1946 DOA: 06/16/2020 PCP: [name], NP   Brief HPI:   74 year old male with medical history of Parkinson disease who is s/p bilateral deep brain stimulator to substantia nigra a week ago at [facility] came to [facility] with persistent confusion, agitation and memory loss.  CT head was done which showed expected postop changes.  Chest x-ray and UA were unimpressive.  Neurology and neurosurgery were consulted.    Subjective   Patient seen and examined, continues to be pleasantly confused   Assessment/Plan:     1. Delirium-patient developed delirium since the brain stimulator implant on 06/06/2020.  He was progressively getting worse so he was admitted to the hospital.  Work-up currently is negative.  Neurology saw the patient and are planning for MRI brain once representative is located full code reprogrammed the brain stimulator.  He is slowly improving.  Follow vitamin B1 levels.  Seroquel dose has been reduced to 12.5 mg nightly.  Need to discontinue prior to discharge.  Ambien dose reduced to 5 mg daily. 2. Parkinson disease-continue Sinemet.  S/p deep brain stimulator implant on 06/06/2020. 3. Hypertension-blood pressure stable, continue lisinopril 4. Hypothyroidism-TSH is 0.164, TSH 1.85.  We will cut down the dose of levothyroxine from 150 mcg to 125 mcg daily.  Check TSH in 6 to 8 weeks. 5. B12 deficiency-B12 level 69.  B12 1000 mcg IM daily for 7 days then 1000 mcg oral daily.  Will follow intrinsic factor antibody.   Scheduled medications:   . aspirin  81 mg Oral Daily  . carbidopa-levodopa  1 tablet Oral QID  . chlorhexidine  15 mL Mouth/Throat BID  . cyanocobalamin  1,000 mcg Intramuscular Daily  . docusate  100 mg Oral TID  . feeding supplement (ENSURE ENLIVE)  237 mL Oral BID BM  . ferrous sulfate  300 mg Oral BID WC  . levothyroxine  150 mcg Oral QAC breakfast  . lisinopril  5  mg Oral QPM  . multivitamin  15 mL Oral Daily  . oxyCODONE  15 mg Oral QID  . QUEtiapine  12.5 mg Oral QHS  . trihexyphenidyl  2 mg Oral TID WC      CBG: No results for input(s): GLUCAP in the last 168 hours.  SpO2: 96 %    CBC: Recent Labs  Lab 06/16/20 1535  WBC 5.0  NEUTROABS 2.4  HGB 12.4*  HCT 37.9*  MCV 91.3  PLT 283    Basic Metabolic Panel: Recent Labs  Lab 06/16/20 1535 06/17/20 0247  NA 133* 133*  K 4.1 3.9  CL 96* 97*  CO2 24 26  GLUCOSE 122* 129*  BUN 18 13  CREATININE 0.67 0.73  CALCIUM 9.5 9.1     Liver Function Tests: Recent Labs  Lab 06/16/20 1535  AST 19  ALT 5  ALKPHOS 110  BILITOT 0.7  PROT 7.4  ALBUMIN 3.8     Antibiotics: Anti-infectives (From admission, onward)   None       DVT prophylaxis: SCDs  Code Status: Full code  Family Communication: No family at bedside    Status is: Inpatient  Dispo: The patient is from: Home              Anticipated d/c is to: Home versus skilled nursing facility              Anticipated d/c date is: 06/21/2020  Patient currently not medically stable for discharge  Barrier to discharge-ongoing work-up for delirium     Consultants:  Neurosurgery  Procedures:     Objective   Vitals:   06/19/20 0414 06/19/20 0815 06/19/20 1245 06/19/20 1302  BP: 115/61 102/72 94/65 100/72  Pulse: (!) 59 91 84 85  Resp: 18 18 20 20   Temp: 97.7 F (36.5 C) 99 F (37.2 C) 97.8 F (36.6 C)   TempSrc: Oral Oral Oral   SpO2: 100% 100% 96%   Weight:      Height:       No intake or output data in the 24 hours ending 06/19/20 1401  08/07 1901 - 08/09 0700 In: 360 [P.O.:360] Out: 230 [Urine:230]  Filed Weights   06/17/20 0600  Weight: 77.1 kg    Physical Examination:    General: Appears in no acute distress  Cardiovascular: S1s2 RRR  Respiratory: Clear to auscultation bilaterally  Abdomen: Abdomen soft, nontender no organomegaly  Extremities: No edema in the  lower extremities  Neurologic: Alert, following commands, intermittently confused    Data Reviewed:   Recent Results (from the past 240 hour(s))  SARS Coronavirus 2 by RT PCR (hospital order, performed in [facility] hospital lab) Nasopharyngeal Nasopharyngeal Swab     Status: None   Collection Time: 06/16/20  7:17 PM   Specimen: Nasopharyngeal Swab  Result Value Ref Range Status   SARS Coronavirus 2 NEGATIVE NEGATIVE Final    Comment: (NOTE) SARS-CoV-2 target nucleic acids are NOT DETECTED.  The SARS-CoV-2 RNA is generally detectable in upper and lower respiratory specimens during the acute phase of infection. The lowest concentration of SARS-CoV-2 viral copies this assay can detect is 250 copies / mL. A negative result does not preclude SARS-CoV-2 infection and should not be used as the sole basis for treatment or other patient management decisions.  A negative result may occur with improper specimen collection / handling, submission of specimen other than nasopharyngeal swab, presence of viral mutation(s) within the areas targeted by this assay, and inadequate number of viral copies (<250 copies / mL). A negative result must be combined with clinical observations, patient history, and epidemiological information.  Fact Sheet for Patients:   08/16/20  Fact Sheet for Healthcare Providers: BoilerBrush.com.cy  This test is not yet approved or  cleared by the https://pope.com/ FDA and has been authorized for detection and/or diagnosis of SARS-CoV-2 by FDA under an Emergency Use Authorization (EUA).  This EUA will remain in effect (meaning this test can be used) for the duration of the COVID-19 declaration under Section 564(b)(1) of the Act, 21 U.S.C. section 360bbb-3(b)(1), unless the authorization is terminated or revoked sooner.  Performed at [facility], [address], [place], [place]     No results  for input(s): LIPASE, AMYLASE in the last 168 hours. Recent Labs  Lab 06/18/20 1543  AMMONIA 18     Rogen Porte S Claron Rosencrans   [facility] If 7PM-7AM, please contact night-coverage at [email or web address], Office  [phone]   06/19/2020, 2:01 PM  LOS: 2 days

## 2020-06-19 NOTE — Progress Notes (Signed)
Inpatient Rehabilitation Admissions Coordinator  I met at bedside with patient and his spouse. We discussed goals and expectations of a possible inpt rehab admit. Spouse is in agreement.I await medical workup completion.  Danne Baxter, RN, MSN Rehab Admissions Coordinator [phone] 06/19/2020 2:05 PM

## 2020-06-19 NOTE — Progress Notes (Signed)
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 Since then, pt with increased confusion and agitation. CT revealed edema in bilateral frontal lobes around the electrodes from DBS.     PT Comments    Pt progressing towards physical therapy goals. Pt was not quite as talkative this morning, however still quickly answering questions about his wife. He was oriented x1. Overall ambulation appears improved and we were able to progress to completing ADL tasks at the sink. Motor planning still slow but he was able to sequence through brushing his teeth, washing face, and using the bathroom with multimodal cues and assist for opening toothpaste and managing briefs. Will continue to follow and progress as able per POC.     Follow Up Recommendations  CIR;Supervision/Assistance - 24 hour     Equipment Recommendations  None recommended by PT    Recommendations for Other Services       Precautions / Restrictions Precautions Precautions: Fall Precaution Comments: wife denies fall history  Restrictions Weight Bearing Restrictions: No    Mobility  Bed Mobility Overal bed mobility: Needs Assistance Bed Mobility: Sit to Supine       Sit to supine: Supervision   General bed mobility comments: Pt sitting up in chair with wife present at the time of PT arrival. Pt returned to supine with supervision and assist from therapist to manage linen.   Transfers Overall transfer level: Needs assistance Equipment used: None Transfers: Sit to/from Stand Sit to Stand: Min guard         General transfer comment: Pt was able to power up to full stand without assistance. Close hands on guard provided for safety but no unsteadiness noted.    Ambulation/Gait Ambulation/Gait assistance: Min assist Gait Distance (Feet): 200 Feet Assistive device: 1 person hand held assist Gait Pattern/deviations: Shuffle;Festinating;Trunk flexed;Narrow base of support Gait velocity: Decreased Gait velocity interpretation: <1.31 ft/sec, indicative of household ambulator General Gait Details: Pt maintaining long step/stride length with minimal cues, however does get somewhat stuck when negotiating in tighter areas (bathroom, turns at EOB). Assist for direction and to lead pt away frrom an instance where he may be "stuck".    Stairs             Wheelchair Mobility    Modified Rankin (Stroke Patients Only)       Balance Overall balance assessment: Needs assistance Sitting-balance support: Feet supported;No upper extremity supported Sitting balance-Leahy Scale: Fair     Standing balance support: No upper extremity supported;During functional activity Standing balance-Leahy Scale: Fair Standing balance comment: As pt fatigued, poor                            Cognition Arousal/Alertness: Awake/alert Behavior During Therapy: Flat affect Overall Cognitive Status: Impaired/Different from baseline Area of Impairment: Orientation;Attention;Memory;Following commands;Problem solving                 Orientation Level: Disoriented to;Situation;Place (Did not ask about time) Current Attention Level: Focused Memory: Decreased short-term memory Following Commands: Follows one step commands inconsistently;Follows one step commands with increased time     Problem Solving: Slow processing;Decreased initiation;Difficulty sequencing;Requires verbal cues;Requires tactile cues        Exercises      General  Comments General comments (skin integrity, edema, etc.): wife, Geraldine Solar present.       Pertinent Vitals/Pain Pain Assessment: Faces Faces Pain Scale: No hurt    Home Living                      Prior  Function            PT Goals (current goals can now be found in the care plan section) Acute Rehab PT Goals Patient Stated Goal: for him to be able to take care of himself  PT Goal Formulation: Patient unable to participate in goal setting Time For Goal Achievement: 07/01/20 Potential to Achieve Goals: Good Progress towards PT goals: Progressing toward goals    Frequency    Min 3X/week      PT Plan Current plan remains appropriate    Co-evaluation              AM-PAC PT "6 Clicks" Mobility   Outcome Measure  Help needed turning from your back to your side while in a flat bed without using bedrails?: None Help needed moving from lying on your back to sitting on the side of a flat bed without using bedrails?: A Little Help needed moving to and from a bed to a chair (including a wheelchair)?: A Little Help needed standing up from a chair using your arms (e.g., wheelchair or bedside chair)?: A Little Help needed to walk in hospital room?: A Little Help needed climbing 3-5 steps with a railing? : A Little 6 Click Score: 19    End of Session Equipment Utilized During Treatment: Gait belt Activity Tolerance: Patient tolerated treatment well Patient left: in bed;with call bell/phone within reach;with family/visitor present Nurse Communication: Mobility status PT Visit Diagnosis: Difficulty in walking, not elsewhere classified (R26.2);Other abnormalities of gait and mobility (R26.89)     Time: 7353-2992 PT Time Calculation (min) (ACUTE ONLY): 37 min  Charges:  $Gait Training: 8-22 mins $Self Care/Home Management: 8-22                     Conni Slipper, PT, DPT Acute Rehabilitation Services Pager: [phone] Office: [phone]    Reginald Jenkins 06/19/2020, 9:53 AM

## 2020-06-19 NOTE — Consult Note (Signed)
Physical Medicine and Rehabilitation Consult Reason for Consult: Decreased functional mobility with delirium Referring Physician: [facility]   HPI: Reginald Jenkins is a 74 y.o. right-handed male with history of Parkinson's disease maintained on Sinemet status post brain stimulator placement at [facility] on 06/06/2020, hypertension, hypothyroidism.  Per chart review patient lives with spouse.  Reportedly recently independent with steady decline and functional mobility as well as cognitive deficits.  1 level home ramped entrance.  Cranial CT scan showed bilateral stimulator device is seen extending from the previously described bifrontal craniotomies with tips in the expected position of the substantia nigra bilaterally.  Mild low density seen involving the subcortical white matter around the stimulator leads in both frontal lobes most consistent with postoperative edema.  No hemorrhage or definite acute infarct noted.  Admission chemistry sodium 133, glucose 122, hemoglobin 12.4, urinalysis negative nitrite, sedimentation rate 39, ammonia level 18, vitamin B12 69.  Neurology as well as neurosurgery consulted with MRI of the brain pending.  Awaiting plan for possible EEG.  Tolerating a regular diet.  Therapy evaluations completed with recommendations of physical medicine rehab consult.   Review of Systems  Constitutional: Negative for chills and fever.  HENT: Negative for hearing loss.   Eyes: Negative for blurred vision and double vision.  Respiratory: Negative for cough.        He does have some dyspnea when climbing stairs.  Cardiovascular: Negative for chest pain, palpitations and leg swelling.  Gastrointestinal: Positive for constipation. Negative for heartburn and nausea.       GERD  Genitourinary: Negative for dysuria, flank pain and hematuria.  Musculoskeletal: Positive for myalgias.  Skin: Negative for rash.  Psychiatric/Behavioral: Positive for memory loss. The patient has  insomnia.        Agitation and restlessness as well as anxiety  All other systems reviewed and are negative.  Past Medical History:  Diagnosis Date  . Anemia    borderline  . 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 Arthritis   . Broken collarbone    as a child  . Dyspnea    when climbing stairs   . GERD (gastroesophageal reflux disease)   . Hypertension   . Hypothyroidism   . Parkinson disease (HCC) 2010   Past Surgical History:  Procedure Laterality Date  . APPENDECTOMY    . arm surgery Left 1967   a rod was put in then 8 months it was removed  . CHOLECYSTECTOMY    . LAMINECTOMY  1986  . ROTATOR CUFF REPAIR Right 2001  . 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   Procedure: LEFT TOTAL SHOULDER ARTHROPLASTY;  Surgeon: Francena Hanly, MD;  Location: [facility] OR;  Service: Orthopedics;  Laterality: Left;   No family history on file. Social History:  reports that he quit smoking about 53 years ago. His smoking use included cigarettes. He has a 1.50 pack-year smoking history. He quit smokeless tobacco use about 31 years ago.  His smokeless tobacco use included chew. He reports that he does not drink alcohol and does not use drugs. Allergies: No Known Allergies Medications Prior to Admission  Medication Sig Dispense Refill  . acetaminophen (TYLENOL) 500 MG tablet Take 500 mg by mouth every 6 (six) hours as needed (for pain.).    Marland Kitchen aspirin EC 81 MG tablet Take 81 mg by mouth at bedtime.    . carbidopa-levodopa (SINEMET IR) 25-100 MG tablet Take 1 tablet by mouth 4 (four) times daily.    Marland Kitchen  cyclobenzaprine (FLEXERIL) 10 MG tablet Take 10 mg by mouth 3 (three) times daily as needed for muscle spasms.    Marland Kitchen. docusate sodium (COLACE) 100 MG capsule Take 100 mg by mouth 3 (three) times daily.    . ferrous sulfate 325 (65 FE) MG tablet Take 325 mg by mouth 2 (two) times daily.    Marland Kitchen. levothyroxine (SYNTHROID, LEVOTHROID) 150 MCG tablet Take 150 mcg by mouth daily before breakfast.    . lisinopril  (PRINIVIL,ZESTRIL) 5 MG tablet Take 5 mg by mouth every evening.    . Multiple Vitamin (MULTIVITAMIN WITH MINERALS) TABS tablet Take 1 tablet by mouth daily. Centrum    . olopatadine (PATANOL) 0.1 % ophthalmic solution Place 1 drop into both eyes 2 (two) times daily as needed for allergies.    Marland Kitchen. oxyCODONE (ROXICODONE) 15 MG immediate release tablet Take 15 mg by mouth 4 (four) times daily.    . trihexyphenidyl (ARTANE) 2 MG tablet Take 2 mg by mouth 3 (three) times daily with meals.     Marland Kitchen. zolpidem (AMBIEN CR) 12.5 MG CR tablet Take 12.5 mg by mouth at bedtime.      Home: Home Living Family/patient expects to be discharged to:: Private residence Living Arrangements: Spouse/significant other Available Help at Discharge: Family, Available 24 hours/day Type of Home: House Home Access: Ramped entrance (in back) Home Layout: One level Bathroom Shower/Tub: Health visitorWalk-in shower Bathroom Toilet: Handicapped height Home Equipment: Shower seat - built in Engineering geologist(Simultaneous filing. User may not have seen previous data.) Additional Comments[place] and she is having someone install grab bars in the bathroom   Functional History: Prior Function Level of Independence: Independent Gait / Transfers Assistance Needed: Not using an AD PTA Functional Status:  Mobility: Bed Mobility Overal bed mobility: Needs Assistance Bed Mobility: Supine to Sit Supine to sit: Min assist General bed mobility comments: Assist to initiate movement but pt able to transition to EOB largely without assist. To quicken the process assist provided for pt to pull himself fully out to EOB to get feet on the floor.  Transfers Overall transfer level: Needs assistance Equipment used: None Transfers: Sit to/from Stand, Stand Pivot Transfers Sit to Stand: Min guard Stand pivot transfers: Min assist General transfer comment: Pt was able to power up to full stand without assistance. Close hands  on guard provided for safety but no unsteadiness noted.  Ambulation/Gait Ambulation/Gait assistance: Min assist Gait Distance (Feet): 200 Feet Assistive device: 1 person hand held assist Gait Pattern/deviations: Shuffle, Festinating, Trunk flexed, Narrow base of support General Gait Details: VC's for long/big steps more frequent towards end of gait training due to fatigue. Pt initally ambulating fairly well with HHA - do not feel he needed it for balance however pt reaching out and trying to put hand around therapist's waist, so opted for HHA instead.  Gait velocity: Decreased Gait velocity interpretation: <1.31 ft/sec, indicative of household ambulator    ADL: ADL Overall ADL's : Needs assistance/impaired Eating/Feeding: Total assistance, Sitting Grooming: Wash/dry hands, Moderate assistance, Standing Grooming Details (indicate cue type and reason): able to initiate reaching for soap, unable to determine if he wanted 3x of soap or unable to transition to next step of hand washing. physical assistance required to move L hand and bring to R hand to begin hand washing.  able to lather and move hands together in washing motion.  assistance for drying.  offered face washing and oral care pt. replied "  no" Upper Body Bathing: Total assistance, Sitting Lower Body Bathing: Total assistance, Sit to/from stand Upper Body Dressing : Total assistance, Sitting Lower Body Dressing: Total assistance, Sit to/from stand Toilet Transfer: Min guard, Minimal assistance, Cueing for sequencing, Stand-pivot, Regular Toilet, BSC, Grab bars Toilet Transfer Details (indicate cue type and reason): intermittent furniture walking utilized from sink and grab bars while entering the b.room.  max cues to take backwards steps to the toilet before sitting down. Toileting- Clothing Manipulation and Hygiene: Moderate assistance, Sit to/from stand Toileting - Clothing Manipulation Details (indicate cue type and reason): reports  his wife usually assists with undergarments but used R hand to assist with pulling up right side of brief after urinating Functional mobility during ADLs: Minimal assistance, +2 for safety/equipment, +2 for physical assistance General ADL Comments: assisting with toileting and grooming tasks. noted improvement from yesterdays session  Cognition: Cognition Overall Cognitive Status: Impaired/Different from baseline Orientation Level: Oriented to person Cognition Arousal/Alertness: Awake/alert Behavior During Therapy: Flat affect Overall Cognitive Status: Impaired/Different from baseline Area of Impairment: Orientation, Attention, Memory, Following commands, Problem solving Orientation Level: Disoriented to, Situation Current Attention Level: Focused Memory: Decreased short-term memory Following Commands: Follows one step commands inconsistently, Follows one step commands with increased time Problem Solving: Slow processing, Decreased initiation, Difficulty sequencing, Requires verbal cues, Requires tactile cues General Comments: keeping eyes open for longer periods of time today.  answering most questions today but with significant delay and often having the question repeated.  initiation noted with simple grooming task but with perserveration and min/mod physical and verbal guidance to progress the task or action.  Blood pressure 115/61, pulse (!) 59, temperature 97.7 F (36.5 C), temperature source Oral, resp. rate 18, height 5' 7.5" (1.715 m), weight 77.1 kg, SpO2 100 %. Physical Exam HENT:     Head:     Comments: Crani incisions    Nose: Nose normal.  Eyes:     Pupils: Pupils are equal, round, and reactive to light.  Cardiovascular:     Rate and Rhythm: Normal rate.  Pulmonary:     Effort: Pulmonary effort is normal.  Musculoskeletal:     Cervical back: Normal range of motion.  Neurological:     Mental Status: He is alert.     Comments: Patient is alert.  Made eye contact with  examiner.  Follows simple commands.    able to provide his name and that he had been married for 52 years.  He cannot provide the appropriate date for today. Said he was in [place], [place]. Recognized wife. Very delayed with auditory processing. Resting tremor. Rigidity in all 4 limbs. Speech dysarthric. Moves all 4's. Senses pain.   Psychiatric:     Comments: Flat, cooperates     Results for orders placed or performed during the hospital encounter of 06/16/20 (from the past 24 hour(s))  Sedimentation rate     Status: Abnormal   Collection Time: 06/18/20 11:21 AM  Result Value Ref Range   Sed Rate 39 (H) 0 - 16 mm/hr  C-reactive protein     Status: Abnormal   Collection Time: 06/18/20 11:21 AM  Result Value Ref Range   CRP 1.7 (H) <1.0 mg/dL  T4, free     Status: Abnormal   Collection Time: 06/18/20 11:21 AM  Result Value Ref Range   Free T4 1.85 (H) 0.61 - 1.12 ng/dL  Vitamin N86     Status: Abnormal   Collection Time: 06/18/20 11:21 AM  Result Value Ref Range  Vitamin B-12 69 (L) 180 - 914 pg/mL  Ammonia     Status: None   Collection Time: 06/18/20  3:43 PM  Result Value Ref Range   Ammonia 18 9 - 35 umol/L   No results found.   Assessment/Plan: Diagnosis: functional deficits related to Parkinson's disease, recent DBS placement and post-op complications. MRI, further work up still pending.  1. Does the need for close, 24 hr/day medical supervision in concert with the patient's rehab needs make it unreasonable for this patient to be served in a less intensive setting? Yes 2. Co-Morbidities requiring supervision/potential complications: HTN, hypothyroid, anxiety 3. Due to bladder management, bowel management, safety, skin/wound care, disease management, medication administration, pain management and patient education, does the patient require 24 hr/day rehab nursing? Yes 4. Does the patient require coordinated care of a physician, rehab nurse, therapy disciplines of PT, OT, SLP to  address physical and functional deficits in the context of the above medical diagnosis(es)? Yes Addressing deficits in the following areas: balance, endurance, locomotion, strength, transferring, bowel/bladder control, bathing, dressing, feeding, grooming, toileting, cognition, speech, swallowing and psychosocial support 5. Can the patient actively participate in an intensive therapy program of at least 3 hrs of therapy per day at least 5 days per week? Yes 6. The potential for patient to make measurable gains while on inpatient rehab is good 7. Anticipated functional outcomes upon discharge from inpatient rehab are supervision and min assist  with PT, supervision and min assist with OT, supervision and min assist/mod assistwith SLP. 8. Estimated rehab length of stay to reach the above functional goals is: 11-18 days 9. Anticipated discharge destination: Home 10. Overall Rehab/Functional Prognosis: good  RECOMMENDATIONS: This patient's condition is appropriate for continued rehabilitative care in the following setting: CIR Patient has agreed to participate in recommended program. N/A Note that insurance prior authorization may be required for reimbursement for recommended care.  Comment: Spoke to wife at length. Work up ongoing. Would benefit from the interdisciplinary approach of inpatient rehab as it pertains to his ongoing neurological deficits and functional needs. Rehab Admissions Coordinator to follow up.  Thanks,  Ranelle Oyster, MD, [place] [facility]  I have personally performed a face to face diagnostic evaluation of this patient. Additionally, I have examined pertinent labs and radiographic images. I have reviewed and concur with the physician assistant's documentation above.    Mcarthur Rossetti Angiulli, PA-C 06/19/2020

## 2020-06-19 NOTE — Progress Notes (Signed)
Initial Nutrition Assessment  DOCUMENTATION CODES:   Not applicable  INTERVENTION:  Magic cup TID with meals, each supplement provides 290 kcal and 9 grams of protein  Continue Ensure Enlive po BID, each supplement provides 350 kcal and 20 grams of protein  Continue MVI daily  NUTRITION DIAGNOSIS:   Inadequate oral intake related to lethargy/confusion as evidenced by per patient/family report.    GOAL:   Patient will meet greater than or equal to 90% of their needs    MONITOR:   PO intake, Supplement acceptance, Skin, Labs, Weight trends, I & O's  REASON FOR ASSESSMENT:   Malnutrition Screening Tool    ASSESSMENT:   Pt with a PMH including Parkinson's disease s/p bilateral STN DBS presents with 1 week of confusion and agitation post surgery.  Pt recently had brain stimulator placed at [facility] on 7/27. After placement, pt began to develop confusion, hallucinations, lack of sleep, pacing, and irritability. This has been progressively worsening per pt's wife.  Pt unavailable at time of RD visit.    Limited wt hx available for review.   PO Intake: 50-75% x 2 recorded meals  Labs: Na 133 (L) Medications: vitamin B12, colace, ensure Enlive BID, ferrous sulfate, MVI  NUTRITION - FOCUSED PHYSICAL EXAM:  Unable to perform at this time, will attempt at follow-up.  Diet Order:   Diet Order            Diet Heart Room service appropriate? Yes; Fluid consistency: Thin  Diet effective now                 EDUCATION NEEDS:   No education needs have been identified at this time  Skin:  Skin Assessment: Skin Integrity Issues: Skin Integrity Issues:: Incisions Incisions: head, L shoulder  Last BM:  PTA  Height:   Ht Readings from Last 1 Encounters:  06/17/20 5' 7.5" (1.715 m)    Weight:   Wt Readings from Last 3 Encounters:  06/17/20 77.1 kg  01/23/18 83 kg  01/14/18 83.1 kg    BMI:  Body mass index is 26.23 kg/m.  Estimated Nutritional Needs:    Kcal:  1950-2150  Protein:  100-110 grams  Fluid:  >/= 1.95L/d    Eugene Gavia, MS, RD, LDN RD pager number and weekend/on-call pager number located in Amion.

## 2020-06-20 LAB — VITAMIN B1: Vitamin B1 (Thiamine): 106.2 nmol/L (ref 66.5–200.0)

## 2020-06-20 LAB — BASIC METABOLIC PANEL
Anion gap: 11 (ref 5–15)
BUN: 17 mg/dL (ref 8–23)
CO2: 26 mmol/L (ref 22–32)
Calcium: 9.3 mg/dL (ref 8.9–10.3)
Chloride: 98 mmol/L (ref 98–111)
Creatinine, Ser: 0.67 mg/dL (ref 0.61–1.24)
GFR calc Af Amer: >60 mL/min (ref >60)
GFR calc non Af Amer: >60 mL/min (ref >60)
Glucose, Bld: 143 mg/dL — ABNORMAL HIGH (ref 70–99)
Potassium: 4.2 mmol/L (ref 3.5–5.1)
Sodium: 135 mmol/L (ref 135–145)

## 2020-06-20 LAB — VITAMIN E
Vitamin E (Alpha Tocopherol): 7.6 mg/L — ABNORMAL LOW (ref 9.0–29.0)
Vitamin E(Gamma Tocopherol): 0.3 mg/L — ABNORMAL LOW (ref 0.5–4.9)

## 2020-06-20 MED ORDER — MELATONIN 5 MG PO TABS
5.0000 mg | ORAL_TABLET | Freq: Once | ORAL | Status: AC | PRN
  Administered 2020-06-20: 5 mg via ORAL
  Filled 2020-06-20: qty 1

## 2020-06-20 MED ORDER — BISACODYL 10 MG RE SUPP
10.0000 mg | Freq: Once | RECTAL | Status: AC
  Administered 2020-06-20: 10 mg via RECTAL
  Filled 2020-06-20: qty 1

## 2020-06-20 MED ORDER — QUETIAPINE FUMARATE 25 MG PO TABS
12.5000 mg | ORAL_TABLET | Freq: Every evening | ORAL | Status: DC | PRN
  Administered 2020-06-20: 12.5 mg via ORAL
  Filled 2020-06-20 (×2): qty 1

## 2020-06-20 MED ORDER — QUETIAPINE FUMARATE 25 MG PO TABS
12.5000 mg | ORAL_TABLET | Freq: Once | ORAL | Status: AC
  Administered 2020-06-20: 12.5 mg via ORAL

## 2020-06-20 MED ORDER — ZOLPIDEM TARTRATE 5 MG PO TABS
7.5000 mg | ORAL_TABLET | Freq: Every evening | ORAL | Status: DC | PRN
  Administered 2020-06-20: 7.5 mg via ORAL
  Filled 2020-06-20: qty 2

## 2020-06-20 NOTE — Progress Notes (Signed)
Pt irritable during shift. According to pt spouse at bedside, pt has not really slept on this shift. Pt spouse requesting for pt get something to help him sleep but pt already received all his ordered prn meds and not yet time, MD on call notified and new order received. Will continue to closely monitor pt. Arabella Merles Jentry Warnell RN.

## 2020-06-20 NOTE — Progress Notes (Addendum)
NEUROLOGY PROGRESS NOTE   Brief history: Reginald Jenkins is a 74 y.o. 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  CT head was performed which showed bilateral DBS electrodes placed in the substantia nigra with surrounding edema in bilateral frontal lobes around the electrodes.  Subjective/Interval history: [name] mental status is stable from yesterday. Wife feels like his ability to move is improved in the setting of not getting Seroquel last night, but she tells me that he did not sleep at all overnight and was agitated and angry at her which is very atypical for him.  She also wanted me to know that his cognitive evaluation prior to DBS did not show any evidence of dementia.  The patient tries to express some thoughts but his grammar remains difficult for [place].  Exam: Vitals:   06/20/20 0334 06/20/20 0856  BP: 138/71 126/68  Pulse: 67 81  Resp: 17 18  Temp: 98.1 F (36.7 C) 98.4 F (36.9 C)  SpO2: 99% 100%    Physical Exam  Constitutional: Appears well-developed and well-nourished.  Psych: Improving ability to follow conversation, affect appropriate though does have masked facies, smiles at times Eyes: No scleral injection HENT: No OP obstrucion Head: Normocephalic. Staples in place on frontal scalp, healing well Cardiovascular: Perfusing extremities well Respiratory: Effort normal, non-labored breathing Abdomen: non-distended Extremities: well perfused, no significant edema Skin: DBS pulse generator in place below R clavicle  Neuro:  Mental Status: Able to follow more complex commands, able to name and repeat oriented to self, [facility], but not year or month Cranial Nerves: II: Grossly intact III,IV, VI: External ocular muscles intact VII: Masked facies, but symmetric  smile VIII: hearing normal bilaterally  Motor: Tone and bulk: Mild cogwheeling tremor and increased tone R hand tremor at rest.  Decrementing finger tapping    Medications: I have reviewed the patient's current medications, he has been placed on a reduced dose of Ambien and continues on his home oxycodone.  Pertinent Labs/Diagnostics:  Mild hyponatremia (133) Low TSH (0.164), free T4 1.85 (elevated) B1 pending B12 low at 69 Ammonia resulted normal at 18  Follow-up head CT on 8/9 was personally reviewed by myself and demonstrated resolving peri-DBS-lead edema as well as some additional hypodensities of unclear chronicity   CT HEAD WO CONTRAST  Result Date: 06/19/2020 CLINICAL DATA:  Follow-up neural stimulator leads and associated bifrontal edema. EXAM: CT HEAD WITHOUT CONTRAST TECHNIQUE: Contiguous axial images were obtained from the base of the skull through the vertex without intravenous contrast. COMPARISON:  06/16/2020 FINDINGS: Brain: Stable bifrontal neural stimulator leads extending into the upper midbrain bilaterally. Low density in the midbrain and pons bilaterally is unchanged. White matter low density surrounding the leads in both cerebral hemispheres is slightly less prominent. No associated mass effect. No intracranial hemorrhage. Ill-defined low density in the right occipital lobe without significant change. Vascular: No hyperdense vessel or unexpected calcification. Skull: Stable bifrontal burr holes. Sinuses/Orbits: Unremarkable. Other: None. IMPRESSION: 1. Stable bifrontal neural stimulator leads extending into the upper midbrain bilaterally. 2. White matter low density surrounding the leads in both cerebral hemispheres is slightly less prominent. 3. Stable ill-defined low density in the midbrain and pons bilaterally, possibly representing ischemic changes. 4. Stable ill-defined low density in the right occipital lobe, possibly representing an area of subacute infarction.  Electronically  Signed   By: Beckie Salts M.D.   On: 06/19/2020 19:01   Impression: 74 year old male with past medical history of Parkinson's disease without dementia, status post bilateral deep brain stimulator to sepsis nigra presents with 1 week of confusion and agitation post surgery.   Work-up has revealed some metabolic issues which we have been addressing (reducing levothyroxine dose, starting B12 supplementation), the patient's mental status has been gradually improving and yesterday's head CT shows that the perilead edema has been improving.  We will follow up MRI to more definitively assess whether additional hypodensities on head CT are chronic or more subacute in nature.  Today I had a long conversation with his wife about medications.  Given his long-term use of Ambien at a very high dose for sleep, he is likely to require very gradual tapering of this medication which is quite deliriogenic.  Given the black box warning for Seroquel in patients with dementia (which is Parkinson's disease does put him at risk for even though his cognitive testing has been reassuring per wife's report), I do want to minimize this medication.  We will therefore increase Ambien to 7.5 mg tonight and start Seroquel on an only as-needed basis.  Wife is in agreement with this plan after discussion of risks and benefits.  Recommendations: # hyponatremia  - please continue to monitor hyponatremia to confirm stability; workup per primary team; rechecking BMP today - follow-up methylmalonic acid, Vit E, B1   # hypothyroidism - TSH low at 0.164 and Free t4 elevated - levothyroxine adjustment (reduced from 150 to 125 per primary team)  # B12 deficiency - B12 1000 mcg IM daily x 7 days then 1000 mcg oral daily - IF antibodies 1.1 -- normal  - appreciate medicine recommendations for any further workup / monitoring   # cerebral edema, resolving - appreciate neurosurgery recommendations - If he worsens again,  will consider routine EEG to rule out seizure  - MRI brain w/o contrast (DBS device and leads will need to be reprogrammed if done; we have arranged time with MRI and [employer] rep Trinna Post [phone]) to have MRI completed Wednesday morning at 9 AM.  # Further Delirium / Encephalopathy workup/management -made Seroquel PRN 12.5 mg tonight Additionally, please follow these delirium precautions:   - try to minimize deliriogenic medications as much as possible (J Am Geriatr Soc. 2012 Apr;60(4):616-31): benzodiazepines, anticholinergics, diphenhydramine, antihistamines, narcotics, Ambien/Lunesta/Sonata etc.  - home Ambien dose is 12.5, reduced to 5 mg initially, increased to 7.5 mg 8/10 with hopes of weaning Seroquel   - continues on home oxy 15 mg QID scheduled, if he worsens, would reduce this next - Continue environmental support for delirium: Lights on during the day, patient up and out of bed as much as is feasible, OT/PT, quiet dimly lit room at night, reorient patient often, provide hearing aides and glasses if patient uses them routinely, minimize sleep disruptions as much as possible overnight.  As much as possible, reorient patient, and have them engage patient in activities, e.g. playing cards. TV should be off or on neutral background music unless patient engaged and watching. Try to keep interactions with the patient calm and quiet.     06/20/2020, 9:14 AM  Brooke Dare MD-PhD [facility] [phone]    If 7pm- 7am, please page neurology on call as listed in AMION.  Addended for charge capture

## 2020-06-20 NOTE — H&P (Signed)
Physical Medicine and Rehabilitation Admission H&P    Chief Complaint  Patient presents with  . Weakness  : HPI: Reginald Jenkins is a 74 year old right-handed male history of Parkinson's disease maintained on Sinemet as well as Artane followed by neurology Dr. Myer Peer in [facility] status post brain stimulator placement [facility] on 06/06/2020, hypertension and hypothyroidism as well as chronic back pain maintained on oxycodone 15 mg 4 times daily followed by Dr. Erskin Burnet in [place].  History taken from chart review and wife due to cognition.  Patient lives with spouse.  Reportedly recently independent with steady decline in functional mobility as well as cognitive deficits.  1 level home with ramped entrance.  Cranial CT scan showed bilateral stimulator device seen extending from the previously described bifrontal craniotomies with tips in the expected position of the substantia nigra bilaterally.  Mild low density seen involving the subcortical white matter around the stimulator leads in both frontal lobes most consistent with postoperative edema.  No hemorrhage or definite acute infarct noted.  Admission chemistry sodium 133, glucose 122 hemoglobin 12.4, TSH 0.164 and free T4 1.85, urinalysis negative nitrite, sedimentation rate 39, ammonia level 18, vitamin B12 69.  Neurology service as well as neurosurgery consulted with follow-up cranial CT scan 06/19/2020 stable bifrontal neurostimulator leads white matter low density surrounding leads in both cerebral hemispheres slightly less prominent than prior tracing.  Stable ill-defined low-density in the midbrain and pons bilaterally possibly representing ischemic changes.  Stable ill-defined low-density right occipital lobe possibly representing area of a subacute infarction and recommendations remained to continue to monitor and did remain on low-dose aspirin.  Follow-up MRI on 06/21/2020 showing edema around DBS  leads bilaterally, no suspected infection.  Small bilateral occipital subdural hematomas. [name] delirium agitation has improved and his Seroquel was discontinued.  He was on low-dose Ambien as prior to admission reduced to 5 mg.  No current plan for EEG.  Patient was placed on B12 1000 mcg intramuscularly daily x7 days.  Therapy evaluations completed and patient was admitted for a comprehensive rehab program.  Please see preadmission assessment from earlier today as well.  Review of Systems  Unable to perform ROS: Mental acuity   Past Medical History:  Diagnosis Date  . Anemia    borderline  . 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 Arthritis   . Broken collarbone    as a child  . Dyspnea    when climbing stairs   . GERD (gastroesophageal reflux disease)   . Hypertension   . Hypothyroidism   . Parkinson disease (HCC) 2010   Past Surgical History:  Procedure Laterality Date  . APPENDECTOMY    . arm surgery Left 1967   a rod was put in then 8 months it was removed  . CHOLECYSTECTOMY    . LAMINECTOMY  1986  . ROTATOR CUFF REPAIR Right 2001  . 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   Procedure: LEFT TOTAL SHOULDER ARTHROPLASTY;  Surgeon: Francena Hanly, MD;  Location: [facility] OR;  Service: Orthopedics;  Laterality: Left;   No pertinent family history of DBS. Social History:  reports that he quit smoking about 53 years ago. His smoking use included cigarettes. He has a 1.50 pack-year smoking history. He quit smokeless tobacco use about 31 years ago.  His smokeless tobacco use included chew. He reports that he does not drink alcohol and does not use drugs. Allergies: No Known Allergies Medications Prior to Admission  Medication Sig Dispense Refill  . acetaminophen (TYLENOL) 500 MG tablet Take 500 mg by mouth every 6 (six) hours as needed (for pain.).    Marland Kitchen aspirin EC 81 MG tablet Take 81 mg by mouth at bedtime.    . carbidopa-levodopa (SINEMET IR) 25-100 MG tablet Take 1 tablet by  mouth 4 (four) times daily.    . cyclobenzaprine (FLEXERIL) 10 MG tablet Take 10 mg by mouth 3 (three) times daily as needed for muscle spasms.    Marland Kitchen docusate sodium (COLACE) 100 MG capsule Take 100 mg by mouth 3 (three) times daily.    . ferrous sulfate 325 (65 FE) MG tablet Take 325 mg by mouth 2 (two) times daily.    Marland Kitchen levothyroxine (SYNTHROID, LEVOTHROID) 150 MCG tablet Take 150 mcg by mouth daily before breakfast.    . lisinopril (PRINIVIL,ZESTRIL) 5 MG tablet Take 5 mg by mouth every evening.    . Multiple Vitamin (MULTIVITAMIN WITH MINERALS) TABS tablet Take 1 tablet by mouth daily. Centrum    . olopatadine (PATANOL) 0.1 % ophthalmic solution Place 1 drop into both eyes 2 (two) times daily as needed for allergies.    Marland Kitchen oxyCODONE (ROXICODONE) 15 MG immediate release tablet Take 15 mg by mouth 4 (four) times daily.    . trihexyphenidyl (ARTANE) 2 MG tablet Take 2 mg by mouth 3 (three) times daily with meals.     Marland Kitchen zolpidem (AMBIEN CR) 12.5 MG CR tablet Take 12.5 mg by mouth at bedtime.      Drug Regimen Review Drug regimen was reviewed and remains appropriate with no significant issues identified  Home: Home Living Family/patient expects to be discharged to:: Private residence Living Arrangements: Spouse/significant other Available Help at Discharge: Family, Available 24 hours/day Type of Home: House Home Access: Ramped entrance (in back) Home Layout: One level Bathroom Shower/Tub: Health visitor: Handicapped height Home Equipment: Shower seat - built in Engineering geologist. User may not have seen previous data.) Additional Comments: wife reports that the [place] is getting them a shower seat and she is having someone install grab bars in the bathroom    Functional History: Prior Function Level of Independence: Independent Gait / Transfers Assistance Needed: Not using an AD PTA  Functional Status:  Mobility: Bed Mobility Overal bed mobility: Needs Assistance Bed  Mobility: Sit to Supine Supine to sit: Min assist Sit to supine: Supervision General bed mobility comments: Pt sitting up in chair with wife present at the time of PT arrival. Pt returned to supine with supervision and assist from therapist to manage linen.  Transfers Overall transfer level: Needs assistance Equipment used: None Transfers: Sit to/from Stand Sit to Stand: Min guard Stand pivot transfers: Min assist General transfer comment: Pt was able to power up to full stand without assistance. Close hands on guard provided for safety but no unsteadiness noted.  Ambulation/Gait Ambulation/Gait assistance: Min assist Gait Distance (Feet): 200 Feet Assistive device: 1 person hand held assist Gait Pattern/deviations: Shuffle, Festinating, Trunk flexed, Narrow base of support General Gait Details: Pt maintaining long step/stride length with minimal cues, however does get somewhat stuck when negotiating in tighter areas (bathroom, turns at EOB). Assist for direction and to lead pt away frrom an instance where he may be "stuck".  Gait velocity: Decreased Gait velocity interpretation: <1.31 ft/sec, indicative of household ambulator    ADL: ADL Overall ADL's : Needs assistance/impaired Eating/Feeding: Total assistance, Sitting Grooming: Wash/dry hands, Moderate assistance, Standing Grooming Details (indicate cue type and reason):  able to initiate reaching for soap, unable to determine if he wanted 3x of soap or unable to transition to next step of hand washing. physical assistance required to move L hand and bring to R hand to begin hand washing.  able to lather and move hands together in washing motion.  assistance for drying.  offered face washing and oral care pt. replied "no" Upper Body Bathing: Total assistance, Sitting Lower Body Bathing: Total assistance, Sit to/from stand Upper Body Dressing : Total assistance, Sitting Lower Body Dressing: Total assistance, Sit to/from stand Toilet  Transfer: Min guard, Minimal assistance, Cueing for sequencing, Stand-pivot, Regular Toilet, BSC, Grab bars Toilet Transfer Details (indicate cue type and reason): intermittent furniture walking utilized from sink and grab bars while entering the b.room.  max cues to take backwards steps to the toilet before sitting down. Toileting- Clothing Manipulation and Hygiene: Moderate assistance, Sit to/from stand Toileting - Clothing Manipulation Details (indicate cue type and reason): reports his wife usually assists with undergarments but used R hand to assist with pulling up right side of brief after urinating Functional mobility during ADLs: Minimal assistance, +2 for safety/equipment, +2 for physical assistance General ADL Comments: assisting with toileting and grooming tasks. noted improvement from yesterdays session  Cognition: Cognition Overall Cognitive Status: Impaired/Different from baseline Orientation Level: Oriented to person Cognition Arousal/Alertness: Awake/alert Behavior During Therapy: Flat affect Overall Cognitive Status: Impaired/Different from baseline Area of Impairment: Orientation, Attention, Memory, Following commands, Problem solving Orientation Level: Disoriented to, Situation, Place (Did not ask about time) Current Attention Level: Focused Memory: Decreased short-term memory Following Commands: Follows one step commands inconsistently, Follows one step commands with increased time Problem Solving: Slow processing, Decreased initiation, Difficulty sequencing, Requires verbal cues, Requires tactile cues General Comments: keeping eyes open for longer periods of time today.  answering most questions today but with significant delay and often having the question repeated.  initiation noted with simple grooming task but with perserveration and min/mod physical and verbal guidance to progress the task or action.  Physical Exam: Blood pressure 135/65, pulse 71, temperature 97.9  F (36.6 C), temperature source Oral, resp. rate 18, height 5' 7.5" (1.715 m), weight 77.1 kg, SpO2 100 %. Physical Exam Vitals reviewed.  Constitutional:      General: He is not in acute distress. HENT:     Head:     Comments: Staples intact to sites, bilaterally, after recent DBS stimulator placement    Right Ear: External ear normal.     Left Ear: External ear normal.     Nose: Nose normal.  Eyes:     General:        Right eye: No discharge.        Left eye: No discharge.     Extraocular Movements: Extraocular movements intact.  Cardiovascular:     Rate and Rhythm: Normal rate.  Pulmonary:     Effort: Pulmonary effort is normal. No respiratory distress.     Breath sounds: No stridor.  Abdominal:     General: Abdomen is flat. There is no distension.  Musculoskeletal:     Cervical back: Normal range of motion and neck supple.     Comments: No edema or tenderness in extremities  Skin:    Comments: Scalp with staples bilateral CDI  Neurological:     Mental Status: He is alert.     Comments: Alert and oriented x1  Makes eye contact with examiner. Follows simple commands.   Mild dysarthria  He was able  to provide his name and his wife but cannot provide appropriate date.  Some delay in auditory processing.   He does have a resting tremor with rigidity to all 4 limbs. Motor: Grossly 5/5 throughout  Psychiatric:     Comments: Limited due to mentation.     Results for orders placed or performed during the hospital encounter of 06/16/20 (from the past 48 hour(s))  Basic metabolic panel     Status: Abnormal   Collection Time: 06/20/20 11:10 AM  Result Value Ref Range   Sodium 135 135 - 145 mmol/L   Potassium 4.2 3.5 - 5.1 mmol/L   Chloride 98 98 - 111 mmol/L   CO2 26 22 - 32 mmol/L   Glucose, Bld 143 (H) 70 - 99 mg/dL    Comment: Glucose reference range applies only to samples taken after fasting for at least 8 hours.   BUN 17 8 - 23 mg/dL   Creatinine, Ser 6.75 0.61 -  1.24 mg/dL   Calcium 9.3 8.9 - 91.6 mg/dL   GFR calc non Af Amer >60 >60 mL/min   GFR calc Af Amer >60 >60 mL/min   Anion gap 11 5 - 15    Comment: Performed at [facility], [address], [place], [place] [address]   CT HEAD WO CONTRAST  Result Date: 06/19/2020 CLINICAL DATA:  Follow-up neural stimulator leads and associated bifrontal edema. EXAM: CT HEAD WITHOUT CONTRAST TECHNIQUE: Contiguous axial images were obtained from the base of the skull through the vertex without intravenous contrast. COMPARISON:  06/16/2020 FINDINGS: Brain: Stable bifrontal neural stimulator leads extending into the upper midbrain bilaterally. Low density in the midbrain and pons bilaterally is unchanged. White matter low density surrounding the leads in both cerebral hemispheres is slightly less prominent. No associated mass effect. No intracranial hemorrhage. Ill-defined low density in the right occipital lobe without significant change. Vascular: No hyperdense vessel or unexpected calcification. Skull: Stable bifrontal burr holes. Sinuses/Orbits: Unremarkable. Other: None. IMPRESSION: 1. Stable bifrontal neural stimulator leads extending into the upper midbrain bilaterally. 2. White matter low density surrounding the leads in both cerebral hemispheres is slightly less prominent. 3. Stable ill-defined low density in the midbrain and pons bilaterally, possibly representing ischemic changes. 4. Stable ill-defined low density in the right occipital lobe, possibly representing an area of subacute infarction. Electronically Signed   By: Beckie Salts M.D.   On: 06/19/2020 19:01       Medical Problem List and Plan: 1.  Decreased functional mobility secondary to Parkinson's disease recent DBS placement 06/06/2020 [facility] with postoperative complications/delirium  -patient may not shower  -ELOS/Goals: 6-9 days/Mod I/supervision  Admit to CIR 2.  Antithrombotics: SCDs -DVT/anticoagulation:     -antiplatelet therapy: Aspirin 81 mg daily 3. Pain Management/chronic back pain: Currently on home regimen of oxycodone 15 mg 4 times daily, Flexeril 10 mg 3 times daily as needed.  Monitor mental status  Monitor with increased exertion. 4. Mood: Ambien as prior to admission decreased to 5 mg nightly  -antipsychotic agents: Seroquel 12.5 mg nightly as needed 5. Neuropsych: This patient is not capable of making decisions on his own behalf. 6. Skin/Wound Care: Routine skin checks 7. Fluids/Electrolytes/Nutrition: Routine in and outs.  CMP ordered for tomorrow a.m. 8.  Parkinson's disease.  Continue Sinemet 25-100 mg 4 times daily as well as Artane 2 mg 3 times daily 9.  Hypertension.  Lisinopril 5 mg daily.     Monitor with increased mobility. 10.  B12 deficiency.  Complete 7-day  course of vitamin B12 1000 mcg daily x7 days 11.  Hypothyroidism.  Continue Synthroid  Mcarthur RossettiDaniel J Angiulli, PA-C 06/21/2020  I have personally performed a face to face diagnostic evaluation, including, but not limited to relevant history and physical exam findings, of this patient and developed relevant assessment and plan.  Additionally, I have reviewed and concur with the physician assistant's documentation above.  Maryla MorrowAnkit Sheppard Luckenbach, MD, ABPMR

## 2020-06-20 NOTE — Progress Notes (Addendum)
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  CT head was done which showed expected postop changes.  Chest x-ray and UA were unimpressive.  Neurology and neurosurgery were consulted.    Subjective   Patient seen and examined, is much more alert, communicating.  Oriented to self, place.   Assessment/Plan:     1. Delirium-patient developed delirium since the brain stimulator implant on 06/06/2020.  He was progressively getting worse so he was admitted to the hospital.  Work-up currently is negative.  Neurology saw the patient and are planning for MRI brain once representative is located to reprogram the brain stimulator.  MRI brain is planned for tomorrow morning, [employer] rep will be available to reprogram the device.  He is slowly improving.  Ammonia level 18 .follow vitamin B1 levels.  Seroquel has been changed to as needed.  Ambien dose increased to 7.5 mg daily.  Appreciate neurology assistance. 2. Hyponatremia-patient sodium was 133 on 06/17/2020.  BMP ordered today.  Will follow. 3. Parkinson disease-continue Sinemet.  S/p deep brain stimulator implant on 06/06/2020. 4. Hypertension-blood pressure stable, continue lisinopril 5. Hypothyroidism-TSH is 0.164, TSH 1.85.  We will cut down the dose of levothyroxine from 150 mcg to 125 mcg daily.  Check TSH in 6 to 8 weeks. 6. B12 deficiency-B12 level 69.  B12 1000 mcg IM daily for 7 days then 1000 mcg oral daily.  Intrinsic factor antibody 1.1.   Scheduled medications:   . aspirin  81 mg Oral Daily  . carbidopa-levodopa  1 tablet Oral QID  . chlorhexidine  15 mL Mouth/Throat BID  . cyanocobalamin  1,000 mcg Intramuscular  Daily  . docusate  100 mg Oral TID  . feeding supplement (ENSURE ENLIVE)  237 mL Oral BID BM  . ferrous sulfate  300 mg Oral BID WC  . levothyroxine  125 mcg Oral QAC breakfast  . lisinopril  5 mg Oral QPM  . multivitamin  15 mL Oral Daily  . oxyCODONE  15 mg Oral QID  . trihexyphenidyl  2 mg Oral TID WC      CBG: No results for input(s): GLUCAP in the last 168 hours.  SpO2: 100 %    CBC: Recent Labs  Lab 06/16/20 1535  WBC 5.0  NEUTROABS 2.4  HGB 12.4*  HCT 37.9*  MCV 91.3  PLT 283    Basic Metabolic Panel: Recent Labs  Lab 06/16/20 1535 06/17/20 0247  NA 133* 133*  K 4.1 3.9  CL 96* 97*  CO2 24 26  GLUCOSE 122* 129*  BUN 18 13  CREATININE 0.67 0.73  CALCIUM 9.5 9.1     Liver Function Tests: Recent Labs  Lab 06/16/20 1535  AST 19  ALT 5  ALKPHOS 110  BILITOT 0.7  PROT 7.4  ALBUMIN 3.8     Antibiotics: Anti-infectives (From admission, onward)   None       DVT prophylaxis: SCDs  Code Status: Full code  Family Communication: Discussed with patient's wife at bedside    Status is: Inpatient  Dispo: The patient is from: Home              Anticipated d/c is to: Home versus skilled nursing  facility              Anticipated d/c date is: 06/21/2020              Patient currently not medically stable for discharge  Barrier to discharge-ongoing work-up for delirium     Consultants:  Neurosurgery  Procedures:     Objective   Vitals:   06/19/20 1954 06/19/20 2351 06/20/20 0334 06/20/20 0856  BP: 113/72 108/69 138/71 126/68  Pulse: 85 67 67 81  Resp: 17 18 17 18   Temp: 98 F (36.7 C) 98.1 F (36.7 C) 98.1 F (36.7 C) 98.4 F (36.9 C)  TempSrc: Oral Oral Oral Oral  SpO2: 100% 100% 99% 100%  Weight:      Height:        Intake/Output Summary (Last 24 hours) at 06/20/2020 1046 Last data filed at 06/19/2020 1805 Gross per 24 hour  Intake 120 ml  Output --  Net 120 ml    08/08 1901 - 08/10 0700 In: 120 [P.O.:120] Out:  -   Filed Weights   06/17/20 0600  Weight: 77.1 kg    Physical Examination:    General-appears in no acute distress  Heart-S1-S2, regular, no murmur auscultated  Lungs-clear to auscultation bilaterally, no wheezing or crackles auscultated  Abdomen-soft, nontender, no organomegaly  Extremities-no edema in the lower extremities  Neuro-alert, oriented x 2, no focal deficit noted    Data Reviewed:   Recent Results (from the past 240 hour(s))  SARS Coronavirus 2 by RT PCR (hospital order, performed in [facility] hospital lab) Nasopharyngeal Nasopharyngeal Swab     Status: None   Collection Time: 06/16/20  7:17 PM   Specimen: Nasopharyngeal Swab  Result Value Ref Range Status   SARS Coronavirus 2 NEGATIVE NEGATIVE Final    Comment: (NOTE) SARS-CoV-2 target nucleic acids are NOT DETECTED.  The SARS-CoV-2 RNA is generally detectable in upper and lower respiratory specimens during the acute phase of infection. The lowest concentration of SARS-CoV-2 viral copies this assay can detect is 250 copies / mL. A negative result does not preclude SARS-CoV-2 infection and should not be used as the sole basis for treatment or other patient management decisions.  A negative result may occur with improper specimen collection / handling, submission of specimen other than nasopharyngeal swab, presence of viral mutation(s) within the areas targeted by this assay, and inadequate number of viral copies (<250 copies / mL). A negative result must be combined with clinical observations, patient history, and epidemiological information.  Fact Sheet for Patients:   08/16/20  Fact Sheet for Healthcare Providers: BoilerBrush.com.cy  This test is not yet approved or  cleared by the https://pope.com/ FDA and has been authorized for detection and/or diagnosis of SARS-CoV-2 by FDA under an Emergency Use Authorization (EUA).  This EUA will  remain in effect (meaning this test can be used) for the duration of the COVID-19 declaration under Section 564(b)(1) of the Act, 21 U.S.C. section 360bbb-3(b)(1), unless the authorization is terminated or revoked sooner.  Performed at [facility], [address], [place], [place]     No results for input(s): LIPASE, AMYLASE in the last 168 hours. Recent Labs  Lab 06/18/20 1543  AMMONIA 18     Miking Usrey S Dishon Kehoe   [facility] If 7PM-7AM, please contact night-coverage at [email or web address], Office  [phone]   06/20/2020, 10:46 AM  LOS: 3 days

## 2020-06-21 ENCOUNTER — Other Ambulatory Visit: Payer: Self-pay

## 2020-06-21 ENCOUNTER — Inpatient Hospital Stay (HOSPITAL_COMMUNITY): Payer: No Typology Code available for payment source

## 2020-06-21 ENCOUNTER — Inpatient Hospital Stay (HOSPITAL_COMMUNITY)
Admission: RE | Admit: 2020-06-21 | Discharge: 2020-07-05 | DRG: 945 | Disposition: A | Discharge status: Home Health | Payer: No Typology Code available for payment source | Source: Intra-hospital | Attending: Physical Medicine and Rehabilitation | Admitting: Physical Medicine and Rehabilitation

## 2020-06-21 ENCOUNTER — Encounter (HOSPITAL_COMMUNITY): Payer: Self-pay | Admitting: Physical Medicine and Rehabilitation

## 2020-06-21 DIAGNOSIS — G8929 Other chronic pain: Secondary | ICD-10-CM | POA: Diagnosis present

## 2020-06-21 DIAGNOSIS — M549 Dorsalgia, unspecified: Secondary | ICD-10-CM

## 2020-06-21 DIAGNOSIS — G479 Sleep disorder, unspecified: Secondary | ICD-10-CM | POA: Diagnosis not present

## 2020-06-21 DIAGNOSIS — Z79890 Hormone replacement therapy (postmenopausal): Secondary | ICD-10-CM | POA: Diagnosis not present

## 2020-06-21 DIAGNOSIS — G936 Cerebral edema: Secondary | ICD-10-CM

## 2020-06-21 DIAGNOSIS — E538 Deficiency of other specified B group vitamins: Secondary | ICD-10-CM | POA: Diagnosis present

## 2020-06-21 DIAGNOSIS — I10 Essential (primary) hypertension: Secondary | ICD-10-CM | POA: Diagnosis present

## 2020-06-21 DIAGNOSIS — Z79899 Other long term (current) drug therapy: Secondary | ICD-10-CM

## 2020-06-21 DIAGNOSIS — R410 Disorientation, unspecified: Secondary | ICD-10-CM

## 2020-06-21 DIAGNOSIS — G20 Parkinson's disease: Secondary | ICD-10-CM | POA: Diagnosis present

## 2020-06-21 DIAGNOSIS — E871 Hypo-osmolality and hyponatremia: Secondary | ICD-10-CM | POA: Diagnosis present

## 2020-06-21 DIAGNOSIS — M1990 Unspecified osteoarthritis, unspecified site: Secondary | ICD-10-CM | POA: Diagnosis present

## 2020-06-21 DIAGNOSIS — E039 Hypothyroidism, unspecified: Secondary | ICD-10-CM | POA: Diagnosis present

## 2020-06-21 DIAGNOSIS — K219 Gastro-esophageal reflux disease without esophagitis: Secondary | ICD-10-CM | POA: Diagnosis present

## 2020-06-21 DIAGNOSIS — Z87891 Personal history of nicotine dependence: Secondary | ICD-10-CM | POA: Diagnosis not present

## 2020-06-21 DIAGNOSIS — Z7982 Long term (current) use of aspirin: Secondary | ICD-10-CM | POA: Diagnosis not present

## 2020-06-21 DIAGNOSIS — R5381 Other malaise: Secondary | ICD-10-CM | POA: Diagnosis present

## 2020-06-21 DIAGNOSIS — R4587 Impulsiveness: Secondary | ICD-10-CM | POA: Diagnosis present

## 2020-06-21 DIAGNOSIS — Z96612 Presence of left artificial shoulder joint: Secondary | ICD-10-CM | POA: Diagnosis present

## 2020-06-21 DIAGNOSIS — G20A1 Parkinson's disease without dyskinesia, without mention of fluctuations: Secondary | ICD-10-CM | POA: Diagnosis present

## 2020-06-21 MED ORDER — ONDANSETRON HCL 4 MG PO TABS: 4.0000 mg | ORAL_TABLET | Freq: Four times a day (QID) | ORAL | Status: DC | PRN

## 2020-06-21 MED ORDER — TRIHEXYPHENIDYL HCL 2 MG PO TABS
2.0000 mg | ORAL_TABLET | Freq: Three times a day (TID) | ORAL | Status: DC
  Administered 2020-06-21 – 2020-07-05 (×41): 2 mg via ORAL
  Filled 2020-06-21 (×42): qty 1

## 2020-06-21 MED ORDER — LISINOPRIL 5 MG PO TABS
5.0000 mg | ORAL_TABLET | Freq: Every evening | ORAL | Status: DC
  Administered 2020-06-21 – 2020-07-04 (×14): 5 mg via ORAL
  Filled 2020-06-21 (×14): qty 1

## 2020-06-21 MED ORDER — GADOBUTROL 1 MMOL/ML IV SOLN
7.0000 mL | Freq: Once | INTRAVENOUS | Status: AC | PRN
  Administered 2020-06-21: 7 mL via INTRAVENOUS

## 2020-06-21 MED ORDER — POLYETHYLENE GLYCOL 3350 17 G PO PACK: 17.0000 g | PACK | Freq: Every day | ORAL | 0 refills | Status: DC | PRN

## 2020-06-21 MED ORDER — CYCLOBENZAPRINE HCL 10 MG PO TABS
10.0000 mg | ORAL_TABLET | Freq: Three times a day (TID) | ORAL | Status: DC | PRN
  Administered 2020-06-22 – 2020-06-26 (×3): 10 mg via ORAL
  Filled 2020-06-21 (×4): qty 1

## 2020-06-21 MED ORDER — ADULT MULTIVITAMIN LIQUID CH
15.0000 mL | Freq: Every day | ORAL | Status: DC
  Administered 2020-06-22 – 2020-06-28 (×7): 15 mL via ORAL
  Filled 2020-06-21 (×7): qty 15

## 2020-06-21 MED ORDER — OXYCODONE HCL 5 MG PO TABS
15.0000 mg | ORAL_TABLET | Freq: Four times a day (QID) | ORAL | Status: DC
  Administered 2020-06-21 – 2020-06-22 (×5): 15 mg via ORAL
  Filled 2020-06-21 (×5): qty 3

## 2020-06-21 MED ORDER — LEVOTHYROXINE SODIUM 25 MCG PO TABS
125.0000 ug | ORAL_TABLET | Freq: Every day | ORAL | Status: DC
  Administered 2020-06-22 – 2020-07-05 (×14): 125 ug via ORAL
  Filled 2020-06-21 (×14): qty 1

## 2020-06-21 MED ORDER — OLOPATADINE HCL 0.1 % OP SOLN: 1.0000 [drp] | Freq: Two times a day (BID) | OPHTHALMIC | Status: DC | PRN

## 2020-06-21 MED ORDER — ENSURE ENLIVE PO LIQD
237.0000 mL | Freq: Two times a day (BID) | ORAL | Status: DC
  Administered 2020-06-22 – 2020-07-04 (×22): 237 mL via ORAL

## 2020-06-21 MED ORDER — QUETIAPINE FUMARATE 25 MG PO TABS
12.5000 mg | ORAL_TABLET | Freq: Every evening | ORAL | Status: DC | PRN
  Administered 2020-06-21 – 2020-06-27 (×6): 12.5 mg via ORAL
  Filled 2020-06-21 (×6): qty 1

## 2020-06-21 MED ORDER — CARBIDOPA-LEVODOPA 25-100 MG PO TABS
1.0000 | ORAL_TABLET | Freq: Four times a day (QID) | ORAL | Status: DC
  Administered 2020-06-21 – 2020-07-05 (×55): 1 via ORAL
  Filled 2020-06-21 (×54): qty 1

## 2020-06-21 MED ORDER — LEVOTHYROXINE SODIUM 125 MCG PO TABS: 125.0000 ug | ORAL_TABLET | Freq: Every day | ORAL | 0 refills | Status: DC

## 2020-06-21 MED ORDER — ASPIRIN 81 MG PO CHEW: 81.0000 mg | CHEWABLE_TABLET | Freq: Every day | ORAL | Status: DC

## 2020-06-21 MED ORDER — CYANOCOBALAMIN 1000 MCG/ML IJ SOLN
1000.0000 ug | Freq: Every day | INTRAMUSCULAR | Status: AC
  Administered 2020-06-22 – 2020-06-24 (×3): 1000 ug via INTRAMUSCULAR
  Filled 2020-06-21 (×3): qty 1

## 2020-06-21 MED ORDER — FERROUS SULFATE 300 (60 FE) MG/5ML PO SYRP
300.0000 mg | ORAL_SOLUTION | Freq: Two times a day (BID) | ORAL | Status: DC
  Administered 2020-06-21 – 2020-06-28 (×15): 300 mg via ORAL
  Filled 2020-06-21 (×16): qty 5

## 2020-06-21 MED ORDER — ZOLPIDEM TARTRATE 5 MG PO TABS
7.5000 mg | ORAL_TABLET | Freq: Every evening | ORAL | Status: DC | PRN
  Administered 2020-06-21 – 2020-06-25 (×4): 7.5 mg via ORAL
  Filled 2020-06-21 (×4): qty 2

## 2020-06-21 MED ORDER — ONDANSETRON HCL 4 MG/2ML IJ SOLN: 4.0000 mg | Freq: Four times a day (QID) | INTRAMUSCULAR | Status: DC | PRN

## 2020-06-21 MED ORDER — DOCUSATE SODIUM 50 MG/5ML PO LIQD
100.0000 mg | Freq: Three times a day (TID) | ORAL | Status: DC
  Administered 2020-06-21 – 2020-07-01 (×17): 100 mg via ORAL
  Filled 2020-06-21 (×24): qty 10

## 2020-06-21 MED ORDER — POLYETHYLENE GLYCOL 3350 17 G PO PACK: 17.0000 g | PACK | Freq: Every day | ORAL | Status: DC | PRN

## 2020-06-21 MED ORDER — ENSURE ENLIVE PO LIQD: 237.0000 mL | Freq: Two times a day (BID) | ORAL | 0 refills | Status: DC

## 2020-06-21 MED ORDER — ACETAMINOPHEN 500 MG PO TABS
500.0000 mg | ORAL_TABLET | Freq: Four times a day (QID) | ORAL | Status: DC | PRN
  Administered 2020-06-27 – 2020-07-05 (×6): 500 mg via ORAL
  Filled 2020-06-21 (×6): qty 1

## 2020-06-21 NOTE — PMR Pre-admission (Addendum)
PMR Admission Coordinator Pre-Admission Assessment  Patient: Reginald Jenkins is an 74 y.o., male MRN: [id number] DOB: [birth year] Height: 5' 7.5" (171.5 cm) Weight: 77.1 kg              Insurance Information HMO:     PPO:      PCP:      IPA:      80/20:      OTHER:  PRIMARY: Medicare a and b      Policy#: [id number]      Subscriber: pt Benefits:  Phone #: passport one online     Name: 06/20/2020 Eff. Date: 05/11/2006     Deduct: $1484      Out of Pocket Max: none      Life Max: none  CIR: 100%      SNF: 20 full days Outpatient: 80%     Co-Pay: 20% Home Health: 100%      Co-Pay: none DME: 80%     Co-Pay: 20% Providers: pt choice  SECONDARY: [facility]      Policy#: [id number]      Phone#: pt  Financial Counselor:       Phone#:   The "Data Collection Information Summary" for patients in Inpatient Rehabilitation Facilities with attached "Privacy Act Statement-Health Care Records" was provided and verbally reviewed with: Patient and Family  Emergency Contact Information Contact Information     Name Relation Home Work Mobile   Shaune LeeksGentry, Sue Spouse [phone]  [phone]      Current Medical History  Patient Admitting Diagnosis: Parkinson's disease with recent DBS placement  History of Present Illness:HPI: Reginald Conchhomas F Greenwell is a 74 year old right-handed male history of Parkinson's disease maintained on Sinemet as well as Artane followed by neurology Dr. Myer [place]eringler in [place] status [facility] 06/06/2020, hypertension and hypothyroidism as well as chronic back pain maintained on oxycodone 15 mg 4 times daily followed by Dr. Erskin BurnetLawrence J Winnuker in [place].  Reportedly recently independent with steady decline in functional mobility as well as cognitive deficits.  1 level home with ramped entrance.  Cranial CT scan showed bilateral stimulator device seen extending from the previously described bifrontal craniotomies with tips in the expected  position of the substantia nigra bilaterally.  Mild low density seen involving the subcortical white matter around the stimulator leads in both frontal lobes most consistent with postoperative edema.  No hemorrhage or definite acute infarct noted.  Admission chemistry sodium 133 glucose 122 hemoglobin 12.4, TSH 0.164 and free T4 1.85, urinalysis negative nitrite, sedimentation rate 39, ammonia level 18, vitamin B12 69.  Neurology service as well as neurosurgery consulted with follow-up cranial CT scan 06/19/2020 stable bifrontal neurostimulator leads white matter low density surrounding leads in both cerebral hemispheres slightly less prominent than prior tracing.  Stable ill-defined low-density in the midbrain and pons bilaterally possibly representing ischemic changes.  Stable ill-defined low-density right occipital lobe possibly representing area of a subacute infarction and recommendations remained to continue to monitor and did remain on low-dose aspirin.  MRI on 06/21/2020 results pending as discussed with [employer] representative for DBS device for possible need for reprogramming.  [name] delirium agitation has improved and his Seroquel was discontinued.  He was on low-dose Ambien as prior to admission reduced to 5 mg.  No current plan for EEG.  Patient was placed on B12 1000 mcg intramuscularly daily x7 days.    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  Prior Rehab/Hospitalizations:  Has the patient had prior rehab or hospitalizations prior to admission? Yes  Has the patient had major surgery during 100 days prior  to admission? Yes  Current Medications   Current Facility-Administered Medications:    acetaminophen (TYLENOL) tablet 500 mg, 500 mg, Oral, Q6H PRN, Hillary Bow, DO, 500 mg at 06/20/20 0109   aspirin chewable tablet 81 mg, 81 mg, Oral, Daily, Spongberg, Susy Frizzle, MD, 81 mg at 06/21/20 0859   carbidopa-levodopa (SINEMET IR) 25-100 MG per tablet immediate release 1 tablet, 1 tablet, Oral, QID, Julian Reil, Jared M, DO, 1 tablet at 06/21/20 1338   chlorhexidine (PERIDEX) 0.12 % solution 15 mL, 15 mL, Mouth/Throat, BID, Julian Reil, Jared M, DO, 15 mL at 06/21/20 0857   cyanocobalamin ((VITAMIN B-12)) injection 1,000 mcg, 1,000 mcg, Intramuscular, Daily, Bhagat, Srishti L, MD, 1,000 mcg at 06/21/20 0857   cyclobenzaprine (FLEXERIL) tablet 10 mg, 10 mg, Oral, TID PRN, Hillary Bow, DO, 10 mg at 06/20/20 2056   docusate (COLACE) 50 MG/5ML liquid 100 mg, 100 mg, Oral, TID, Spongberg, Susy Frizzle, MD, 100 mg at 06/21/20 0856   feeding supplement (ENSURE ENLIVE) (ENSURE ENLIVE) liquid 237 mL, 237 mL, Oral, BID BM, Julian Reil, Jared M, DO, 237 mL at 06/21/20 0858   ferrous sulfate 300 (60 Fe) MG/5ML syrup 300 mg, 300 mg, Oral, BID WC, Spongberg, Susy Frizzle, MD, 300 mg at 06/21/20 0857   levothyroxine (SYNTHROID) tablet 125 mcg, 125 mcg, Oral, QAC breakfast, Meredeth Ide, MD, 125 mcg at 06/21/20 0532   lisinopril (ZESTRIL) tablet 5 mg, 5 mg, Oral, QPM, Julian Reil, Jared M, DO, 5 mg at 06/20/20 1815   multivitamin liquid 15 mL, 15 mL, Oral, Daily, Spongberg, Susy Frizzle, MD, 15 mL at 06/21/20 0857   olopatadine (PATANOL) 0.1 % ophthalmic solution 1 drop, 1 drop, Both Eyes, BID PRN, Julian Reil, Jared M, DO   ondansetron (ZOFRAN) tablet 4 mg, 4 mg, Oral, Q6H PRN **OR** ondansetron (ZOFRAN) injection 4 mg, 4 mg, Intravenous, Q6H PRN, Julian Reil, Jared M, DO   oxyCODONE (Oxy IR/ROXICODONE) immediate release tablet 15 mg, 15 mg, Oral, QID, Julian Reil, Jared M, DO, 15 mg at 06/20/20 2056   polyethylene glycol  (MIRALAX / GLYCOLAX) packet 17 g, 17 g, Oral, Daily PRN, Marzetta Board, MD, 17 g at 06/20/20 2057   QUEtiapine (SEROQUEL) tablet 12.5 mg, 12.5 mg, Oral, QHS PRN, Bhagat, Srishti L, MD, 12.5 mg at 06/20/20 2056   trihexyphenidyl (ARTANE) tablet 2 mg, 2 mg, Oral, TID WC, Julian Reil, Jared M, DO, 2 mg at 06/21/20 1338   zolpidem (AMBIEN) tablet 7.5 mg, 7.5 mg, Oral, QHS PRN, Bhagat, Srishti L, MD, 7.5 mg at 06/20/20 2250  Patients Current Diet:  Diet Order             Diet Heart Room service appropriate? Yes; Fluid consistency: Thin  Diet effective now                   Precautions / Restrictions Precautions Precautions: Fall Precaution Comments: wife  denies fall history  Restrictions Weight Bearing Restrictions: No   Has the patient had 2 or more falls or a fall with injury in the past year?No  Prior Activity Level Community (5-7x/wk): Indpendent without AD before placement of DBS  Prior Functional Level Prior Function Level of Independence: Independent Gait / Transfers Assistance Needed: Not using an AD PTA  Self Care: Did the patient need help bathing, dressing, using the toilet or eating?  Independent  Indoor Mobility: Did the patient need assistance with walking from room to room (with or without device)? Independent  Stairs: Did the patient need assistance with internal or external stairs (with or without device)? Independent  Functional Cognition: Did the patient need help planning regular tasks such as shopping or remembering to take medications? Independent  Home Assistive Devices / Equipment Home Assistive Devices/Equipment: None Home Equipment: Shower seat - built in Engineering geologist. User may not have seen previous data.)  Prior Device Use: Indicate devices/aids used by the patient prior to current illness, exacerbation or injury? None of the above  Current Functional Level Cognition  Overall Cognitive Status: Impaired/Different from  baseline Current Attention Level: Focused Orientation Level: Oriented to person Following Commands: Follows one step commands inconsistently, Follows one step commands with increased time General Comments: keeping eyes open for longer periods of time today.  answering most questions today but with significant delay and often having the question repeated.  initiation noted with simple grooming task but with perserveration and min/mod physical and verbal guidance to progress the task or action.    Extremity Assessment (includes Sensation/Coordination)  Upper Extremity Assessment: RUE deficits/detail, LUE deficits/detail RUE Deficits / Details: tremor noted which wife reports is worse than baseline.  He demonstrates full AROM   RUE Coordination: decreased fine motor, decreased gross motor LUE Deficits / Details: full AROM noted.  Significant motor planning deficits noted  LUE Coordination: decreased gross motor, decreased fine motor  Lower Extremity Assessment: Defer to PT evaluation    ADLs  Overall ADL's : Needs assistance/impaired Eating/Feeding: Total assistance, Sitting Grooming: Wash/dry hands, Moderate assistance, Standing Grooming Details (indicate cue type and reason): able to initiate reaching for soap, unable to determine if he wanted 3x of soap or unable to transition to next step of hand washing. physical assistance required to move L hand and bring to R hand to begin hand washing.  able to lather and move hands together in washing motion.  assistance for drying.  offered face washing and oral care pt. replied "no" Upper Body Bathing: Total assistance, Sitting Lower Body Bathing: Total assistance, Sit to/from stand Upper Body Dressing : Total assistance, Sitting Lower Body Dressing: Total assistance, Sit to/from stand Toilet Transfer: Min guard, Minimal assistance, Cueing for sequencing, Stand-pivot, Regular Toilet, BSC, Grab bars Toilet Transfer Details (indicate cue type and  reason): intermittent furniture walking utilized from sink and grab bars while entering the b.room.  max cues to take backwards steps to the toilet before sitting down. Toileting- Clothing Manipulation and Hygiene: Moderate assistance, Sit to/from stand Toileting - Clothing Manipulation Details (indicate cue type and reason): reports his wife usually assists with undergarments but used R hand to assist with pulling up right side of brief after urinating Functional mobility during ADLs: Minimal assistance, +2 for safety/equipment, +2 for physical assistance General ADL Comments: assisting with toileting and grooming tasks. noted improvement from yesterdays session    Mobility  Overal bed mobility: Needs Assistance Bed Mobility: Sit to Supine Supine to sit: Min assist Sit  to supine: Supervision General bed mobility comments: Pt sitting up in chair with wife present at the time of PT arrival. Pt returned to supine with supervision and assist from therapist to manage linen.     Transfers  Overall transfer level: Needs assistance Equipment used: None Transfers: Sit to/from Stand Sit to Stand: Min guard Stand pivot transfers: Min assist General transfer comment: Pt was able to power up to full stand without assistance. Close hands on guard provided for safety but no unsteadiness noted.     Ambulation / Gait / Stairs / Wheelchair Mobility  Ambulation/Gait Ambulation/Gait assistance: Editor, commissioning (Feet): 200 Feet Assistive device: 1 person hand held assist Gait Pattern/deviations: Shuffle, Festinating, Trunk flexed, Narrow base of support General Gait Details: Pt maintaining long step/stride length with minimal cues, however does get somewhat stuck when negotiating in tighter areas (bathroom, turns at EOB). Assist for direction and to lead pt away frrom an instance where he may be "stuck".  Gait velocity: Decreased Gait velocity interpretation: <1.31 ft/sec, indicative of household  ambulator    Posture / Balance Dynamic Sitting Balance Sitting balance - Comments: anterior lean/trunk flexion noted as pt sat in the chair for longer period.  Balance Overall balance assessment: Needs assistance Sitting-balance support: Feet supported, No upper extremity supported Sitting balance-Leahy Scale: Fair Sitting balance - Comments: anterior lean/trunk flexion noted as pt sat in the chair for longer period.  Standing balance support: No upper extremity supported, During functional activity Standing balance-Leahy Scale: Fair Standing balance comment: As pt fatigued, poor    Special needs/care consideration Daily visitor is wife, Fannie Knee and she has been given permission to stay overnight per Dr. Allena Katz due to patient safety needs Recent placement of DBS at [facility], [place] Fall risk   Previous Home Environment  Living Arrangements: Spouse/significant other  Lives With: Spouse Available Help at Discharge: Family, Available 24 hours/day Type of Home: House Home Layout: One level Home Access: Ramped entrance (in back) Bathroom Shower/Tub: Health visitor: Handicapped height Bathroom Accessibility: Yes How Accessible: Accessible via walker Home Care Services: No Additional Comments: wife reports that the [place] is getting them a shower seat and she is having someone install grab bars in the bathroom   Discharge Living Setting Plans for Discharge Living Setting: [name] home, Lives with (comment) Type of Home at Discharge: House Discharge Home Layout: One level Discharge Home Access: Ramped entrance Discharge Bathroom Shower/Tub: Walk-in shower Discharge Bathroom Toilet: Handicapped height Does the patient have any problems obtaining your medications?: No  Social/Family/Support Systems Patient Roles: Spouse Contact Information: wife, Fannie Knee Anticipated Caregiver: wife Anticipated Industrial/product designer Information: see above Ability/Limitations of Caregiver:  none Caregiver Availability: 24/7 Discharge Plan Discussed with Primary Caregiver: Yes Is Caregiver In Agreement with Plan?: Yes Does Caregiver/Family have Issues with Lodging/Transportation while Pt is in Rehab?: No  Goals Patient/Family Goal for Rehab: Mod I/supervision with PT, OT, and SLP Expected length of stay: ELOS 7-11 days Pt/Family Agrees to Admission and willing to participate: Yes Program Orientation Provided & Reviewed with Pt/Caregiver Including Roles  & Responsibilities: Yes  Decrease burden of Care through IP rehab admission: n/a  Possible need for SNF placement upon discharge:not anticipated  Patient Condition: This patient's condition remains as documented in the consult dated 06/19/2020, in which the Rehabilitation Physician determined and documented that the patient's condition is appropriate for intensive rehabilitative care in an inpatient rehabilitation facility. Will admit to inpatient rehab today.  Preadmission Screen Completed By:  Ottie Glazier  Courtney Paris, RN, 06/21/2020 1:44 PM ______________________________________________________________________   Discussed status with Dr. Allena Katz on 06/21/2020 at  1418 and received approval for admission today.  Admission Coordinator:  Clois Dupes, time 9211 Date 06/21/2020

## 2020-06-21 NOTE — Progress Notes (Signed)
Inpatient Rehabilitation Medication Review by a Pharmacist  A complete drug regimen review was completed for this patient to identify any potential clinically significant medication issues.  Clinically significant medication issues were identified:  no  Check AMION for pharmacist assigned to patient if future medication questions/issues arise during this admission.  Pharmacist comments:   Time spent performing this drug regimen review (minutes):  5min   Lalena Salas, PharmD., MS Clinical Pharmacist Thank you for allowing pharmacy to be part of this patients care team.  06/21/2020 5:20 PM   

## 2020-06-21 NOTE — H&P (Signed)
Physical Medicine and Rehabilitation Admission H&P       Chief Complaint  Patient presents with  . Weakness  : HPI: Reginald Jenkins is a 74 year old right-handed male history of Parkinson's disease maintained on Sinemet as well as Artane followed by neurology Dr. Myer Peer in [facility] status post brain stimulator placement at [facility] on 06/06/2020, hypertension and hypothyroidism as well as chronic back pain maintained on oxycodone 15 mg 4 times daily followed by Dr. Erskin Burnet in [place].  History taken from chart review and wife due to cognition.  Patient lives with spouse.  Reportedly recently independent with steady decline in functional mobility as well as cognitive deficits.  1 level home with ramped entrance.  Cranial CT scan showed bilateral stimulator device seen extending from the previously described bifrontal craniotomies with tips in the expected position of the substantia nigra bilaterally.  Mild low density seen involving the subcortical white matter around the stimulator leads in both frontal lobes most consistent with postoperative edema.  No hemorrhage or definite acute infarct noted.  Admission chemistry sodium 133, glucose 122 hemoglobin 12.4, TSH 0.164 and free T4 1.85, urinalysis negative nitrite, sedimentation rate 39, ammonia level 18, vitamin B12 69.  Neurology service as well as neurosurgery consulted with follow-up cranial CT scan 06/19/2020 stable bifrontal neurostimulator leads white matter low density surrounding leads in both cerebral hemispheres slightly less prominent than prior tracing.  Stable ill-defined low-density in the midbrain and pons bilaterally possibly representing ischemic changes.  Stable ill-defined low-density right occipital lobe possibly representing area of a subacute infarction and recommendations remained to continue to monitor and did remain on low-dose aspirin.  Follow-up MRI on 06/21/2020 showing edema around  DBS leads bilaterally, no suspected infection.  Small bilateral occipital subdural hematomas. [name] delirium agitation has improved and his Seroquel was discontinued.  He was on low-dose Ambien as prior to admission reduced to 5 mg.  No current plan for EEG.  Patient was placed on B12 1000 mcg intramuscularly daily x7 days.  Therapy evaluations completed and patient was admitted for a comprehensive rehab program.  Please see preadmission assessment from earlier today as well.  Review of Systems  Unable to perform ROS: Mental acuity       Past Medical History:  Diagnosis Date  . Anemia    borderline  . 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 Arthritis   . Broken collarbone    as a child  . Dyspnea    when climbing stairs   . GERD (gastroesophageal reflux disease)   . Hypertension   . Hypothyroidism   . Parkinson disease (HCC) 2010        Past Surgical History:  Procedure Laterality Date  . APPENDECTOMY    . arm surgery Left 1967   a rod was put in then 8 months it was removed  . CHOLECYSTECTOMY    . LAMINECTOMY  1986  . ROTATOR CUFF REPAIR Right 2001  . 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   Procedure: LEFT TOTAL SHOULDER ARTHROPLASTY;  Surgeon: Francena Hanly, MD;  Location: [facility] OR;  Service: Orthopedics;  Laterality: Left;   No pertinent family history of DBS. Social History:  reports that he quit smoking about 53 years ago. His smoking use included cigarettes. He has a 1.50 pack-year smoking history. He quit smokeless tobacco use about 31 years ago.  His smokeless tobacco use included chew. He reports that he does not drink alcohol and  does not use drugs. Allergies: No Known Allergies       Medications Prior to Admission  Medication Sig Dispense Refill  . acetaminophen (TYLENOL) 500 MG tablet Take 500 mg by mouth every 6 (six) hours as needed (for pain.).    Marland Kitchen aspirin EC 81 MG tablet Take 81 mg by mouth at bedtime.    . carbidopa-levodopa  (SINEMET IR) 25-100 MG tablet Take 1 tablet by mouth 4 (four) times daily.    . cyclobenzaprine (FLEXERIL) 10 MG tablet Take 10 mg by mouth 3 (three) times daily as needed for muscle spasms.    Marland Kitchen docusate sodium (COLACE) 100 MG capsule Take 100 mg by mouth 3 (three) times daily.    . ferrous sulfate 325 (65 FE) MG tablet Take 325 mg by mouth 2 (two) times daily.    Marland Kitchen levothyroxine (SYNTHROID, LEVOTHROID) 150 MCG tablet Take 150 mcg by mouth daily before breakfast.    . lisinopril (PRINIVIL,ZESTRIL) 5 MG tablet Take 5 mg by mouth every evening.    . Multiple Vitamin (MULTIVITAMIN WITH MINERALS) TABS tablet Take 1 tablet by mouth daily. Centrum    . olopatadine (PATANOL) 0.1 % ophthalmic solution Place 1 drop into both eyes 2 (two) times daily as needed for allergies.    Marland Kitchen oxyCODONE (ROXICODONE) 15 MG immediate release tablet Take 15 mg by mouth 4 (four) times daily.    . trihexyphenidyl (ARTANE) 2 MG tablet Take 2 mg by mouth 3 (three) times daily with meals.     Marland Kitchen zolpidem (AMBIEN CR) 12.5 MG CR tablet Take 12.5 mg by mouth at bedtime.      Drug Regimen Review Drug regimen was reviewed and remains appropriate with no significant issues identified  Home: Home Living Family/patient expects to be discharged to:: Private residence Living Arrangements: Spouse/significant other Available Help at Discharge: Family, Available 24 hours/day Type of Home: House Home Access: Ramped entrance (in back) Home Layout: One level Bathroom Shower/Tub: Health visitor: Handicapped height Home Equipment: Shower seat - built in Engineering geologist. User may not have seen previous data.) Additional Comments: wife reports that the [place] is getting them a shower seat and she is having someone install grab bars in the bathroom    Functional History: Prior Function Level of Independence: Independent Gait / Transfers Assistance Needed: Not using an AD PTA  Functional  Status:  Mobility: Bed Mobility Overal bed mobility: Needs Assistance Bed Mobility: Sit to Supine Supine to sit: Min assist Sit to supine: Supervision General bed mobility comments: Pt sitting up in chair with wife present at the time of PT arrival. Pt returned to supine with supervision and assist from therapist to manage linen.  Transfers Overall transfer level: Needs assistance Equipment used: None Transfers: Sit to/from Stand Sit to Stand: Min guard Stand pivot transfers: Min assist General transfer comment: Pt was able to power up to full stand without assistance. Close hands on guard provided for safety but no unsteadiness noted.  Ambulation/Gait Ambulation/Gait assistance: Min assist Gait Distance (Feet): 200 Feet Assistive device: 1 person hand held assist Gait Pattern/deviations: Shuffle, Festinating, Trunk flexed, Narrow base of support General Gait Details: Pt maintaining long step/stride length with minimal cues, however does get somewhat stuck when negotiating in tighter areas (bathroom, turns at EOB). Assist for direction and to lead pt away frrom an instance where he may be "stuck".  Gait velocity: Decreased Gait velocity interpretation: <1.31 ft/sec, indicative of household ambulator  ADL: ADL Overall ADL's : Needs assistance/impaired  Eating/Feeding: Total assistance, Sitting Grooming: Wash/dry hands, Moderate assistance, Standing Grooming Details (indicate cue type and reason): able to initiate reaching for soap, unable to determine if he wanted 3x of soap or unable to transition to next step of hand washing. physical assistance required to move L hand and bring to R hand to begin hand washing.  able to lather and move hands together in washing motion.  assistance for drying.  offered face washing and oral care pt. replied "no" Upper Body Bathing: Total assistance, Sitting Lower Body Bathing: Total assistance, Sit to/from stand Upper Body Dressing : Total assistance,  Sitting Lower Body Dressing: Total assistance, Sit to/from stand Toilet Transfer: Min guard, Minimal assistance, Cueing for sequencing, Stand-pivot, Regular Toilet, BSC, Grab bars Toilet Transfer Details (indicate cue type and reason): intermittent furniture walking utilized from sink and grab bars while entering the b.room.  max cues to take backwards steps to the toilet before sitting down. Toileting- Clothing Manipulation and Hygiene: Moderate assistance, Sit to/from stand Toileting - Clothing Manipulation Details (indicate cue type and reason): reports his wife usually assists with undergarments but used R hand to assist with pulling up right side of brief after urinating Functional mobility during ADLs: Minimal assistance, +2 for safety/equipment, +2 for physical assistance General ADL Comments: assisting with toileting and grooming tasks. noted improvement from yesterdays session  Cognition: Cognition Overall Cognitive Status: Impaired/Different from baseline Orientation Level: Oriented to person Cognition Arousal/Alertness: Awake/alert Behavior During Therapy: Flat affect Overall Cognitive Status: Impaired/Different from baseline Area of Impairment: Orientation, Attention, Memory, Following commands, Problem solving Orientation Level: Disoriented to, Situation, Place (Did not ask about time) Current Attention Level: Focused Memory: Decreased short-term memory Following Commands: Follows one step commands inconsistently, Follows one step commands with increased time Problem Solving: Slow processing, Decreased initiation, Difficulty sequencing, Requires verbal cues, Requires tactile cues General Comments: keeping eyes open for longer periods of time today.  answering most questions today but with significant delay and often having the question repeated.  initiation noted with simple grooming task but with perserveration and min/mod physical and verbal guidance to progress the task or  action.  Physical Exam: Blood pressure 135/65, pulse 71, temperature 97.9 F (36.6 C), temperature source Oral, resp. rate 18, height 5' 7.5" (1.715 m), weight 77.1 kg, SpO2 100 %. Physical Exam Vitals reviewed.  Constitutional:      General: He is not in acute distress. HENT:     Head:     Comments: Staples intact to sites, bilaterally, after recent DBS stimulator placement    Right Ear: External ear normal.     Left Ear: External ear normal.     Nose: Nose normal.  Eyes:     General:        Right eye: No discharge.        Left eye: No discharge.     Extraocular Movements: Extraocular movements intact.  Cardiovascular:     Rate and Rhythm: Normal rate.  Pulmonary:     Effort: Pulmonary effort is normal. No respiratory distress.     Breath sounds: No stridor.  Abdominal:     General: Abdomen is flat. There is no distension.  Musculoskeletal:     Cervical back: Normal range of motion and neck supple.     Comments: No edema or tenderness in extremities  Skin:    Comments: Scalp with staples bilateral CDI  Neurological:     Mental Status: He is alert.     Comments: Alert and oriented x1  Makes eye contact with examiner. Follows simple commands.   Mild dysarthria  He was able to provide his name and his wife but cannot provide appropriate date.  Some delay in auditory processing.   He does have a resting tremor with rigidity to all 4 limbs. Motor: Grossly 5/5 throughout  Psychiatric:     Comments: Limited due to mentation.     Lab Results Last 48 Hours        Results for orders placed or performed during the hospital encounter of 06/16/20 (from the past 48 hour(s))  Basic metabolic panel     Status: Abnormal   Collection Time: 06/20/20 11:10 AM  Result Value Ref Range   Sodium 135 135 - 145 mmol/L   Potassium 4.2 3.5 - 5.1 mmol/L   Chloride 98 98 - 111 mmol/L   CO2 26 22 - 32 mmol/L   Glucose, Bld 143 (H) 70 - 99 mg/dL    Comment: Glucose reference  range applies only to samples taken after fasting for at least 8 hours.   BUN 17 8 - 23 mg/dL   Creatinine, Ser 1.610.67 0.61 - 1.24 mg/dL   Calcium 9.3 8.9 - 09.610.3 mg/dL   GFR calc non Af Amer >60 >60 mL/min   GFR calc Af Amer >60 >60 mL/min   Anion gap 11 5 - 15    Comment: Performed at [facility], [address] [place], [place] [id number]      Imaging Results (Last 48 hours)  CT HEAD WO CONTRAST  Result Date: 06/19/2020 CLINICAL DATA:  Follow-up neural stimulator leads and associated bifrontal edema. EXAM: CT HEAD WITHOUT CONTRAST TECHNIQUE: Contiguous axial images were obtained from the base of the skull through the vertex without intravenous contrast. COMPARISON:  06/16/2020 FINDINGS: Brain: Stable bifrontal neural stimulator leads extending into the upper midbrain bilaterally. Low density in the midbrain and pons bilaterally is unchanged. White matter low density surrounding the leads in both cerebral hemispheres is slightly less prominent. No associated mass effect. No intracranial hemorrhage. Ill-defined low density in the right occipital lobe without significant change. Vascular: No hyperdense vessel or unexpected calcification. Skull: Stable bifrontal burr holes. Sinuses/Orbits: Unremarkable. Other: None. IMPRESSION: 1. Stable bifrontal neural stimulator leads extending into the upper midbrain bilaterally. 2. White matter low density surrounding the leads in both cerebral hemispheres is slightly less prominent. 3. Stable ill-defined low density in the midbrain and pons bilaterally, possibly representing ischemic changes. 4. Stable ill-defined low density in the right occipital lobe, possibly representing an area of subacute infarction. Electronically Signed   By: Beckie SaltsSteven  Reid M.D.   On: 06/19/2020 19:01        Medical Problem List and Plan: 1.  Decreased functional mobility secondary to Parkinson's disease recent [facility] with  postoperative complications/delirium             -patient may not shower             -ELOS/Goals: 6-9 days/Mod I/supervision             Admit to CIR 2.  Antithrombotics: SCDs -DVT/anticoagulation:               -antiplatelet therapy: Aspirin 81 mg daily 3. Pain Management/chronic back pain: Currently on home regimen of oxycodone 15 mg 4 times daily, Flexeril 10 mg 3 times daily as needed.  Monitor mental status             Monitor with increased  exertion. 4. Mood: Ambien as prior to admission decreased to 5 mg nightly             -antipsychotic agents: Seroquel 12.5 mg nightly as needed 5. Neuropsych: This patient is not capable of making decisions on his own behalf. 6. Skin/Wound Care: Routine skin checks 7. Fluids/Electrolytes/Nutrition: Routine in and outs.  CMP ordered for tomorrow a.m. 8.  Parkinson's disease.  Continue Sinemet 25-100 mg 4 times daily as well as Artane 2 mg 3 times daily 9.  Hypertension.  Lisinopril 5 mg daily.                      Monitor with increased mobility. 10.  B12 deficiency.  Complete 7-day  course of vitamin B12 1000 mcg daily x7 days 11.  Hypothyroidism.  Continue Synthroid  Mcarthur Rossetti Angiulli, PA-C 06/21/2020  I have personally performed a face to face diagnostic evaluation, including, but not limited to relevant history and physical exam findings, of this patient and developed relevant assessment and plan.  Additionally, I have reviewed and concur with the physician assistant's documentation above.  Maryla Morrow, MD, ABPMR  The patient's status has not changed. The original post admission physician evaluation remains appropriate, and any changes from the pre-admission screening or documentation from the acute chart are noted above.   Maryla Morrow, MD, ABPMR

## 2020-06-21 NOTE — Progress Notes (Signed)
Marcello Fennel, MD  Physician  Physical Medicine and Rehabilitation  PMR Pre-admission     Addendum  Date of Service:  06/21/2020  1:44 PM      Related encounter: ED to Hosp-Admission (Current) from 06/16/2020 in [facility]       Show:Clear all Manual[x] Template[x] Copied  Added by: Standley Brooking, RN[x] Marcello Fennel, MD  Hover for details PMR Admission Coordinator Pre-Admission Assessment   Patient: Reginald Jenkins is an 74 y.o., male MRN: [id number] DOB: 01-03-1946 Height: 5' 7.5" (171.5 cm) Weight: 77.1 kg                                                                                                                                                  Insurance Information HMO:     PPO:      PCP:      IPA:      80/20:      OTHER:  PRIMARY: Medicare a and b      Policy#: [id number]      Subscriber: pt Benefits:  Phone #: passport one online     Name: 06/20/2020 Eff. Date: 05/11/2006     Deduct: $1484      Out of Pocket Max: none      Life Max: none  CIR: 100%      SNF: 20 full days Outpatient: 80%     Co-Pay: 20% Home Health: 100%      Co-Pay: none DME: 80%     Co-Pay: 20% Providers: pt choice  SECOND[facility]: [facility]      Policy#: [id number]      Phone#: pt   Financial Counselor:       Phone#:    The "Data Collection Information Summary" for patients in Inpatient Rehabilitation Facilities with attached "Privacy Act Statement-Health Care Records" was provided and verbally reviewed with: Patient and Family   Emergency Contact Information Contact Information       Name Relation Home Work Mobile    Perle, Gibbon Spouse [phone]   [phone]         Current Medical History  Patient Admitting Diagnosis: Parkinson's disease with recent DBS placement   History of Present Illness:HPI: Reginald Jenkins is a 74 year old right-handed male history of Parkinson's disease maintained on Sinemet as well as Artane followed by neurology Dr. Myer Peer in [place]  [place] status post brain stimul[facility]/06/2020, hypertension and hypothyroidism as well as chronic back pain maintained on oxycodone 15 mg 4 times daily followed by Dr. Erskin Burnet in [place].  Reportedly recently independent with steady decline in functional mobility as well as cognitive deficits.  1 level home with ramped entrance.  Cranial CT scan showed bilateral stimulator device seen extending from the previously described bifrontal craniotomies with tips in the expected position of the  substantia nigra bilaterally.  Mild low density seen involving the subcortical white matter around the stimulator leads in both frontal lobes most consistent with postoperative edema.  No hemorrhage or definite acute infarct noted.  Admission chemistry sodium 133 glucose 122 hemoglobin 12.4, TSH 0.164 and free T4 1.85, urinalysis negative nitrite, sedimentation rate 39, ammonia level 18, vitamin B12 69.  Neurology service as well as neurosurgery consulted with follow-up cranial CT scan 06/19/2020 stable bifrontal neurostimulator leads white matter low density surrounding leads in both cerebral hemispheres slightly less prominent than prior tracing.  Stable ill-defined low-density in the midbrain and pons bilaterally possibly representing ischemic changes.  Stable ill-defined low-density right occipital lobe possibly representing area of a subacute infarction and recommendations remained to continue to monitor and did remain on low-dose aspirin.  MRI on 06/21/2020 results pending as discussed with [employer] representative for DBS device for possible need for reprogramming.  [name] delirium agitation has improved and his Seroquel was discontinued.  He was on low-dose Ambien as prior to admission reduced to 5 mg.  No current plan for EEG.  Patient was placed on B12 1000 mcg intramuscularly daily x7 days.     Complete NIHSS TOTAL: 4 Glasgow Coma Scale Score: 14   Past  Medical History      Past Medical History:  Diagnosis Date  . Anemia      borderline  . 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 Arthritis    . Broken collarbone      as a child  . Dyspnea      when climbing stairs   . GERD (gastroesophageal reflux disease)    . Hypertension    . Hypothyroidism    . Parkinson disease (HCC) 2010      Family History  family history is not on file.   Prior Rehab/Hospitalizations:  Has the patient had prior rehab or hospitalizations prior to admission? Yes   Has the patient had major surgery during 100 days prior to admission? Yes   Current Medications    Current Facility-Administered Medications:  .  acetaminophen (TYLENOL) tablet 500 mg, 500 mg, Oral, Q6H PRN, Hillary Bow, DO, 500 mg at 06/20/20 0109 .  aspirin chewable tablet 81 mg, 81 mg, Oral, Daily, Spongberg, Susy Frizzle, MD, 81 mg at 06/21/20 0859 .  carbidopa-levodopa (SINEMET IR) 25-100 MG per tablet immediate release 1 tablet, 1 tablet, Oral, QID, Hillary Bow, DO, 1 tablet at 06/21/20 1338 .  chlorhexidine (PERIDEX) 0.12 % solution 15 mL, 15 mL, Mouth/Throat, BID, Julian Reil, Jared M, DO, 15 mL at 06/21/20 0857 .  cyanocobalamin ((VITAMIN B-12)) injection 1,000 mcg, 1,000 mcg, Intramuscular, Daily, Bhagat, Srishti L, MD, 1,000 mcg at 06/21/20 0857 .  cyclobenzaprine (FLEXERIL) tablet 10 mg, 10 mg, Oral, TID PRN, Hillary Bow, DO, 10 mg at 06/20/20 2056 .  docusate (COLACE) 50 MG/5ML liquid 100 mg, 100 mg, Oral, TID, Spongberg, Susy Frizzle, MD, 100 mg at 06/21/20 0856 .  feeding supplement (ENSURE ENLIVE) (ENSURE ENLIVE) liquid 237 mL, 237 mL, Oral, BID BM, Lyda Perone M, DO, 237 mL at 06/21/20 0858 .  ferrous sulfate 300 (60 Fe) MG/5ML syrup 300 mg, 300 mg, Oral, BID WC, Spongberg, Susy Frizzle, MD, 300 mg at 06/21/20 0857 .  levothyroxine (SYNTHROID) tablet 125 mcg, 125 mcg, Oral, QAC breakfast, Meredeth Ide, MD, 125 mcg at 06/21/20 0532 .  lisinopril (ZESTRIL)  tablet 5 mg, 5 mg, Oral, QPM, Julian Reil, Jared M, DO, 5  mg at 06/20/20 1815 .  multivitamin liquid 15 mL, 15 mL, Oral, Daily, Spongberg, Susy Frizzle, MD, 15 mL at 06/21/20 0857 .  olopatadine (PATANOL) 0.1 % ophthalmic solution 1 drop, 1 drop, Both Eyes, BID PRN, Julian Reil, Jared M, DO .  ondansetron (ZOFRAN) tablet 4 mg, 4 mg, Oral, Q6H PRN **OR** ondansetron (ZOFRAN) injection 4 mg, 4 mg, Intravenous, Q6H PRN, Hillary Bow, DO .  oxyCODONE (Oxy IR/ROXICODONE) immediate release tablet 15 mg, 15 mg, Oral, QID, Julian Reil, Jared M, DO, 15 mg at 06/20/20 2056 .  polyethylene glycol (MIRALAX / GLYCOLAX) packet 17 g, 17 g, Oral, Daily PRN, Marzetta Board, MD, 17 g at 06/20/20 2057 .  QUEtiapine (SEROQUEL) tablet 12.5 mg, 12.5 mg, Oral, QHS PRN, Bhagat, Srishti L, MD, 12.5 mg at 06/20/20 2056 .  trihexyphenidyl (ARTANE) tablet 2 mg, 2 mg, Oral, TID WC, Gardner, Jared M, DO, 2 mg at 06/21/20 1338 .  zolpidem (AMBIEN) tablet 7.5 mg, 7.5 mg, Oral, QHS PRN, Bhagat, Srishti L, MD, 7.5 mg at 06/20/20 2250   Patients Current Diet:  Diet Order                  Diet Heart Room service appropriate? Yes; Fluid consistency: Thin  Diet effective now                         Precautions / Restrictions Precautions Precautions: Fall Precaution Comments: wife denies fall history  Restrictions Weight Bearing Restrictions: No    Has the patient had 2 or more falls or a fall with injury in the past year?No   Prior Activity Level Community (5-7x/wk): Indpendent without AD before placement of DBS   Prior Functional Level Prior Function Level of Independence: Independent Gait / Transfers Assistance Needed: Not using an AD PTA   Self Care: Did the patient need help bathing, dressing, using the toilet or eating?  Independent   Indoor Mobility: Did the patient need assistance with walking from room to room (with or without device)? Independent   Stairs: Did the patient need assistance with  internal or external stairs (with or without device)? Independent   Functional Cognition: Did the patient need help planning regular tasks such as shopping or remembering to take medications? Independent   Home Assistive Devices / Equipment Home Assistive Devices/Equipment: None Home Equipment: Shower seat - built in Engineering geologist. User may not have seen previous data.)   Prior Device Use: Indicate devices/aids used by the patient prior to current illness, exacerbation or injury? None of the above   Current Functional Level Cognition   Overall Cognitive Status: Impaired/Different from baseline Current Attention Level: Focused Orientation Level: Oriented to person Following Commands: Follows one step commands inconsistently, Follows one step commands with increased time General Comments: keeping eyes open for longer periods of time today.  answering most questions today but with significant delay and often having the question repeated.  initiation noted with simple grooming task but with perserveration and min/mod physical and verbal guidance to progress the task or action.    Extremity Assessment (includes Sensation/Coordination)   Upper Extremity Assessment: RUE deficits/detail, LUE deficits/detail RUE Deficits / Details: tremor noted which wife reports is worse than baseline.  He demonstrates full AROM   RUE Coordination: decreased fine motor, decreased gross motor LUE Deficits / Details: full AROM noted.  Significant motor planning deficits noted  LUE Coordination: decreased gross motor, decreased fine motor  Lower Extremity Assessment: Defer to PT  evaluation     ADLs   Overall ADL's : Needs assistance/impaired Eating/Feeding: Total assistance, Sitting Grooming: Wash/dry hands, Moderate assistance, Standing Grooming Details (indicate cue type and reason): able to initiate reaching for soap, unable to determine if he wanted 3x of soap or unable to transition to next step of  hand washing. physical assistance required to move L hand and bring to R hand to begin hand washing.  able to lather and move hands together in washing motion.  assistance for drying.  offered face washing and oral care pt. replied "no" Upper Body Bathing: Total assistance, Sitting Lower Body Bathing: Total assistance, Sit to/from stand Upper Body Dressing : Total assistance, Sitting Lower Body Dressing: Total assistance, Sit to/from stand Toilet Transfer: Min guard, Minimal assistance, Cueing for sequencing, Stand-pivot, Regular Toilet, BSC, Grab bars Toilet Transfer Details (indicate cue type and reason): intermittent furniture walking utilized from sink and grab bars while entering the b.room.  max cues to take backwards steps to the toilet before sitting down. Toileting- Clothing Manipulation and Hygiene: Moderate assistance, Sit to/from stand Toileting - Clothing Manipulation Details (indicate cue type and reason): reports his wife usually assists with undergarments but used R hand to assist with pulling up right side of brief after urinating Functional mobility during ADLs: Minimal assistance, +2 for safety/equipment, +2 for physical assistance General ADL Comments: assisting with toileting and grooming tasks. noted improvement from yesterdays session     Mobility   Overal bed mobility: Needs Assistance Bed Mobility: Sit to Supine Supine to sit: Min assist Sit to supine: Supervision General bed mobility comments: Pt sitting up in chair with wife present at the time of PT arrival. Pt returned to supine with supervision and assist from therapist to manage linen.      Transfers   Overall transfer level: Needs assistance Equipment used: None Transfers: Sit to/from Stand Sit to Stand: Min guard Stand pivot transfers: Min assist General transfer comment: Pt was able to power up to full stand without assistance. Close hands on guard provided for safety but no unsteadiness noted.       Ambulation / Gait / Stairs / Wheelchair Mobility   Ambulation/Gait Ambulation/Gait assistance: Editor, commissioning (Feet): 200 Feet Assistive device: 1 person hand held assist Gait Pattern/deviations: Shuffle, Festinating, Trunk flexed, Narrow base of support General Gait Details: Pt maintaining long step/stride length with minimal cues, however does get somewhat stuck when negotiating in tighter areas (bathroom, turns at EOB). Assist for direction and to lead pt away frrom an instance where he may be "stuck".  Gait velocity: Decreased Gait velocity interpretation: <1.31 ft/sec, indicative of household ambulator     Posture / Balance Dynamic Sitting Balance Sitting balance - Comments: anterior lean/trunk flexion noted as pt sat in the chair for longer period.  Balance Overall balance assessment: Needs assistance Sitting-balance support: Feet supported, No upper extremity supported Sitting balance-Leahy Scale: Fair Sitting balance - Comments: anterior lean/trunk flexion noted as pt sat in the chair for longer period.  Standing balance support: No upper extremity supported, During functional activity Standing balance-Leahy Scale: Fair Standing balance comment: As pt fatigued, poor     Special needs/care consideration Daily visitor is wife, Fannie Knee and she has been given permission to stay overnight per Dr. Allena Katz due to patient safety needs Recent placement of DBS at [facility], [place] Fall risk    Previous Home Environment  Living Arrangements: Spouse/significant other  Lives With: Spouse Available Help at Discharge: Family, Available 24 hours/day  Type of Home: House Home Layout: One level Home Access: Ramped entrance (in back) Bathroom Shower/Tub: Health visitorWalk-in shower Bathroom Toilet: Handicapped height Bathroom Accessibility: Yes How Accessible: Accessible via walker Home Care Services: No Additional Comments: [place] and she is having  someone install grab bars in the bathroom    Discharge Living Setting Plans for Discharge Living Setting: [name] home, Lives with (comment) Type of Home at Discharge: House Discharge Home Layout: One level Discharge Home Access: Ramped entrance Discharge Bathroom Shower/Tub: Walk-in shower Discharge Bathroom Toilet: Handicapped height Does the patient have any problems obtaining your medications?: No   Social/Family/Support Systems Patient Roles: Spouse Contact Information: wife, Fannie KneeSue Anticipated Caregiver: wife Anticipated Industrial/product designerCaregiver's Contact Information: see above Ability/Limitations of Caregiver: none Caregiver Availability: 24/7 Discharge Plan Discussed with Primary Caregiver: Yes Is Caregiver In Agreement with Plan?: Yes Does Caregiver/Family have Issues with Lodging/Transportation while Pt is in Rehab?: No   Goals Patient/Family Goal for Rehab: Mod I/supervision with PT, OT, and SLP Expected length of stay: ELOS 7-11 days Pt/Family Agrees to Admission and willing to participate: Yes Program Orientation Provided & Reviewed with Pt/Caregiver Including Roles  & Responsibilities: Yes   Decrease burden of Care through IP rehab admission: n/a   Possible need for SNF placement upon discharge:not anticipated   Patient Condition: This patient's condition remains as documented in the consult dated 06/19/2020, in which the Rehabilitation Physician determined and documented that the patient's condition is appropriate for intensive rehabilitative care in an inpatient rehabilitation facility. Will admit to inpatient rehab today.   Preadmission Screen Completed By:  Clois DupesBoyette, Cong Hightower Godwin, RN, 06/21/2020 1:44 PM ______________________________________________________________________   Discussed status with Dr. Allena KatzPatel on 06/21/2020 at  1418 and received approval for admission today.   Admission Coordinator:  Clois DupesBoyette, Omeed Osuna Godwin, time [id number] Date 06/21/2020         Revision History                      Note Details  Author Allena KatzPatel, Maryln GottronAnkit Anil, MD File Time 06/21/2020  2:21 PM  Author Type Physician Status Addendum  Last Editor Marcello FennelPatel, Ankit Anil, MD Service Physical Medicine and Rehabilitation

## 2020-06-21 NOTE — TOC Transition Note (Signed)
Transition of Care [facility]) - CM/SW Discharge Note   Patient Details  Name: Reginald Jenkins MRN: [id number] Date of Birth: [birth year]  [facility] CM/SW Contact:  Kermit Balo, RN Phone Number: 06/21/2020, 1:55 PM   Clinical Narrative:    Pt discharging to CIR today. CM signing off.   Final next level of care: IP Rehab Facility Barriers to Discharge: No Barriers Identified   Patient Goals and CMS Choice        Discharge Placement                       Discharge Plan and Services                                     Social Determinants of Health (SDOH) Interventions     Readmission Risk Interventions No flowsheet data found.

## 2020-06-21 NOTE — Progress Notes (Signed)
   Providing Compassionate, Quality Care - Together  NEUROSURGERY PROGRESS NOTE   S: No issues overnight.   O: EXAM:  BP 117/65 (BP Location: Left Arm)   Pulse 75   Temp 98.8 F (37.1 C) (Oral)   Resp 18   Ht 5' 7.5" (1.715 m)   Wt 77.1 kg   SpO2 100%   BMI 26.23 kg/m   Awake, alert, oriented  Slow speaking MAE equally Incisions well healing, no erythema No drift  ASSESSMENT:  74 y.o. male with  1.  Parkinson's disease -Status post bilateral STN DBS 7-27 and at outside facility 2.  Encephalopathy, unknown etiology  Plan:  -Recommend MRI of the brain with and without contrast as planned -repeat CT shows edema is decreasing, if no infection on MRI could try steroids -if MRI is concerning for infection, I counseled the wife that its best for continuity of care to return to her surgeon in [place] -no nsx intervention at this time, will signoff, please call with questions  Thank you for allowing me to participate in this patient's care.  Please do not hesitate to call with questions or concerns.   Monia Pouch, DO Neurosurgeon [facility] Cell: [phone]

## 2020-06-21 NOTE — Progress Notes (Signed)
Patient ID: Reginald Jenkins, male   DOB: 1946-09-10, 74 y.o.   MRN: [id number]  Met with patient and wife. Introduced myself, explained my role, explained rehab process, and discharge process. Wife stated that some DME had been ordered through the [place], but had not arrived as of yet. I explained that SW and myself could look into it, but we would also wait on recommendations from the rehab team. I explained that pt still had the same medication regimen as the prior unit, and we would continue with that unless the MD made changes. All questions asked and answered with verbal understanding.  Dorthula Nettles, RN, BSN, [place] Office [phone] Cell [phone]

## 2020-06-21 NOTE — Progress Notes (Signed)
Inpatient Rehabilitation Medication Review by a Pharmacist  A complete drug regimen review was completed for this patient to identify any potential clinically significant medication issues.  Clinically significant medication issues were identified:  no  Check AMION for pharmacist assigned to patient if future medication questions/issues arise during this admission.  Pharmacist comments:   Time spent performing this drug regimen review (minutes):    Nadara Mustard, PharmD., MS Clinical Pharmacist Thank you for allowing pharmacy to be part of this patients care team.  06/21/2020 5:20 PM

## 2020-06-21 NOTE — Progress Notes (Addendum)
NEUROLOGY PROGRESS NOTE   Brief history: Reginald Jenkins is a 74 y.o. 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  CT head was performed which showed bilateral DBS electrodes placed in the substantia nigra with surrounding edema in bilateral frontal lobes around the electrodes.  Subjective/Interval history: [name] mental status is stable from yesterday. Wife feels like his ability to move is improved in the setting of not getting Seroquel last night, but she tells me that he did not sleep at all overnight and was agitated and angry at her which is very atypical for him.  She also wanted me to know that his cognitive evaluation prior to DBS did not show any evidence of dementia.  The patient tries to express some thoughts but his grammar remains difficult for [place].  Exam: Vitals:   06/21/20 1650  BP: 116/68  Pulse: 67  Resp: 18  SpO2: 99%    Physical Exam  Constitutional: Appears well-developed and well-nourished.  Psych: Improving ability to follow conversation, affect appropriate though does have masked facies, smiles at times Eyes: No scleral injection HENT: No OP obstrucion Head: Normocephalic. Staples in place on frontal scalp, healing well Cardiovascular: Perfusing extremities well Respiratory: Effort normal, non-labored breathing Abdomen: non-distended Extremities: well perfused, no significant edema Skin: DBS pulse generator in place below R clavicle  Neuro:  Mental Status: Able to follow more complex commands, able to name and repeat oriented to self, [facility] hospital, but not year or month Cranial Nerves: II: Grossly intact III,IV, VI: External ocular muscles intact VII: Masked facies, but symmetric smile VIII: hearing normal bilaterally  Motor: Tone and bulk: Mild  cogwheeling tremor and increased tone R hand tremor at rest.  Decrementing finger tapping    Medications: I have reviewed the patient's current medications, he has been placed on a reduced dose of Ambien and continues on his home oxycodone.  Pertinent Labs/Diagnostics:  Mild hyponatremia (133) Low TSH (0.164), free T4 1.85 (elevated) B1 pending B12 low at 69 Ammonia resulted normal at 18  Follow-up head CT on 8/9 was personally reviewed by myself and demonstrated resolving peri-DBS-lead edema as well as some additional hypodensities of unclear chronicity   CT HEAD WO CONTRAST  Result Date: 06/19/2020 CLINICAL DATA:  Follow-up neural stimulator leads and associated bifrontal edema. EXAM: CT HEAD WITHOUT CONTRAST TECHNIQUE: Contiguous axial images were obtained from the base of the skull through the vertex without intravenous contrast. COMPARISON:  06/16/2020 FINDINGS: Brain: Stable bifrontal neural stimulator leads extending into the upper midbrain bilaterally. Low density in the midbrain and pons bilaterally is unchanged. White matter low density surrounding the leads in both cerebral hemispheres is slightly less prominent. No associated mass effect. No intracranial hemorrhage. Ill-defined low density in the right occipital lobe without significant change. Vascular: No hyperdense vessel or unexpected calcification. Skull: Stable bifrontal burr holes. Sinuses/Orbits: Unremarkable. Other: None. IMPRESSION: 1. Stable bifrontal neural stimulator leads extending into the upper midbrain bilaterally. 2. White matter low density surrounding the leads in both cerebral hemispheres is slightly less prominent. 3. Stable ill-defined low density in the midbrain and pons bilaterally, possibly representing ischemic changes. 4. Stable ill-defined low density in the right occipital lobe, possibly representing an area of subacute infarction. Electronically Signed   By: Beckie Salts M.D.   On: 06/19/2020 19:01   MR  ANGIO HEAD WO CONTRAST  Result Date: 06/21/2020 CLINICAL DATA:  Parkinson's. Deep brain stimulator placement 06/06/2020 in [place]. Postprocedural confusion and irritable behavior. EXAM: MRI HEAD WITHOUT AND WITH CONTRAST MRA HEAD WITHOUT CONTRAST TECHNIQUE: Multiplanar, multiecho pulse sequences of the brain and surrounding structures were obtained without and with intravenous contrast. Angiographic images of the head were obtained using MRA technique without contrast. CONTRAST:  74mL GADAVIST GADOBUTROL 1 MMOL/ML IV SOLN COMPARISON:  CT head 06/19/2020 FINDINGS: MRI HEAD FINDINGS Brain: Bilateral deep brain stimulators extend through the thalamus and into the midbrain bilaterally. There is moderate edema surrounding both leads similar to that seen on the recent CT. No associated restricted diffusion or hemorrhage in the edema. Following contrast infusion, no abnormal enhancement is seen surrounding the leads to suggest infection. Ventricle size normal. Generalized atrophy. Negative for acute infarct. No mass lesion. There are small posterior subdural hematomas bilaterally in the occipital region. These contain methemoglobin suggesting subacute duration and measure approximately 3 mm each. There is also a small amount of blood extending along the falx posteriorly. Vascular: Normal arterial flow voids Skull and upper cervical spine: No focal skeletal lesion. Sinuses/Orbits: Mild mucosal edema paranasal sinuses. Negative orbit Other: None MRA HEAD FINDINGS Right vertebral artery dominant. Both vertebral arteries contribute to the basilar but mostly on the right. PICA patent bilaterally. Basilar patent. AICA, superior cerebellar, posterior cerebral arteries patent bilaterally fetal origin left posterior cerebral artery. Decreased signal in the right posterior cerebral artery is most likely related to artifact. Internal carotid artery widely patent bilaterally. Anterior and middle cerebral arteries patent  bilaterally. Decreased signal in the middle cerebral arteries bilaterally due to artifact. Negative for cerebral aneurysm. IMPRESSION: 1. Edema surrounding the deep brain stimulator leads bilaterally. No restricted diffusion, hemorrhage, or enhancement to suggest infection around the leads. 2. Small occipital subdural hematoma bilaterally which appears subacute with methemoglobin. 3. Negative for acute infarct. 4. Negative MRA Electronically Signed   By: Marlan Palau M.D.   On: 06/21/2020 15:04   MR BRAIN W WO CONTRAST  Result Date: 06/21/2020 CLINICAL DATA:  Parkinson's. Deep brain stimulator placement 06/06/2020 in [place]. Postprocedural confusion and irritable behavior. EXAM: MRI HEAD WITHOUT AND WITH CONTRAST MRA HEAD WITHOUT CONTRAST TECHNIQUE: Multiplanar, multiecho pulse sequences of the brain and surrounding structures were obtained without and with intravenous contrast. Angiographic images of the head were obtained using MRA technique without contrast. CONTRAST:  55mL GADAVIST GADOBUTROL 1 MMOL/ML IV SOLN COMPARISON:  CT head 06/19/2020 FINDINGS: MRI HEAD FINDINGS Brain: Bilateral deep brain stimulators extend through the thalamus and into the midbrain bilaterally. There is moderate edema surrounding both leads similar to that seen on the recent CT. No associated restricted diffusion or hemorrhage in the edema. Following contrast infusion, no abnormal enhancement is seen surrounding the leads to suggest infection. Ventricle size normal. Generalized atrophy. Negative for acute infarct. No mass lesion. There are small posterior subdural hematomas bilaterally in the occipital region. These contain methemoglobin suggesting subacute duration and measure approximately 3 mm each. There is also a small amount of blood extending along the falx posteriorly. Vascular: Normal arterial flow voids Skull and upper cervical spine: No focal skeletal lesion. Sinuses/Orbits: Mild mucosal edema paranasal  sinuses. Negative orbit Other: None MRA HEAD FINDINGS Right vertebral artery dominant. Both vertebral arteries contribute to the basilar but mostly on the right. PICA patent bilaterally. Basilar patent. AICA, superior cerebellar, posterior cerebral arteries patent bilaterally fetal origin left posterior cerebral artery. Decreased signal in the  right posterior cerebral artery is most likely related to artifact. Internal carotid artery widely patent bilaterally. Anterior and middle cerebral arteries patent bilaterally. Decreased signal in the middle cerebral arteries bilaterally due to artifact. Negative for cerebral aneurysm. IMPRESSION: 1. Edema surrounding the deep brain stimulator leads bilaterally. No restricted diffusion, hemorrhage, or enhancement to suggest infection around the leads. 2. Small occipital subdural hematoma bilaterally which appears subacute with methemoglobin. 3. Negative for acute infarct. 4. Negative MRA Electronically Signed   By: Marlan Palau M.D.   On: 06/21/2020 15:04   Impression: 74 year old male with past medical history of Parkinson's disease without dementia, status post bilateral deep brain stimulator to sepsis nigra presents with 1 week of confusion and agitation post surgery.   Work-up has revealed some metabolic issues which we have been addressing (reducing levothyroxine dose, starting B12 supplementation), the patient's mental status has been gradually improving.  Notably now his vitamin E has also resulted low, which I have encouraged his wife to discuss with his primary care physician.   MRI reveals that in addition to resolving perilead edema, the patient has resolving occipital subdural hematomas which were likely contributing to his altered mental status.  Expect continued gradual improvement.  Overall my impression is that this is a multifactorial altered mental status from recent surgery, subdural hematomas, and vitamin deficiencies  Recommendations: #  hyponatremia, resolved  # hypothyroidism, stable - TSH low at 0.164 and Free t4 elevated - levothyroxine adjustment (reduced from 150 to 125 per primary team)  # B12 deficiency, resolving - B12 1000 mcg IM daily x 7 days then 1000 mcg oral daily - IF antibodies 1.1 -- normal  - appreciate medicine recommendations for any further workup / monitoring   # cerebral edema, resolving # subdural hematomas, resolving - appreciate neurosurgery recommendations  # Further Delirium / Encephalopathy workup/management -made Seroquel PRN 12.5 mg tonight Additionally, please follow these delirium precautions:   - try to minimize deliriogenic medications as much as possible (J Am Geriatr Soc. 2012 Apr;60(4):616-31): benzodiazepines, anticholinergics, diphenhydramine, antihistamines, narcotics, Ambien/Lunesta/Sonata etc.  - home Ambien dose is 12.5, reduced to 5 mg initially, increased to 7.5 mg 8/10 with hopes of weaning Seroquel   - continues on home oxy 15 mg QID scheduled, if he worsens, would reduce this next - Continue environmental support for delirium: Lights on during the day, patient up and out of bed as much as is feasible, OT/PT, quiet dimly lit room at night, reorient patient often, provide hearing aides and glasses if patient uses them routinely, minimize sleep disruptions as much as possible overnight.  As much as possible, reorient patient, and have them engage patient in activities, e.g. playing cards. TV should be off or on neutral background music unless patient engaged and watching. Try to keep interactions with the patient calm and quiet.    - Long-term recommend weaning of the Seroquel in discussion with his primary care physician and pain management physician  06/21/2020, 5:46 PM  Brooke Dare MD-PhD [facility] [phone]   If 7pm- 7am, please page neurology on call as listed in AMION.

## 2020-06-21 NOTE — Progress Notes (Signed)
Reginald Oyster, MD  Physician  Physical Medicine and Rehabilitation  Consult Note     Signed  Date of Service:  06/19/2020  6:19 AM      Related encounter: ED to Hosp-Admission (Current) from 06/16/2020 in [place] [facility]      Signed      Expand All Collapse All  Show:Clear all [x] Manual[x] Template[] Copied  Added by: [x] Angiulli, , PA-C[x] , MD  [] Hover for details          Physical Medicine and Rehabilitation Consult Reason for Consult: Decreased functional mobility with delirium Referring Physician: [facility]     HPI: Reginald Jenkins is a 74 y.o. right-handed male with history of Parkinson's disease maintained on Sinemet status post brain stimulator placement at [facility] on 06/06/2020, hypertension, hypothyroidism.  Per chart review patient lives with spouse.  Reportedly recently independent with steady decline and functional mobility as well as cognitive deficits.  1 level home ramped entrance.  Cranial CT scan showed bilateral stimulator device is seen extending from the previously described bifrontal craniotomies with tips in the expected position of the substantia nigra bilaterally.  Mild low density seen involving the subcortical white matter around the stimulator leads in both frontal lobes most consistent with postoperative edema.  No hemorrhage or definite acute infarct noted.  Admission chemistry sodium 133, glucose 122, hemoglobin 12.4, urinalysis negative nitrite, sedimentation rate 39, ammonia level 18, vitamin B12 69.  Neurology as well as neurosurgery consulted with MRI of the brain pending.  Awaiting plan for possible EEG.  Tolerating a regular diet.  Therapy evaluations completed with recommendations of physical medicine rehab consult.     Review of Systems  Constitutional: Negative for chills and fever.  HENT: Negative for hearing loss.   Eyes: Negative for blurred vision and double vision.  Respiratory: Negative  for cough.        He does have some dyspnea when climbing stairs.  Cardiovascular: Negative for chest pain, palpitations and leg swelling.  Gastrointestinal: Positive for constipation. Negative for heartburn and nausea.       GERD  Genitourinary: Negative for dysuria, flank pain and hematuria.  Musculoskeletal: Positive for myalgias.  Skin: Negative for rash.  Psychiatric/Behavioral: Positive for memory loss. The patient has insomnia.        Agitation and restlessness as well as anxiety  All other systems reviewed and are negative.       Past Medical History:  Diagnosis Date  . Anemia      borderline  . 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 Arthritis    . Broken collarbone      as a child  . Dyspnea      when climbing stairs   . GERD (gastroesophageal reflux disease)    . Hypertension    . Hypothyroidism    . Parkinson disease (HCC) 2010         Past Surgical History:  Procedure Laterality Date  . APPENDECTOMY      . arm surgery Left 1967    a rod was put in then 8 months it was removed  . CHOLECYSTECTOMY      . LAMINECTOMY   1986  . ROTATOR CUFF REPAIR Right 2001  . 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    Procedure: LEFT TOTAL SHOULDER ARTHROPLASTY;  Surgeon: 2011, MD;  Location: [facility] OR;  Service: Orthopedics;  Laterality: Left;    No family history on file. Social  History:  reports that he quit smoking about 53 years ago. His smoking use included cigarettes. He has a 1.50 pack-year smoking history. He quit smokeless tobacco use about 31 years ago.  His smokeless tobacco use included chew. He reports that he does not drink alcohol and does not use drugs. Allergies: No Known Allergies       Medications Prior to Admission  Medication Sig Dispense Refill  . acetaminophen (TYLENOL) 500 MG tablet Take 500 mg by mouth every 6 (six) hours as needed (for pain.).      Marland Kitchen aspirin EC 81 MG tablet Take 81 mg by mouth at bedtime.      . carbidopa-levodopa (SINEMET  IR) 25-100 MG tablet Take 1 tablet by mouth 4 (four) times daily.      . cyclobenzaprine (FLEXERIL) 10 MG tablet Take 10 mg by mouth 3 (three) times daily as needed for muscle spasms.      Marland Kitchen docusate sodium (COLACE) 100 MG capsule Take 100 mg by mouth 3 (three) times daily.      . ferrous sulfate 325 (65 FE) MG tablet Take 325 mg by mouth 2 (two) times daily.      Marland Kitchen levothyroxine (SYNTHROID, LEVOTHROID) 150 MCG tablet Take 150 mcg by mouth daily before breakfast.      . lisinopril (PRINIVIL,ZESTRIL) 5 MG tablet Take 5 mg by mouth every evening.      . Multiple Vitamin (MULTIVITAMIN WITH MINERALS) TABS tablet Take 1 tablet by mouth daily. Centrum      . olopatadine (PATANOL) 0.1 % ophthalmic solution Place 1 drop into both eyes 2 (two) times daily as needed for allergies.      Marland Kitchen oxyCODONE (ROXICODONE) 15 MG immediate release tablet Take 15 mg by mouth 4 (four) times daily.      . trihexyphenidyl (ARTANE) 2 MG tablet Take 2 mg by mouth 3 (three) times daily with meals.       Marland Kitchen zolpidem (AMBIEN CR) 12.5 MG CR tablet Take 12.5 mg by mouth at bedtime.          Home: Home Living Family/patient expects to be discharged to:: Private residence Living Arrangements: Spouse/significant other Available Help at Discharge: Family, Available 24 hours/day Type of Home: House Home Access: Ramped entrance (in back) Home Layout: One level Bathroom Shower/Tub: Health visitor: Handicapped height Home Equipment: Shower seat - built in Engineering geologist. User may not have seen previous data.) Additional Comments: wife reports that the [place] is getting them a shower seat and she is having someone install grab bars in the bathroom   Functional History: Prior Function Level of Independence: Independent Gait / Transfers Assistance Needed: Not using an AD PTA Functional Status:  Mobility: Bed Mobility Overal bed mobility: Needs Assistance Bed Mobility: Supine to Sit Supine to sit: Min  assist General bed mobility comments: Assist to initiate movement but pt able to transition to EOB largely without assist. To quicken the process assist provided for pt to pull himself fully out to EOB to get feet on the floor.  Transfers Overall transfer level: Needs assistance Equipment used: None Transfers: Sit to/from Stand, Stand Pivot Transfers Sit to Stand: Min guard Stand pivot transfers: Min assist General transfer comment: Pt was able to power up to full stand without assistance. Close hands on guard provided for safety but no unsteadiness noted.  Ambulation/Gait Ambulation/Gait assistance: Min assist Gait Distance (Feet): 200 Feet Assistive device: 1 person hand held assist Gait Pattern/deviations: Shuffle, Festinating, Trunk flexed, Narrow  base of support General Gait Details: VC's for long/big steps more frequent towards end of gait training due to fatigue. Pt initally ambulating fairly well with HHA - do not feel he needed it for balance however pt reaching out and trying to put hand around therapist's waist, so opted for HHA instead.  Gait velocity: Decreased Gait velocity interpretation: <1.31 ft/sec, indicative of household ambulator   ADL: ADL Overall ADL's : Needs assistance/impaired Eating/Feeding: Total assistance, Sitting Grooming: Wash/dry hands, Moderate assistance, Standing Grooming Details (indicate cue type and reason): able to initiate reaching for soap, unable to determine if he wanted 3x of soap or unable to transition to next step of hand washing. physical assistance required to move L hand and bring to R hand to begin hand washing.  able to lather and move hands together in washing motion.  assistance for drying.  offered face washing and oral care pt. replied "no" Upper Body Bathing: Total assistance, Sitting Lower Body Bathing: Total assistance, Sit to/from stand Upper Body Dressing : Total assistance, Sitting Lower Body Dressing: Total assistance, Sit  to/from stand Toilet Transfer: Min guard, Minimal assistance, Cueing for sequencing, Stand-pivot, Regular Toilet, BSC, Grab bars Toilet Transfer Details (indicate cue type and reason): intermittent furniture walking utilized from sink and grab bars while entering the b.room.  max cues to take backwards steps to the toilet before sitting down. Toileting- Clothing Manipulation and Hygiene: Moderate assistance, Sit to/from stand Toileting - Clothing Manipulation Details (indicate cue type and reason): reports his wife usually assists with undergarments but used R hand to assist with pulling up right side of brief after urinating Functional mobility during ADLs: Minimal assistance, +2 for safety/equipment, +2 for physical assistance General ADL Comments: assisting with toileting and grooming tasks. noted improvement from yesterdays session   Cognition: Cognition Overall Cognitive Status: Impaired/Different from baseline Orientation Level: Oriented to person Cognition Arousal/Alertness: Awake/alert Behavior During Therapy: Flat affect Overall Cognitive Status: Impaired/Different from baseline Area of Impairment: Orientation, Attention, Memory, Following commands, Problem solving Orientation Level: Disoriented to, Situation Current Attention Level: Focused Memory: Decreased short-term memory Following Commands: Follows one step commands inconsistently, Follows one step commands with increased time Problem Solving: Slow processing, Decreased initiation, Difficulty sequencing, Requires verbal cues, Requires tactile cues General Comments: keeping eyes open for longer periods of time today.  answering most questions today but with significant delay and often having the question repeated.  initiation noted with simple grooming task but with perserveration and min/mod physical and verbal guidance to progress the task or action.   Blood pressure 115/61, pulse (!) 59, temperature 97.7 F (36.5 C),  temperature source Oral, resp. rate 18, height 5' 7.5" (1.715 m), weight 77.1 kg, SpO2 100 %. Physical Exam HENT:     Head:     Comments: Crani incisions    Nose: Nose normal.  Eyes:     Pupils: Pupils are equal, round, and reactive to light.  Cardiovascular:     Rate and Rhythm: Normal rate.  Pulmonary:     Effort: Pulmonary effort is normal.  Musculoskeletal:     Cervical back: Normal range of motion.  Neurological:     Mental Status: He is alert.     Comments: Patient is alert.  Made eye contact with examiner.  Follows simple commands.    able to provide his name and that he had been married for 52 years.  He cannot provide the appropriate date for today. Said he was in [place], [place]. Recognized wife. Very delayed  with auditory processing. Resting tremor. Rigidity in all 4 limbs. Speech dysarthric. Moves all 4's. Senses pain.   Psychiatric:     Comments: Flat, cooperates        Lab Results Last 24 Hours       Results for orders placed or performed during the hospital encounter of 06/16/20 (from the past 24 hour(s))  Sedimentation rate     Status: Abnormal    Collection Time: 06/18/20 11:21 AM  Result Value Ref Range    Sed Rate 39 (H) 0 - 16 mm/hr  C-reactive protein     Status: Abnormal    Collection Time: 06/18/20 11:21 AM  Result Value Ref Range    CRP 1.7 (H) <1.0 mg/dL  T4, free     Status: Abnormal    Collection Time: 06/18/20 11:21 AM  Result Value Ref Range    Free T4 1.85 (H) 0.61 - 1.12 ng/dL  Vitamin Z61     Status: Abnormal    Collection Time: 06/18/20 11:21 AM  Result Value Ref Range    Vitamin B-12 69 (L) 180 - 914 pg/mL  Ammonia     Status: None    Collection Time: 06/18/20  3:43 PM  Result Value Ref Range    Ammonia 18 9 - 35 umol/L      Imaging Results (Last 48 hours)  No results found.       Assessment/Plan: Diagnosis: functional deficits related to Parkinson's disease, recent DBS placement and post-op complications. MRI, further work up  still pending.  1. Does the need for close, 24 hr/day medical supervision in concert with the patient's rehab needs make it unreasonable for this patient to be served in a less intensive setting? Yes 2. Co-Morbidities requiring supervision/potential complications: HTN, hypothyroid, anxiety 3. Due to bladder management, bowel management, safety, skin/wound care, disease management, medication administration, pain management and patient education, does the patient require 24 hr/day rehab nursing? Yes 4. Does the patient require coordinated care of a physician, rehab nurse, therapy disciplines of PT, OT, SLP to address physical and functional deficits in the context of the above medical diagnosis(es)? Yes Addressing deficits in the following areas: balance, endurance, locomotion, strength, transferring, bowel/bladder control, bathing, dressing, feeding, grooming, toileting, cognition, speech, swallowing and psychosocial support 5. Can the patient actively participate in an intensive therapy program of at least 3 hrs of therapy per day at least 5 days per week? Yes 6. The potential for patient to make measurable gains while on inpatient rehab is good 7. Anticipated functional outcomes upon discharge from inpatient rehab are supervision and min assist  with PT, supervision and min assist with OT, supervision and min assist/mod assistwith SLP. 8. Estimated rehab length of stay to reach the above functional goals is: 11-18 days 9. Anticipated discharge destination: Home 10. Overall Rehab/Functional Prognosis: good   RECOMMENDATIONS: This patient's condition is appropriate for continued rehabilitative care in the following setting: CIR Patient has agreed to participate in recommended program. N/A Note that insurance prior authorization may be required for reimbursement for recommended care.   Comment: Spoke to wife at length. Work up ongoing. Would benefit from the interdisciplinary approach of inpatient  rehab as it pertains to his ongoing neurological deficits and functional needs. Rehab Admissions Coordinator to follow up.   Thanks,   Reginald Oyster, MD, [place] [facility]   I have personally performed a face to face diagnostic evaluation of this patient. Additionally, I have examined pertinent labs and radiographic images. I have reviewed and  concur with the physician assistant's documentation above.     Charlton AmorDaniel J Angiulli, PA-C 06/19/2020        Revision History                     Routing History           Note Details  Author Reginald OysterSwartz, Zachary T, MD File Time 06/19/2020 12:37 PM  Author Type Physician Status Signed  Last Editor Reginald OysterSwartz, Zachary T, MD Service Physical Medicine and Rehabilitation

## 2020-06-21 NOTE — Discharge Summary (Addendum)
Physician Discharge Summary  Reginald Jenkins:[id number] DOB: [birth year] DOA: 06/16/2020  PCP: [name], NP  Admit date: 06/16/2020 Discharge date: 06/21/2020  Admitted From: home  Disposition:  CIR  Recommendations for Outpatient Follow-up and new medication changes:  1. Follow up with Reginald Shivers NP in 3 weeks.  2. Follow up on Thyroid Function testing in 6 weeks.    Home Health: na   Equipment/Devices: na    Discharge Condition: stable  CODE STATUS: full  Diet recommendation: heart healthy   Brief/Interim Summary: The patient admitted to the hospital with working diagnosis of metabolic encephalopathy complicated with delirium.  74 year old male with past medical history of Parkinson's disease who presented with confusion.  Patient had a brain stimulator placed at [facility] on 06/06/2020.  Postprocedure he developed confusion, hallucinations, lack of sleep and irritable behavior.  He was brought to the hospital due to persistent symptoms.  On his initial physical examination his blood pressure was 113/72, heart rate 70, respiratory rate 16, temperature 98, oxygen saturation 97%.  His lungs are clear to auscultation bilaterally, heart S1-S2, present rhythmic, soft abdomen, no lower extremity edema, he was confused and disoriented, positive tremors and shuffling gait. Head CT with bilateral stimulator devices with postoperative edema.  No hemorrhage or acute infarction. SARS COVID-19 negative, urinalysis negative for infection.   Follow up head CT with improving edema.    1.  Metabolic encephalopathy due to postoperative (deep brain stimulator) cerebral edema, complicated with delirium.  The patient was admitted to the medical ward, he had supportive medical therapy including frequent neuro checks.  His mentation has been improving.   Follow-up head CT 3 days after admission showed stable bifrontal neural stimulator leads, white matter low-density surroundings and relates in both  cerebral hemispheres slightly less prominent, consistent with improvement edema. Brain MRI still pending.  Patient clinically improving, will hold on steroids for now. Systemic steroids can trigger delirium and psychosis.  At discharge will hold on quetiapine and oxycodone.   2.  Hyponatremia.  Patient received supportive medical therapy with improvement of kidney function and electrolytes.  Discharge Na is 135   3.  Parkinson's disease.  Continue Sinemet.  Patient status post deep brain stimulator implanted July 27.  4.  Hypertension.  Continue blood pressure control with lisinopril.  5.  Hypothyroidism.  TSH 0.164, free T4 1.85.  Dose of levothyroxine was reduced from 150 to 125 mcg daily.  Follow-up thyroid function test in 6 weeks.  6.  B12 deficiency.  Continue supplementation.  7. Chronic iron deficiency anemia. Continue with iron supplementation.   Discharge Diagnoses:  Principal Problem:   Delirium Active Problems:   Parkinson's disease (HCC)   Chronic back pain   HTN (hypertension)   Hypothyroidism    Discharge Instructions   Allergies as of 06/21/2020   No Known Allergies     Medication List    STOP taking these medications   aspirin EC 81 MG tablet   oxyCODONE 15 MG immediate release tablet Commonly known as: ROXICODONE     TAKE these medications   acetaminophen 500 MG tablet Commonly known as: TYLENOL Take 500 mg by mouth every 6 (six) hours as needed (for pain.).   carbidopa-levodopa 25-100 MG tablet Commonly known as: SINEMET IR Take 1 tablet by mouth 4 (four) times daily.   cyclobenzaprine 10 MG tablet Commonly known as: FLEXERIL Take 10 mg by mouth 3 (three) times daily as needed for muscle spasms.   docusate sodium 100 MG  capsule Commonly known as: COLACE Take 100 mg by mouth 3 (three) times daily.   feeding supplement (ENSURE ENLIVE) Liqd Take 237 mLs by mouth 2 (two) times daily between meals.   ferrous sulfate 325 (65 FE) MG  tablet Take 325 mg by mouth 2 (two) times daily.   levothyroxine 125 MCG tablet Commonly known as: SYNTHROID Take 1 tablet (125 mcg total) by mouth daily before breakfast. Start taking on: June 22, 2020 What changed:   medication strength  how much to take   lisinopril 5 MG tablet Commonly known as: ZESTRIL Take 5 mg by mouth every evening.   multivitamin with minerals Tabs tablet Take 1 tablet by mouth daily. Centrum   olopatadine 0.1 % ophthalmic solution Commonly known as: PATANOL Place 1 drop into both eyes 2 (two) times daily as needed for allergies.   polyethylene glycol 17 g packet Commonly known as: MIRALAX / GLYCOLAX Take 17 g by mouth daily as needed for moderate constipation.   trihexyphenidyl 2 MG tablet Commonly known as: ARTANE Take 2 mg by mouth 3 (three) times daily with meals.   zolpidem 12.5 MG CR tablet Commonly known as: AMBIEN CR Take 12.5 mg by mouth at bedtime.       No Known Allergies  Consultations:  Neurology   Neuro surgery    Procedures/Studies: DG Chest 1 View  Result Date: 06/16/2020 CLINICAL DATA:  Delirium EXAM: CHEST  1 VIEW COMPARISON:  None. FINDINGS: Right-sided ascending stimulator generator. No acute consolidation or effusion. Normal heart size. Aortic atherosclerosis. No pneumothorax. Postsurgical changes at the right humeral head. Left shoulder replacement. IMPRESSION: No active disease. Electronically Signed   By: Jasmine Pang M.D.   On: 06/16/2020 20:29   CT HEAD WO CONTRAST  Result Date: 06/19/2020 CLINICAL DATA:  Follow-up neural stimulator leads and associated bifrontal edema. EXAM: CT HEAD WITHOUT CONTRAST TECHNIQUE: Contiguous axial images were obtained from the base of the skull through the vertex without intravenous contrast. COMPARISON:  06/16/2020 FINDINGS: Brain: Stable bifrontal neural stimulator leads extending into the upper midbrain bilaterally. Low density in the midbrain and pons bilaterally is  unchanged. White matter low density surrounding the leads in both cerebral hemispheres is slightly less prominent. No associated mass effect. No intracranial hemorrhage. Ill-defined low density in the right occipital lobe without significant change. Vascular: No hyperdense vessel or unexpected calcification. Skull: Stable bifrontal burr holes. Sinuses/Orbits: Unremarkable. Other: None. IMPRESSION: 1. Stable bifrontal neural stimulator leads extending into the upper midbrain bilaterally. 2. White matter low density surrounding the leads in both cerebral hemispheres is slightly less prominent. 3. Stable ill-defined low density in the midbrain and pons bilaterally, possibly representing ischemic changes. 4. Stable ill-defined low density in the right occipital lobe, possibly representing an area of subacute infarction. Electronically Signed   By: Beckie Salts M.D.   On: 06/19/2020 19:01   CT Head Wo Contrast  Result Date: 06/16/2020 CLINICAL DATA:  Headache. EXAM: CT HEAD WITHOUT CONTRAST TECHNIQUE: Contiguous axial images were obtained from the base of the skull through the vertex without intravenous contrast. COMPARISON:  None. FINDINGS: Brain: Mild diffuse cortical atrophy is noted. Mild chronic ischemic white matter disease is noted. Bilateral stimulator devices are seen extending from the previously described bifrontal craniotomies with tips in the expected position of the substantia nigra bilaterally. Mild low density is seen involving the subcortical white matter around the stimulator leads in both frontal lobes most consistent with postoperative edema. Ventricular size is within normal limits. No hemorrhage  or definite acute infarction is noted. No midline shift is noted. Vascular: No hyperdense vessel or unexpected calcification. Skull: Bilateral frontal craniotomies are noted secondary to stimulator device. Sinuses/Orbits: No acute finding. Other: None. IMPRESSION: Bilateral stimulator devices are seen  extending from the previously described bifrontal craniotomies with tips in the expected position of the substantia nigra bilaterally. Mild low density is seen involving the subcortical white matter around the stimulator leads in both frontal lobes most consistent with postoperative edema. No hemorrhage or definite acute infarction is noted. Mild diffuse cortical atrophy is noted. Mild chronic ischemic white matter disease is noted. Electronically Signed   By: Lupita Raider M.D.   On: 06/16/2020 16:11        Subjective: Patient I feeling better, his wife is at the bedside, able to respond to questions and follow commands. No confusion or agitation.   Discharge Exam: Vitals:   06/21/20 0330 06/21/20 0826  BP: 135/65 117/65  Pulse: 71 75  Resp: 18 18  Temp: 97.9 F (36.6 C) 98.8 F (37.1 C)  SpO2: 100% 100%   Vitals:   06/20/20 1948 06/20/20 2349 06/21/20 0330 06/21/20 0826  BP: 114/81 (!) 105/55 135/65 117/65  Pulse: 86 66 71 75  Resp: 16 18 18 18   Temp: 98.1 F (36.7 C) 98.6 F (37 C) 97.9 F (36.6 C) 98.8 F (37.1 C)  TempSrc: Oral Oral Oral Oral  SpO2: 100% 99% 100% 100%  Weight:      Height:        General: no in pain or dyspnea  Neurology: Awake and alert, non focal  E ENT: no pallor, no icterus, oral mucosa moist Cardiovascular: No JVD. S1-S2 present, rhythmic, no gallops, rubs, or murmurs. No lower extremity edema. Pulmonary: positive breath sounds bilaterally Gastrointestinal. Abdomen soft and non tender Skin. No rashes/ surgical wounds at the scalp.  Musculoskeletal: no joint deformities   The results of significant diagnostics from this hospitalization (including imaging, microbiology, ancillary and laboratory) are listed below for reference.     Microbiology: Recent Results (from the past 240 hour(s))  SARS Coronavirus 2 by RT PCR (hospital order, performed in [facility] hospital lab) Nasopharyngeal Nasopharyngeal Swab     Status: None   Collection  Time: 06/16/20  7:17 PM   Specimen: Nasopharyngeal Swab  Result Value Ref Range Status   SARS Coronavirus 2 NEGATIVE NEGATIVE Final    Comment: (NOTE) SARS-CoV-2 target nucleic acids are NOT DETECTED.  The SARS-CoV-2 RNA is generally detectable in upper and lower respiratory specimens during the acute phase of infection. The lowest concentration of SARS-CoV-2 viral copies this assay can detect is 250 copies / mL. A negative result does not preclude SARS-CoV-2 infection and should not be used as the sole basis for treatment or other patient management decisions.  A negative result may occur with improper specimen collection / handling, submission of specimen other than nasopharyngeal swab, presence of viral mutation(s) within the areas targeted by this assay, and inadequate number of viral copies (<250 copies / mL). A negative result must be combined with clinical observations, patient history, and epidemiological information.  Fact Sheet for Patients:   08/16/20  Fact Sheet for Healthcare Providers: BoilerBrush.com.cy  This test is not yet approved or  cleared by the https://pope.com/ FDA and has been authorized for detection and/or diagnosis of SARS-CoV-2 by FDA under an Emergency Use Authorization (EUA).  This EUA will remain in effect (meaning this test can be used) for the duration of  the COVID-19 declaration under Section 564(b)(1) of the Act, 21 U.S.C. section 360bbb-3(b)(1), unless the authorization is terminated or revoked sooner.  Performed at [facility], [address] [place], [place] [id number]      Labs: BNP (last 3 results) No results for input(s): BNP in the last 8760 hours. Basic Metabolic Panel: Recent Labs  Lab 06/16/20 1535 06/17/20 0247 06/20/20 1110  NA 133* 133* 135  K 4.1 3.9 4.2  CL 96* 97* 98  CO2 24 26 26   GLUCOSE 122* 129* 143*  BUN 18 13 17   CREATININE 0.67 0.73 0.67  CALCIUM  9.5 9.1 9.3   Liver Function Tests: Recent Labs  Lab 06/16/20 1535  AST 19  ALT 5  ALKPHOS 110  BILITOT 0.7  PROT 7.4  ALBUMIN 3.8   No results for input(s): LIPASE, AMYLASE in the last 168 hours. Recent Labs  Lab 06/18/20 1543  AMMONIA 18   CBC: Recent Labs  Lab 06/16/20 1535  WBC 5.0  NEUTROABS 2.4  HGB 12.4*  HCT 37.9*  MCV 91.3  PLT 283   Cardiac Enzymes: No results for input(s): CKTOTAL, CKMB, CKMBINDEX, TROPONINI in the last 168 hours. BNP: Invalid input(s): POCBNP CBG: No results for input(s): GLUCAP in the last 168 hours. D-Dimer No results for input(s): DDIMER in the last 72 hours. Hgb A1c No results for input(s): HGBA1C in the last 72 hours. Lipid Profile No results for input(s): CHOL, HDL, LDLCALC, TRIG, CHOLHDL, LDLDIRECT in the last 72 hours. Thyroid function studies No results for input(s): TSH, T4TOTAL, T3FREE, THYROIDAB in the last 72 hours.  Invalid input(s): FREET3 Anemia work up No results for input(s): VITAMINB12, FOLATE, FERRITIN, TIBC, IRON, RETICCTPCT in the last 72 hours. Urinalysis    Component Value Date/Time   COLORURINE YELLOW 06/16/2020 1812   APPEARANCEUR CLEAR 06/16/2020 1812   LABSPEC 1.026 06/16/2020 1812   PHURINE 5.0 06/16/2020 1812   GLUCOSEU NEGATIVE 06/16/2020 1812   HGBUR NEGATIVE 06/16/2020 1812   BILIRUBINUR NEGATIVE 06/16/2020 1812   KETONESUR 20 (A) 06/16/2020 1812   PROTEINUR NEGATIVE 06/16/2020 1812   NITRITE NEGATIVE 06/16/2020 1812   LEUKOCYTESUR NEGATIVE 06/16/2020 1812   Sepsis Labs Invalid input(s): PROCALCITONIN,  WBC,  LACTICIDVEN Microbiology Recent Results (from the past 240 hour(s))  SARS Coronavirus 2 by RT PCR (hospital order, performed in [facility] hospital lab) Nasopharyngeal Nasopharyngeal Swab     Status: None   Collection Time: 06/16/20  7:17 PM   Specimen: Nasopharyngeal Swab  Result Value Ref Range Status   SARS Coronavirus 2 NEGATIVE NEGATIVE Final    Comment: (NOTE) SARS-CoV-2  target nucleic acids are NOT DETECTED.  The SARS-CoV-2 RNA is generally detectable in upper and lower respiratory specimens during the acute phase of infection. The lowest concentration of SARS-CoV-2 viral copies this assay can detect is 250 copies / mL. A negative result does not preclude SARS-CoV-2 infection and should not be used as the sole basis for treatment or other patient management decisions.  A negative result may occur with improper specimen collection / handling, submission of specimen other than nasopharyngeal swab, presence of viral mutation(s) within the areas targeted by this assay, and inadequate number of viral copies (<250 copies / mL). A negative result must be combined with clinical observations, patient history, and epidemiological information.  Fact Sheet for Patients:   BoilerBrush.com.cyhttps://www.fda.gov/media/136312/download  Fact Sheet for Healthcare Providers: https://pope.com/https://www.fda.gov/media/136313/download  This test is not yet approved or  cleared by the [place] FDA and has been  authorized for detection and/or diagnosis of SARS-CoV-2 by FDA under an Emergency Use Authorization (EUA).  This EUA will remain in effect (meaning this test can be used) for the duration of the COVID-19 declaration under Section 564(b)(1) of the Act, 21 U.S.C. section 360bbb-3(b)(1), unless the authorization is terminated or revoked sooner.  Performed at [facility], [address] [place], [place] [address]      Time coordinating discharge: 45 minutes  SIGNED:   Coralie Keens, MD  [facility] 06/21/2020, 1:53 PM

## 2020-06-21 NOTE — Progress Notes (Signed)
Patient arrived on unit, oriented to unit. Reviewed medications, therapy schedule, rehab routine and plan of care. States an understanding of information reviewed. No complications noted at this time. AX1 Reginald Jenkins L Sheritha Louis

## 2020-06-21 NOTE — Progress Notes (Signed)
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 Since then, pt with increased confusion and agitation. CT revealed edema in bilateral frontal lobes around the electrodes from DBS.     PT Comments    Pt up in chair on arrival to room. Wife present and reports he was using his LUE to eat this afternoon. Pt at times impulsive, attempting to rise from chair several times with out therapist. Pt ambulated in hallway and became easily distracted, unable to find room when challenged. Patient would benefit from continued skilled PT to maximize safety with mobility and functional independence. Expected to d./c to CIR this afternoon.      Follow Up Recommendations  CIR;Supervision/Assistance - 24 hour     Equipment Recommendations  None recommended by PT    Recommendations for Other Services       Precautions / Restrictions Precautions Precautions: Fall Precaution Comments: wife denies fall history     Mobility  Bed Mobility Overal bed mobility: Needs Assistance             General bed mobility comments: up in chair on arrival with assist from wife.  Transfers Overall transfer level: Needs assistance Equipment used: None Transfers: Sit to/from Stand Sit to Stand: Min guard         General transfer comment: min guard for safety. Pt impulsive and attempting to rise several times w/o assist present.   Ambulation/Gait Ambulation/Gait assistance: Min assist Gait Distance (Feet): 225 Feet Assistive device: 1 person hand held assist Gait Pattern/deviations: Shuffle;Festinating;Trunk flexed;Narrow base of support Gait velocity: Decreased   General Gait Details: Pt given room finding challenge during ambulation. He was able to repeat the #13 when  asked but unable to locate room. Frequent cues given to guide pt and encourage logical reasoning to find rm; however pt ultimately needed to be guided back to room.    Stairs             Wheelchair Mobility    Modified Rankin (Stroke Patients Only)       Balance Overall balance assessment: Needs assistance Sitting-balance support: Feet supported;No upper extremity supported Sitting balance-Leahy Scale: Fair     Standing balance support: No upper extremity supported;During functional activity Standing balance-Leahy Scale: Fair Standing balance comment: As pt fatigued, poor                            Cognition Arousal/Alertness: Awake/alert Behavior During Therapy: Flat affect;Impulsive Overall Cognitive Status: Impaired/Different from baseline Area of Impairment: Orientation;Attention;Memory;Following commands;Problem solving;Safety/judgement                 Orientation Level: Disoriented to;Situation;Place;Time Current Attention Level: Focused Memory: Decreased short-term memory Following Commands: Follows one step commands inconsistently;Follows one step commands with increased time Safety/Judgement: Decreased awareness of safety;Decreased awareness of deficits   Problem Solving: Slow processing;Decreased initiation;Difficulty sequencing;Requires verbal cues;Requires tactile cues General Comments: Pt attempting to rise from chair several times w/o assist available. He became frequently distracted while ambulating in hallway. When instructed to don mask, pt attempted to put it on like a sock. Corrected that the most belonged on the face and pt attempted to use it like a had. Assist was provided to don mask properly.  Exercises      General Comments General comments (skin integrity, edema, etc.): wife present for session      Pertinent Vitals/Pain Pain Assessment: No/denies pain Faces Pain Scale: No hurt    Home Living                       Prior Function            PT Goals (current goals can now be found in the care plan section) Acute Rehab PT Goals Patient Stated Goal: for him to be able to take care of himself  PT Goal Formulation: Patient unable to participate in goal setting Time For Goal Achievement: 07/01/20 Potential to Achieve Goals: Good Progress towards PT goals: Progressing toward goals    Frequency    Min 3X/week      PT Plan Current plan remains appropriate    Co-evaluation              AM-PAC PT "6 Clicks" Mobility   Outcome Measure  Help needed turning from your back to your side while in a flat bed without using bedrails?: None Help needed moving from lying on your back to sitting on the side of a flat bed without using bedrails?: A Little Help needed moving to and from a bed to a chair (including a wheelchair)?: A Little Help needed standing up from a chair using your arms (e.g., wheelchair or bedside chair)?: A Little Help needed to walk in hospital room?: A Little Help needed climbing 3-5 steps with a railing? : A Little 6 Click Score: 19    End of Session Equipment Utilized During Treatment: Gait belt Activity Tolerance: Patient tolerated treatment well Patient left: with call bell/phone within reach;with family/visitor present;in chair;with chair alarm set Nurse Communication: Mobility status PT Visit Diagnosis: Difficulty in walking, not elsewhere classified (R26.2);Other abnormalities of gait and mobility (R26.89)     Time: 9983-3825 PT Time Calculation (min) (ACUTE ONLY): 19 min  Charges:  $Neuromuscular Re-education: 8-22 mins                    Kallie Locks, [place] Pager [phone] Acute Rehab   Sheral Apley 06/21/2020, 2:59 PM

## 2020-06-22 ENCOUNTER — Inpatient Hospital Stay (HOSPITAL_COMMUNITY): Payer: Non-veteran care | Admitting: Speech Pathology

## 2020-06-22 ENCOUNTER — Inpatient Hospital Stay (HOSPITAL_COMMUNITY): Payer: Non-veteran care

## 2020-06-22 ENCOUNTER — Encounter (HOSPITAL_COMMUNITY): Payer: Self-pay | Admitting: Physical Medicine and Rehabilitation

## 2020-06-22 DIAGNOSIS — R5381 Other malaise: Principal | ICD-10-CM

## 2020-06-22 LAB — METHYLMALONIC ACID, SERUM: Methylmalonic Acid, Quantitative: 2383 nmol/L — ABNORMAL HIGH (ref 0–378)

## 2020-06-22 LAB — CBC WITH DIFFERENTIAL/PLATELET
Abs Immature Granulocytes: 0.01 10*3/uL (ref 0.00–0.07)
Basophils Absolute: 0 10*3/uL (ref 0.0–0.1)
Basophils Relative: 1 %
Eosinophils Absolute: 0.2 10*3/uL (ref 0.0–0.5)
Eosinophils Relative: 4 %
HCT: 36.7 % — ABNORMAL LOW (ref 39.0–52.0)
Hemoglobin: 12.2 g/dL — ABNORMAL LOW (ref 13.0–17.0)
Immature Granulocytes: 0 %
Lymphocytes Relative: 28 %
Lymphs Abs: 1.5 10*3/uL (ref 0.7–4.0)
MCH: 29.7 pg (ref 26.0–34.0)
MCHC: 33.2 g/dL (ref 30.0–36.0)
MCV: 89.3 fL (ref 80.0–100.0)
Monocytes Absolute: 0.8 10*3/uL (ref 0.1–1.0)
Monocytes Relative: 16 %
Neutro Abs: 2.7 10*3/uL (ref 1.7–7.7)
Neutrophils Relative %: 51 %
Platelets: 285 10*3/uL (ref 150–400)
RBC: 4.11 MIL/uL — ABNORMAL LOW (ref 4.22–5.81)
RDW: 12.7 % (ref 11.5–15.5)
WBC: 5.3 10*3/uL (ref 4.0–10.5)
nRBC: 0 % (ref 0.0–0.2)

## 2020-06-22 LAB — COMPREHENSIVE METABOLIC PANEL
ALT: 9 U/L (ref 0–44)
AST: 20 U/L (ref 15–41)
Albumin: 3.6 g/dL (ref 3.5–5.0)
Alkaline Phosphatase: 114 U/L (ref 38–126)
Anion gap: 11 (ref 5–15)
BUN: 13 mg/dL (ref 8–23)
CO2: 26 mmol/L (ref 22–32)
Calcium: 9.4 mg/dL (ref 8.9–10.3)
Chloride: 100 mmol/L (ref 98–111)
Creatinine, Ser: 0.74 mg/dL (ref 0.61–1.24)
GFR calc Af Amer: >60 mL/min (ref >60)
GFR calc non Af Amer: >60 mL/min (ref >60)
Glucose, Bld: 132 mg/dL — ABNORMAL HIGH (ref 70–99)
Potassium: 4 mmol/L (ref 3.5–5.1)
Sodium: 137 mmol/L (ref 135–145)
Total Bilirubin: 0.6 mg/dL (ref 0.3–1.2)
Total Protein: 6.7 g/dL (ref 6.5–8.1)

## 2020-06-22 NOTE — Progress Notes (Signed)
Pt very disoriented , delirious and confused upon arrival. He has had some incomprehensible speech as well. Pt has been found on multiple occasions standing unasisted at bedside by this nurse. This nurse and nurse tech Cordie Grice. Have responded to bed alarm multiple times an hour since beginning of the shift. Staff has stayed in room with pt at times, and pt continues attempt to get up. He has been redirected on multiple occasions but it has not ben successful. Safety concern for pt, contacted Riley Lam and order for enclosure bed in place. No other concerns to report at this time, will continue to monitor.  Reginald Speakman Ezel, lpn

## 2020-06-22 NOTE — Progress Notes (Signed)
Initial Nutrition Assessment  DOCUMENTATION CODES:   Not applicable  INTERVENTION:   - Liberalize diet to Regular, verbal with readback order placed per MD  - Continue Ensure Enlive po BID, each supplement provides 350 kcal and 20 grams of protein  - MVI with minerals daily  NUTRITION DIAGNOSIS:   Increased nutrient needs related to other (therapies) as evidenced by estimated needs.  GOAL:   Patient will meet greater than or equal to 90% of their needs  MONITOR:   PO intake, Supplement acceptance, Labs, Weight trends, Skin  REASON FOR ASSESSMENT:   Malnutrition Screening Tool    ASSESSMENT:   74 year old male with PMH of Parkinson's disease s/p brain stimulator placement on 06/06/20, HTN, hypothyroidism, chronic back pain. Admitted to CIR on 06/21/20.   Spoke with pt and wife at bedside. Pt's wife provided majority of history. She states that pt has been eating well over the last 2 days and completed 100% of dinner the previous night and night before. Pt's wife was not here for breakfast. She reports that pt has likely lost some weight and that his UBW is 175 lbs. She has noticed that pt has lost weight just by looking at him. Most recent weight is recorded as 170 lbs.  Meal Completion: 50% x 1 recorded meal  Medications reviewed and include: sinemet, vitamin B-12 1000 mcg daily x 7 days, colace, Ensure Enlive BID, ferrous sulfate, liquid MVI  Labs reviewed: vitamin B-12 69  NUTRITION - FOCUSED PHYSICAL EXAM:    Most Recent Value  Orbital Region No depletion  Upper Arm Region Mild depletion  Thoracic and Lumbar Region No depletion  Buccal Region No depletion  Temple Region Mild depletion  Clavicle Bone Region Mild depletion  Clavicle and Acromion Bone Region Mild depletion  Scapular Bone Region No depletion  Dorsal Hand No depletion  Patellar Region No depletion  Anterior Thigh Region No depletion  Posterior Calf Region No depletion  Edema (RD Assessment) None   Hair Reviewed  Eyes Reviewed  Mouth Reviewed  Skin Reviewed  Nails Reviewed       Diet Order:   Diet Order            Diet regular Room service appropriate? Yes; Fluid consistency: Thin  Diet effective now                 EDUCATION NEEDS:   No education needs have been identified at this time  Skin:  Skin Assessment: Skin Integrity Issues: Incisions: head, left shoulder  Last BM:  06/21/20  Height:   Ht Readings from Last 1 Encounters:  06/21/20 5\' 7"  (1.702 m)    Weight:   Wt Readings from Last 1 Encounters:  06/21/20 77.1 kg    Ideal Body Weight:  67.3 kg  BMI:  Body mass index is 26.63 kg/m.  Estimated Nutritional Needs:   Kcal:  1900-2100  Protein:  110-115 grams  Fluid:  >/= 1.9 L    08/21/20, MS, RD, LDN Inpatient Clinical Dietitian Please see [facility] for contact information.

## 2020-06-22 NOTE — Evaluation (Signed)
Speech Language Pathology Assessment and Plan  Patient Details  Name: Reginald Jenkins MRN: [id number] Date of Birth: Apr 10, 1946  SLP Diagnosis: Cognitive Impairments;Dysphagia  Rehab Potential: Good ELOS:   10-12 days   Today's Date: 06/22/2020 SLP Individual Time: 6269-4854 SLP Individual Time Calculation (min): 60 min   Hospital Problem: Principal Problem:   Debility Active Problems:   Parkinson disease ([place])  Past Medical History:  Past Medical History:  Diagnosis Date  . Anemia    borderline  . 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 Arthritis   . Broken collarbone    as a child  . Dyspnea    when climbing stairs   . GERD (gastroesophageal reflux disease)   . Hypertension   . Hypothyroidism   . Parkinson disease ([place]) 2010   Past Surgical History:  Past Surgical History:  Procedure Laterality Date  . APPENDECTOMY    . arm surgery Left 1967   a rod was put in then 8 months it was removed  . CHOLECYSTECTOMY    . LAMINECTOMY  1986  . ROTATOR CUFF REPAIR Right 2001  . 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   Procedure: LEFT TOTAL SHOULDER ARTHROPLASTY;  Surgeon: Justice Britain, MD;  Location: [place];  Service: Orthopedics;  Laterality: Left;    Assessment / Plan / Recommendation Clinical Impression   Reginald Jenkins is a 74 year old right-handed male history of Parkinson's disease maintained on Sinemet as well as Artane followed by neurology Dr. Trellis Moment in [facility] status post brain stimulator placement at [facility] [place] on 06/06/2020, hypertension and hypothyroidism as well as chronic back pain maintained on oxycodone 15 mg 4 times daily followed by Dr. Delton See in [place].  History taken from chart review and wife due to cognition.  Patient lives with spouse.  Reportedly recently independent with steady decline in functional mobility as well as cognitive deficits.  1 level home with ramped entrance.  Cranial CT scan showed  bilateral stimulator device seen extending from the previously described bifrontal craniotomies with tips in the expected position of the substantia nigra bilaterally.  Mild low density seen involving the subcortical white matter around the stimulator leads in both frontal lobes most consistent with postoperative edema.  No hemorrhage or definite acute infarct noted.  Admission chemistry sodium 133, glucose 122 hemoglobin 12.4, TSH 0.164 and free T4 1.85, urinalysis negative nitrite, sedimentation rate 39, ammonia level 18, vitamin B12 69.  Neurology service as well as neurosurgery consulted with follow-up cranial CT scan 06/19/2020 stable bifrontal neurostimulator leads white matter low density surrounding leads in both cerebral hemispheres slightly less prominent than prior tracing.  Stable ill-defined low-density in the midbrain and pons bilaterally possibly representing ischemic changes.  Stable ill-defined low-density right occipital lobe possibly representing area of a subacute infarction and recommendations remained to continue to monitor and did remain on low-dose aspirin.  Follow-up MRI on 06/21/2020 showing edema around DBS leads bilaterally, no suspected infection.  Small bilateral occipital subdural hematomas. [name] delirium agitation has improved and his Seroquel was discontinued.  He was on low-dose Ambien as prior to admission reduced to 5 mg.  No current plan for EEG.  Patient was placed on B12 1000 mcg intramuscularly daily x7 days.  Therapy evaluations completed and patient was admitted for a comprehensive rehab program.   Patient presents with a mod-severe cognitive-linguistic disorder and a mild oral phase dysphagia which is likely impacted by patient's cognitive function. Patient is exhibiting significant deficits  in areas of attention, orientation to place and time, awareness, functional problem solving, organization. He was easily distracted by surroundings, and exhibited poor attention and  organization when eating meal (moving from one food item to the next, placing items in different place on tray each time, etc.) He was able to state "[facility]" when SLP cued him that he was in a 'hospital'. He was able to give full name and birthdate but stated current age as 74. He did eventually state that month was "June or July". Memory was not fully assessed due to patient's significant impairment in attention and awareness. He would intermittently look in direction of mirror over sink in room and call out to his wife or daughter who were not present in room. [name] dysphagia is impacted by his attention, resulting in one instance of throat clearing with large sip of milk. SLP decided against putting patient's dentures in due to patient currently very confused and in vail bed.     Skilled Therapeutic Interventions          Bedside swallow evaluation, cognitive-linguistic evaluation  SLP Assessment  Patient will need skilled Speech Lanaguage Pathology Services during CIR admission    Recommendations  SLP Diet Recommendations: Thin;Age appropriate regular solids Liquid Administration via: Cup;Straw Medication Administration: Crushed with puree Compensations: Minimize environmental distractions;Slow rate;Small sips/bites Postural Changes and/or Swallow Maneuvers: Seated upright 90 degrees Oral Care Recommendations: Oral care BID;Staff/trained caregiver to provide oral care Recommendations for Other Services: Neuropsych consult Follow up Recommendations: 24 hour supervision/assistance;Home Health SLP;Skilled Nursing facility Equipment Recommended: None recommended by SLP    SLP Frequency 3 to 5 out of 7 days   SLP Duration  SLP Intensity  SLP Treatment/Interventions  10-12 days  Minumum of 1-2 x/day, 30 to 90 minutes  Cognitive remediation/compensation;Functional tasks;Patient/family education;Environmental Environmental consultant;Internal/external aids;Speech/Language  facilitation;Therapeutic Activities    Pain Pain Assessment Pain Scale: Faces Faces Pain Scale: No hurt  Prior Functioning Cognitive/Linguistic Baseline: Baseline deficits Baseline deficit details: Reportedly recently independent with steady decline in functional mobility as well as cognitive deficits Type of Home: House  Lives With: Spouse Available Help at Discharge: Family;Available 24 hours/day Vocation: Other (Comment) (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, systems Overall Cognitive Status: Impaired/Different from baseline Arousal/Alertness: Awake/alert Orientation Level: Oriented to person;Disoriented to time;Disoriented to situation;Disoriented to place (was able to state "[facility]" when informed he was in a hospital and was asked which one) Attention: Focused Focused Attention: Impaired Focused Attention Impairment: Verbal basic;Functional basic Memory: Impaired Memory Impairment: Other (comment) (attention poor and memory not fully assessed) Immediate Memory Recall: Blue;Sock Memory Recall Sock: Not able to recall Memory Recall Blue: Not able to recall Memory Recall Bed: Not able to recall Awareness: Impaired Awareness Impairment: Intellectual impairment Problem Solving: Impaired Problem Solving Impairment: Verbal basic;Functional basic Executive Function: Reasoning;Decision Making Reasoning: Impaired Reasoning Impairment: Verbal basic;Functional basic Decision Making: Impaired Decision Making Impairment: Verbal basic;Functional basic Safety/Judgment: Impaired  Comprehension Auditory Comprehension Overall Auditory Comprehension: Impaired Yes/No Questions: Impaired Basic Biographical Questions: 76-100% accurate Basic Immediate Environment Questions: 25-49% accurate Commands: Within Functional Limits (at basic level) Conversation: Simple Interfering Components: Attention EffectiveTechniques: Extra processing  time Visual Recognition/Discrimination Discrimination: Not tested Reading Comprehension Reading Status: Within funtional limits (At word level) Expression Expression Primary Mode of Expression: Verbal Verbal Expression Overall Verbal Expression: Impaired Initiation: No impairment Automatic Speech: Name;Social Response Level of Generative/Spontaneous Verbalization: Phrase Naming: Impairment Responsive: 76-100% accurate Confrontation: Impaired Verbal Errors: Language of  confusion Pragmatics: Impairment Impairments: Abnormal affect;Monotone;Eye contact Interfering Components: Attention Effective Techniques: Written cues;Open ended questions Non-Verbal Means of Communication: Not applicable Written Expression Dominant Hand: Right Oral Motor Oral Motor/Sensory Function Overall Oral Motor/Sensory Function: Generalized oral weakness Facial ROM: Within Functional Limits Facial Symmetry: Within Functional Limits Facial Strength: Reduced right;Reduced left Facial Sensation: Within Functional Limits Lingual ROM: Within Functional Limits Lingual Symmetry: Within Functional Limits Lingual Strength: Within Functional Limits Lingual Sensation: Within Functional Limits Velum: Within Functional Limits Mandible: Within Functional Limits Motor Speech Overall Motor Speech: Appears within functional limits for tasks assessed Respiration: Within functional limits Resonance: Within functional limits Articulation: Within functional limitis Intelligibility: Intelligible Motor Planning: Witnin functional limits  Care Tool Care Tool Cognition Expression of Ideas and Wants Expression of Ideas and Wants: Frequent difficulty - frequently exhibits difficulty with expressing needs and ideas   Understanding Verbal and Non-Verbal Content Understanding Verbal and Non-Verbal Content: Sometimes understands - understands only basic conversations or simple, direct phrases. Frequently requires cues to  understand   Memory/Recall Ability *first 3 days only Memory/Recall Ability *first 3 days only: That he or she is in a hospital/hospital unit     PMSV Assessment  PMSV Trial Intelligibility: Intelligible  Bedside Swallowing Assessment General Date of Onset: 06/21/20 Previous Swallow Assessment: N/A Diet Prior to this Study: Regular;Thin liquids Temperature Spikes Noted: No Respiratory Status: Room air Behavior/Cognition: Alert;Cooperative;Pleasant mood;Confused;Distractible;Requires cueing Oral Cavity - Dentition: Missing dentition;Edentulous (top dentures not placed secondary to patient confusion) Self-Feeding Abilities: Needs set up Patient Positioning: Upright in bed Baseline Vocal Quality: Normal Volitional Cough: Strong Volitional Swallow: Unable to elicit  Oral Care Assessment   Ice Chips   Thin Liquid Thin Liquid: Impaired Presentation: Self Fed;Straw;Cup Other Comments: one instance of throat clear following large sip of liquids at beginning of evaluation Nectar Thick   Honey Thick   Puree   Solid Solid: Impaired Oral Phase Impairments: Impaired mastication Other Comments: suspect dentures not being in place impacted mastication BSE Assessment Risk for Aspiration Impact on safety and function: Mild aspiration risk Other Related Risk Factors: Cognitive impairment;History of GERD  Short Term Goals: Week 1: SLP Short Term Goal 1 (Week 1): Patient will attend to basic functional tasks with moderate frequency of cues to redirect. SLP Short Term Goal 2 (Week 1): Patient will complete basic level functional problem solving tasks with moderate A and cues. SLP Short Term Goal 3 (Week 1): Patient will safely consume least restrictive oral diet at setup and supervision A. SLP Short Term Goal 4 (Week 1): Patient will utilize visual aids for orientation to place, time, situation with moderate A cues. SLP Short Term Goal 5 (Week 1): Patient will initiate verbal requests  for specific wants/needs with min A cues.  Refer to Care Plan for Long Term Goals  Recommendations for other services: Neuropsych  Discharge Criteria: Patient will be discharged from SLP if patient refuses treatment 3 consecutive times without medical reason, if treatment goals not met, if there is a change in medical status, if patient makes no progress towards goals or if patient is discharged from hospital.  The above assessment, treatment plan, treatment alternatives and goals were discussed and mutually agreed upon: No family available/patient unable  Dannial Monarch 06/22/2020, 3:23 PM   Sonia Baller, [place], [place] 06/22/20 3:55 PM

## 2020-06-22 NOTE — Evaluation (Signed)
Occupational Therapy Assessment and Plan  Patient Details  Name: Reginald Jenkins MRN: [id number] Date of Birth: 1946-09-22  OT Diagnosis: abnormal posture, apraxia, ataxia, cognitive deficits and muscle weakness (generalized) Rehab Potential: Rehab Potential (ACUTE ONLY): Good ELOS: 10-12   Today's Date: 06/22/2020 OT Individual Time: 4818-5631 OT Individual Time Calculation (min): 75 min     Hospital Problem: Principal Problem:   Debility Active Problems:   Parkinson disease ([place])   Past Medical History:  Past Medical History:  Diagnosis Date  . Anemia    borderline  . 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 Arthritis   . Broken collarbone    as a child  . Dyspnea    when climbing stairs   . GERD (gastroesophageal reflux disease)   . Hypertension   . Hypothyroidism   . Parkinson disease ([place]) 2010   Past Surgical History:  Past Surgical History:  Procedure Laterality Date  . APPENDECTOMY    . arm surgery Left 1967   a rod was put in then 8 months it was removed  . CHOLECYSTECTOMY    . LAMINECTOMY  1986  . ROTATOR CUFF REPAIR Right 2001  . 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 Since then, pt with increased confusion and agitation. CT revealed edema in bilateral frontal lobes around the electrodes from DBS.  Patient currently requires min- max with basic self-care skills secondary to muscle weakness, decreased cardiorespiratoy endurance, impaired timing and sequencing, abnormal tone, unbalanced muscle activation, motor apraxia, decreased coordination and decreased motor planning, decreased attention to left, decreased attention, decreased awareness,  decreased problem solving, decreased safety awareness and decreased memory and decreased sitting balance, decreased standing balance, decreased postural control and decreased balance strategies.  Prior to hospitalization, patient could complete BADL with independent .  Patient will benefit from skilled intervention to decrease level of assist with basic self-care skills and increase independence with basic self-care skills prior to discharge home with care partner.  Anticipate patient will require 24 hour supervision and follow up home health.  OT - End of Session Activity Tolerance: Tolerates 30+ min activity with multiple rests Endurance Deficit: Yes OT Assessment Rehab Potential (ACUTE ONLY): Good OT Patient demonstrates impairments in the following area(s): Balance;Cognition;Edema;Endurance;Motor;Nutrition;Perception;Safety;Skin Integrity;Sensory;Vision OT Basic ADL's Functional Problem(s): Grooming;Eating;Bathing;Dressing;Toileting OT Transfers Functional Problem(s): Tub/Shower;Toilet OT Plan OT Intensity: Minimum of 1-2 x/day, 45 to 90 minutes OT Frequency: 5 out of 7 days OT Duration/Estimated Length of Stay: 10-12 OT Treatment/Interventions: Balance/vestibular training;Discharge planning;Pain management;Self Care/advanced ADL retraining;Therapeutic Activities;UE/LE Coordination activities;Cognitive remediation/compensation;Disease mangement/prevention;Functional mobility training;Patient/family education;Skin care/wound managment;Therapeutic Exercise;Visual/perceptual remediation/compensation;UE/LE Strength taining/ROM;Splinting/orthotics;Psychosocial support;Neuromuscular re-education;DME/adaptive equipment instruction;Community reintegration;Wheelchair propulsion/positioning OT Self Feeding Anticipated Outcome(s): S OT Basic Self-Care Anticipated Outcome(s): S OT Toileting Anticipated Outcome(s): S OT Bathroom Transfers Anticipated Outcome(s): S OT Recommendation Patient destination:  Home Follow Up Recommendations: Home health OT Equipment Recommended: To be determined Equipment Details: [facility] to [facility] shower seat and grab bars   OT Evaluation Precautions/Restrictions  Precautions Precautions: Fall Precaution Comments: wife denies fall history  Restrictions Weight Bearing Restrictions: No General Chart Reviewed: Yes Family/Caregiver Present: No Vital Signs  Pain Pain Assessment Pain Scale: Faces Faces Pain Scale: No hurt Home Living/Prior Functioning Home  Living Family/patient expects to be discharged to:: Private residence Living Arrangements: Spouse/significant other Available Help at Discharge: Family, Available 24 hours/day Type of Home: House Home Access: [place]: One level Bathroom Shower/Tub: Multimedia programmer: Handicapped height Bathroom Accessibility: Yes Additional Comments: wife reports that the [place] is getting them a shower seat and she is having someone install grab bars in the bathroom   Lives With: Spouse Prior Function Level of Independence: Independent with basic ADLs, Independent with homemaking with ambulation Vocation: Other (Comment) (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) Vision Baseline Vision/History: Wears glasses Wears Glasses: At all times Vision Assessment?: Vision impaired- to be further tested in functional context Perception    decreased attention for L Praxis Praxis: Impaired Praxis Impairment Details: Initiation;Motor planning Cognition Overall Cognitive Status: Impaired/Different from baseline Arousal/Alertness: Awake/alert Orientation Level: Person;Place;Situation Person: Oriented Place: Disoriented Situation: Disoriented Year: Other (Comment) (1947) Month: July Day of Week: Correct Memory: Impaired Memory Impairment: Other (comment) (attention poor and memory not fully assessed) Immediate Memory Recall: Blue;Sock Memory Recall Sock: Not able to  recall Memory Recall Blue: Not able to recall Memory Recall Bed: Not able to recall Attention: Focused Focused Attention: Impaired Focused Attention Impairment: Verbal basic;Functional basic Awareness: Impaired Awareness Impairment: Intellectual impairment Problem Solving: Impaired Problem Solving Impairment: Verbal basic;Functional basic Executive Function: Reasoning;Decision Making Reasoning: Impaired Reasoning Impairment: Verbal basic;Functional basic Decision Making: Impaired Decision Making Impairment: Verbal basic;Functional basic Safety/Judgment: Impaired Sensation Sensation Light Touch: Impaired Detail Light Touch Impaired Details: Impaired LUE;Impaired LLE (question attention) Coordination Gross Motor Movements are Fluid and Coordinated: No Fine Motor Movements are Fluid and Coordinated: No Motor  Motor Motor: Abnormal postural alignment and control Motor - Skilled Clinical Observations: parkinsons tremors RUE>LUE; festinating gait  Trunk/Postural Assessment  Cervical Assessment Cervical Assessment:  (head forward) Thoracic Assessment Thoracic Assessment:  (rounded shoulders) Lumbar Assessment Lumbar Assessment:  (post pelvic preference) Postural Control Postural Control: Deficits on evaluation Righting Reactions: delayed Protective Responses: delayed  Balance Balance Balance Assessed: Yes Dynamic Sitting Balance Dynamic Sitting - Level of Assistance: 5: Stand by assistance Dynamic Standing Balance Dynamic Standing - Level of Assistance: 4: Min assist;3: Mod assist Dynamic Standing - Comments: occasional post lean Extremity/Trunk Assessment RUE Assessment RUE Assessment: Exceptions to [facility] General Strength Comments: RUE tremors>LUE; 4/5 strength LUE Assessment General Strength Comments: decreased coordination LUE>RUE, however decreased tremors LUE  Care Tool Care Tool Self Care Eating   Eating Assist Level: Moderate Assistance - Patient 50 - 74%     Oral Care    Oral Care Assist Level: Moderate Assistance - Patient 50 - 74%    Bathing   Body parts bathed by patient: Chest;Abdomen;Front perineal area;Face Body parts bathed by helper: Right arm;Left arm;Buttocks;Right upper leg;Left upper leg;Right lower leg;Left lower leg   Assist Level: Maximal Assistance - Patient 24 - 49%    Upper Body Dressing(including orthotics)   What is the patient wearing?: Pull over shirt   Assist Level: Moderate Assistance - Patient 50 - 74%    Lower Body Dressing (excluding footwear)   What is the patient wearing?: Underwear/pull up;Pants Assist for lower body dressing: Maximal Assistance - Patient 25 - 49%    Putting on/Taking off footwear   What is the patient wearing?: Socks;Shoes Assist for footwear: Moderate Assistance - Patient 50 - 74%       Care Tool Toileting Toileting activity         Care Tool Bed Mobility Roll left and right activity  Sit to lying activity        Lying to sitting edge of bed activity         Care Tool Transfers Sit to stand transfer        Chair/bed transfer         Toilet transfer   Assist Level: Minimal Assistance - Patient > 75%     Care Tool Cognition Expression of Ideas and Wants Expression of Ideas and Wants: Frequent difficulty - frequently exhibits difficulty with expressing needs and ideas   Understanding Verbal and Non-Verbal Content Understanding Verbal and Non-Verbal Content: Sometimes understands - understands only basic conversations or simple, direct phrases. Frequently requires cues to understand   Memory/Recall Ability *first 3 days only Memory/Recall Ability *first 3 days only: That he or she is in a hospital/hospital unit    Refer to Care Plan for [place] 1 OT Short Term Goal 1 (Week 1): Pt will transfer to toilet wiht CGA and LRAD OT Short Term Goal 2 (Week 1): Pt will bathe body parts with MOD VC to demo improved sequencing OT Short  Term Goal 3 (Week 1): Pt will don shirt with S OT Short Term Goal 4 (Week 1): Pt will don LB clothing wiht min VC for threading BLE seated and CGA for balance  Recommendations for other services: None    Skilled Therapeutic Intervention 1:1. Pt and wife present and educated on OT role/purpose, ELOS, POC and goal setting. Pt completes full ADL at shower level see details below for assist levels. Pt mostly impacted by cognitive deficits (memory, awareness, attention etc) impacting performance of BADLs as pt unable to sequence, continue or terminate tasks appropriately nearly every 30 seconds attempting to get up and move to another seat. Pt demo festinating gait with all transfers and occasional freezing episodes walking to/out of bathroom threshold with MIN A overall. Exited session with pt seated in enclosure bed, exit alarm on and call light in reach  ADL ADL Upper Body Bathing: Moderate assistance Where Assessed-Upper Body Bathing: Shower Lower Body Bathing: Maximal assistance Where Assessed-Lower Body Bathing: Shower Upper Body Dressing: Moderate assistance Where Assessed-Upper Body Dressing: Edge of bed Lower Body Dressing: Moderate assistance Where Assessed-Lower Body Dressing: Edge of bed Toileting: Maximal assistance Toilet Transfer: Minimal assistance Toilet Transfer Method: Magazine features editor: Minimal assistance Social research officer, government Method: Heritage manager: Medical illustrator Sit to Stand: Minimal Assistance - Patient > 75% Stand to Sit: Minimal Assistance - Patient > 75%   Discharge Criteria: Patient will be discharged from OT if patient refuses treatment 3 consecutive times without medical reason, if treatment goals not met, if there is a change in medical status, if patient makes no progress towards goals or if patient is discharged from hospital.  The above assessment, treatment plan, treatment alternatives and goals were  discussed and mutually agreed upon: by patient and by family  Tonny Branch 06/22/2020, 3:19 PM

## 2020-06-22 NOTE — Progress Notes (Signed)
Enclosure bed arrived, pt placed in bed with no issues. Pts wife was contacted and she is aware that patient is now in enclosure bed. Pt offered restroom and liquids. No other issues to report.  Shagun Wordell, lpn

## 2020-06-22 NOTE — Progress Notes (Signed)
[name] bed alarm was going off. When arrived at his room, patient had gotten up out of bed & sat in his recliner. He was assisted back to bed & it was explained for him to call for assistance when he wanted to ambulate. The pad alarm was on the recliner & the alarm was activated. Call bell was placed within reach. His nurse was informed.

## 2020-06-22 NOTE — Progress Notes (Signed)
On assessment pt noted to have a red rash on the left side of his face that are irregular and round in shape. Pt's wife states that these have been present since before transfer to [facility]. Pt denies discomfort from rash and wife states she has not seen pt scratching.

## 2020-06-22 NOTE — Evaluation (Signed)
Physical Therapy Assessment and Plan  Patient Details  Name: Reginald Jenkins MRN: [id number] Date of Birth: August 09, 1946  PT Diagnosis: Difficulty walking and Muscle weakness Rehab Potential: Good ELOS: 10-12 days   Today's Date: 06/22/2020 PT Individual Time: 0950-1059 PT Individual Time Calculation (min): 69 min    Hospital Problem: Principal Problem:   Debility Active Problems:   Parkinson disease ([place])   Past Medical History:  Past Medical History:  Diagnosis Date  . Anemia    borderline  . 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 Arthritis   . Broken collarbone    as a child  . Dyspnea    when climbing stairs   . GERD (gastroesophageal reflux disease)   . Hypertension   . Hypothyroidism   . Parkinson disease ([place]) 2010   Past Surgical History:  Past Surgical History:  Procedure Laterality Date  . APPENDECTOMY    . arm surgery Left 1967   a rod was put in then 8 months it was removed  . CHOLECYSTECTOMY    . LAMINECTOMY  1986  . ROTATOR CUFF REPAIR Right 2001  . 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   Procedure: LEFT TOTAL SHOULDER ARTHROPLASTY;  Surgeon: Justice Britain, MD;  Location: [facility];  Service: Orthopedics;  Laterality: Left;    Assessment & Plan Clinical Impression: Patient is a 74 year old right-handed male history of Parkinson's disease maintained on Sinemet as well as Artanefollowed by neurology Dr. Debbora Presto [place] status [facility], hypertension and hypothyroidism as well as chronicbackpain maintained on oxycodone 15 mg 4 times dailyfollowed by Dr. [place].History taken from chart review and wife due to cognition.[name]. Reportedly recently independent with steady decline in functional mobility as well as cognitive deficits. 1 level home with ramped entrance. Cranial CT scan showed bilateral stimulator deviceseen  extending from the previously described bifrontal craniotomies with tips in the expected position of the substantia nigra bilaterally. Mild low density seen involving the subcortical white matter around the stimulator leads in both frontal lobes most consistent with postoperative edema. No hemorrhage or definite acute infarct noted. Admission chemistry sodium 133,glucose 122 hemoglobin 12.4, TSH 0.164 and free T4 1.85, urinalysis negative nitrite, sedimentation rate 39, ammonia level 18, vitamin B12 69. Neurology service as well as neurosurgery consulted with follow-up cranial CT scan 06/19/2020 stable bifrontal neurostimulator leads white matter low density surrounding leads in both cerebral hemispheres slightly less prominent than prior tracing. Stable ill-defined low-density in the midbrain and pons bilaterally possibly representing ischemic changes. Stable ill-defined low-density right occipital lobe possibly representing area of a subacute infarction and recommendations remained to continue to monitor and did remain on low-dose aspirin. Follow-up MRI on 06/21/2020 showing edema around DBS leads bilaterally, no suspected infection. Small bilateral occipital subdural hematomas. [name] delirium agitation has improved and his Seroquel was discontinued. He was on low-dose Ambien as prior to admission reduced to 5 mg. Nocurrent plan for EEG. Patient was placed on B12 1000 mcg intramuscularly daily x7 days.   Patient transferred to CIR on 06/21/2020 .   Patient currently requires min with mobility secondary to muscle weakness, decreased cardiorespiratoy endurance, decreased motor planning, decreased initiation, decreased awareness, decreased problem solving, decreased safety awareness, decreased memory and delayed processing and decreased sitting balance, decreased standing balance, decreased postural control and decreased balance strategies.  Prior to hospitalization, patient was independent  with  mobility and lived with Spouse in a House home.  Home access is  Ramped entrance.  Patient will benefit from skilled PT intervention to maximize safe functional mobility, minimize fall risk and decrease caregiver burden for planned discharge home with 24 hour supervision.  Anticipate patient will benefit from follow up OP at discharge.  PT - End of Session Endurance Deficit: Yes   PT Evaluation Precautions/Restrictions Precautions Precautions: Fall Precaution Comments: wife denies fall history  Restrictions Weight Bearing Restrictions: No General Chart Reviewed: Yes Family/Caregiver Present: No  Home Living/Prior Functioning Home Living Available Help at Discharge: Family;Available 24 hours/day Type of Home: House Home Access: Ramped entrance Home Layout: One level Bathroom Shower/Tub: Multimedia programmer: Handicapped height Bathroom Accessibility: Yes Additional Comments: wife reports that the [place] is getting them a shower seat and she is having someone install grab bars in the bathroom   Lives With: Spouse Prior Function Level of Independence: Independent with basic ADLs;Independent with homemaking with ambulation Vocation: Other (Comment) (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) Vision/Perception  Praxis Praxis: Impaired Praxis Impairment Details: Initiation;Motor planning  Cognition Overall Cognitive Status: Impaired/Different from baseline Arousal/Alertness: Awake/alert Orientation Level: Oriented to person;Disoriented to time;Disoriented to situation;Disoriented to place (was able to state "[facility]" when informed he was in a hospital and was asked which one) Attention: Focused Focused Attention: Impaired Focused Attention Impairment: Verbal basic;Functional basic Memory: Impaired Memory Impairment: Other (comment) (attention poor and memory not fully assessed) Immediate Memory Recall: Blue;Sock Memory Recall Sock: Not able to  recall Memory Recall Blue: Not able to recall Memory Recall Bed: Not able to recall Awareness: Impaired Awareness Impairment: Intellectual impairment Problem Solving: Impaired Problem Solving Impairment: Verbal basic;Functional basic Executive Function: Reasoning;Decision Making Reasoning: Impaired Reasoning Impairment: Verbal basic;Functional basic Decision Making: Impaired Decision Making Impairment: Verbal basic;Functional basic Safety/Judgment: Impaired Sensation Sensation Light Touch: Impaired Detail Light Touch Impaired Details: Impaired LUE;Impaired LLE (question attention) Coordination Gross Motor Movements are Fluid and Coordinated: No Fine Motor Movements are Fluid and Coordinated: No Motor  Motor Motor: Abnormal postural alignment and control Motor - Skilled Clinical Observations: parkinsons tremors RUE>LUE; festinating gait  Trunk/Postural Assessment  Cervical Assessment Cervical Assessment:  (head forward) Thoracic Assessment Thoracic Assessment:  (rounded shoulders) Lumbar Assessment Lumbar Assessment:  (post pelvic preference) Postural Control Postural Control: Deficits on evaluation Righting Reactions: delayed Protective Responses: delayed  Balance Balance Balance Assessed: Yes Dynamic Sitting Balance Dynamic Sitting - Level of Assistance: 5: Stand by assistance Dynamic Standing Balance Dynamic Standing - Level of Assistance: 4: Min assist;3: Mod assist Dynamic Standing - Comments: occasional post lean Extremity Assessment  RUE Assessment RUE Assessment: Exceptions to [facility] General Strength Comments: RUE tremors>LUE; 4/5 strength LUE Assessment General Strength Comments: decreased coordination LUE>RUE, however decreased tremors LUE RLE Assessment RLE Assessment: Within Functional Limits General Strength Comments: Hipi flexion 3/5, Otherwise grossly 4+/5 LLE Assessment LLE Assessment: Within Functional Limits General Strength Comments: Grossly  4+/5  Care Tool Care Tool Bed Mobility Roll left and right activity   Roll left and right assist level: Supervision/Verbal cueing    Sit to lying activity   Sit to lying assist level: Minimal Assistance - Patient > 75%    Lying to sitting edge of bed activity   Lying to sitting edge of bed assist level: Minimal Assistance - Patient > 75%     Care Tool Transfers Sit to stand transfer   Sit to stand assist level: Minimal Assistance - Patient > 75%    Chair/bed transfer   Chair/bed transfer assist level: Minimal  Assistance - Patient > 75%     Toilet transfer   Assist Level: Minimal Assistance - Patient > 75%    Car transfer   Car transfer assist level: Minimal Assistance - Patient > 75%      Care Tool Locomotion Ambulation   Assist level: Minimal Assistance - Patient > 75% Assistive device: No Device Max distance: 200'  Walk 10 feet activity   Assist level: Minimal Assistance - Patient > 75% Assistive device: No Device   Walk 50 feet with 2 turns activity   Assist level: Minimal Assistance - Patient > 75% Assistive device: No Device  Walk 150 feet activity   Assist level: Supervision/Verbal cueing Assistive device: No Device  Walk 10 feet on uneven surfaces activity   Assist level: Minimal Assistance - Patient > 75%    Stairs   Assist level: Minimal Assistance - Patient > 75% Stairs assistive device: 2 hand rails Max number of stairs: 12  Walk up/down 1 step activity   Walk up/down 1 step (curb) assist level: Minimal Assistance - Patient > 75% Walk up/down 1 step or curb assistive device: 2 hand rails    Walk up/down 4 steps activity Walk up/down 4 steps assist level: Minimal Assistance - Patient > 75% Walk up/down 4 steps assistive device: 2 hand rails  Walk up/down 12 steps activity   Walk up/down 12 steps assist level: Minimal Assistance - Patient > 75% Walk up/down 12 steps assistive device: 2 hand rails  Pick up small objects from floor Pick up small  object from the floor (from standing position) activity did not occur: Safety/medical concerns      Wheelchair Will patient use wheelchair at discharge?: No          Wheel 50 feet with 2 turns activity      Wheel 150 feet activity        Refer to Care Plan for Long Term Goals  SHORT TERM GOAL WEEK 1 PT Short Term Goal 1 (Week 1): Pt will perform bed mobility with CGA PT Short Term Goal 2 (Week 1): Pt will perform bed to chair transfer with CGA PT Short Term Goal 3 (Week 1): Pt will ambulate 150' with CGA and LRAD  Recommendations for other services: None   Skilled Therapeutic Intervention  Evaluation completed (see details above and below) with education on PT POC and goals and individual treatment initiated with focus on bed mobility, balance, transfers, and ambulation.  Pt received seated in posey bed and agreeable to therapy. No report of pain. Stand step transfer to toilet with RW and minA. Following toileting, pt transfers to [facility] with minA. WC transport to gym for time management. Pt performs car transfer and ramp navigation with minA and tactile and verbal cues for sequencing and positioning. Pt ambulates 200' without AD and PT providing minA at hips for stability and to facilitate pelvic rotation and lateral weight shifting. Pt has parkinsonian gait pattern and occasional festination, but improves stride length with cuing. Pt performs 12 steps with BHRs and minA. Sit<>supine on mat table with minA at trunk and cues for sequencing. WC transport back to room and SPT to bed with minA. Pt left in posey with all needs within reach.   Mobility Bed Mobility Bed Mobility: Supine to Sit;Sit to Supine Supine to Sit: Minimal Assistance - Patient > 75% Sit to Supine: Minimal Assistance - Patient > 75% Transfers Transfers: Sit to Stand;Stand Pivot Transfers;Stand to Sit Sit to Stand: Minimal Assistance - Patient >  75% Stand to Sit: Minimal Assistance - Patient > 75% Stand Pivot  Transfers: Minimal Assistance - Patient > 75% Stand Pivot Transfer Details: Manual facilitation for weight shifting Transfer (Assistive device): None Locomotion  Gait Ambulation: Yes Gait Assistance: Minimal Assistance - Patient > 75% Gait Distance (Feet): 200 Feet Assistive device: None Gait Assistance Details: Tactile cues for posture;Verbal cues for sequencing;Verbal cues for gait pattern;Tactile cues for weight shifting Gait Gait: Yes Gait Pattern: Impaired Gait Pattern: Festinating;Trunk flexed;Decreased trunk rotation Gait velocity: Decreased Stairs / Additional Locomotion Stairs: Yes Stairs Assistance: Minimal Assistance - Patient > 75% Stair Management Technique: Two rails Number of Stairs: 12 Height of Stairs: 6 Ramp: Minimal Assistance - Patient >75% Curb: Minimal Assistance - Patient >75% Wheelchair Mobility Wheelchair Mobility: No   Discharge Criteria: Patient will be discharged from PT if patient refuses treatment 3 consecutive times without medical reason, if treatment goals not met, if there is a change in medical status, if patient makes no progress towards goals or if patient is discharged from hospital.  The above assessment, treatment plan, treatment alternatives and goals were discussed and mutually agreed upon: by patient  Breck Coons, PT, DPT 06/22/2020, 3:54 PM

## 2020-06-22 NOTE — Progress Notes (Signed)
[place] PHYSICAL MEDICINE & REHABILITATION PROGRESS NOTE   Subjective/Complaints:  Pt reports he slept OK- couldn't remember when had last BM- said "wife is at a meeting at noon"- it's 8am in the morning.   Denies pain.   ROS: Limited by limited cognition- moderate-to severe parkinson's disease Objective:   MR ANGIO HEAD WO CONTRAST  Result Date: 06/21/2020 CLINICAL DATA:  Parkinson's. Deep brain stimulator placement 06/06/2020 in [place]. Postprocedural confusion and irritable behavior. EXAM: MRI HEAD WITHOUT AND WITH CONTRAST MRA HEAD WITHOUT CONTRAST TECHNIQUE: Multiplanar, multiecho pulse sequences of the brain and surrounding structures were obtained without and with intravenous contrast. Angiographic images of the head were obtained using MRA technique without contrast. CONTRAST:  23mL GADAVIST GADOBUTROL 1 MMOL/ML IV SOLN COMPARISON:  CT head 06/19/2020 FINDINGS: MRI HEAD FINDINGS Brain: Bilateral deep brain stimulators extend through the thalamus and into the midbrain bilaterally. There is moderate edema surrounding both leads similar to that seen on the recent CT. No associated restricted diffusion or hemorrhage in the edema. Following contrast infusion, no abnormal enhancement is seen surrounding the leads to suggest infection. Ventricle size normal. Generalized atrophy. Negative for acute infarct. No mass lesion. There are small posterior subdural hematomas bilaterally in the occipital region. These contain methemoglobin suggesting subacute duration and measure approximately 3 mm each. There is also a small amount of blood extending along the falx posteriorly. Vascular: Normal arterial flow voids Skull and upper cervical spine: No focal skeletal lesion. Sinuses/Orbits: Mild mucosal edema paranasal sinuses. Negative orbit Other: None MRA HEAD FINDINGS Right vertebral artery dominant. Both vertebral arteries contribute to the basilar but mostly on the right. PICA patent  bilaterally. Basilar patent. AICA, superior cerebellar, posterior cerebral arteries patent bilaterally fetal origin left posterior cerebral artery. Decreased signal in the right posterior cerebral artery is most likely related to artifact. Internal carotid artery widely patent bilaterally. Anterior and middle cerebral arteries patent bilaterally. Decreased signal in the middle cerebral arteries bilaterally due to artifact. Negative for cerebral aneurysm. IMPRESSION: 1. Edema surrounding the deep brain stimulator leads bilaterally. No restricted diffusion, hemorrhage, or enhancement to suggest infection around the leads. 2. Small occipital subdural hematoma bilaterally which appears subacute with methemoglobin. 3. Negative for acute infarct. 4. Negative MRA Electronically Signed   By: Marlan Palau M.D.   On: 06/21/2020 15:04   MR BRAIN W WO CONTRAST  Result Date: 06/21/2020 CLINICAL DATA:  Parkinson's. Deep brain stimulator placement 06/06/2020 in [place]. Postprocedural confusion and irritable behavior. EXAM: MRI HEAD WITHOUT AND WITH CONTRAST MRA HEAD WITHOUT CONTRAST TECHNIQUE: Multiplanar, multiecho pulse sequences of the brain and surrounding structures were obtained without and with intravenous contrast. Angiographic images of the head were obtained using MRA technique without contrast. CONTRAST:  49mL GADAVIST GADOBUTROL 1 MMOL/ML IV SOLN COMPARISON:  CT head 06/19/2020 FINDINGS: MRI HEAD FINDINGS Brain: Bilateral deep brain stimulators extend through the thalamus and into the midbrain bilaterally. There is moderate edema surrounding both leads similar to that seen on the recent CT. No associated restricted diffusion or hemorrhage in the edema. Following contrast infusion, no abnormal enhancement is seen surrounding the leads to suggest infection. Ventricle size normal. Generalized atrophy. Negative for acute infarct. No mass lesion. There are small posterior subdural hematomas bilaterally in  the occipital region. These contain methemoglobin suggesting subacute duration and measure approximately 3 mm each. There is also a small amount of blood extending along the falx posteriorly. Vascular: Normal arterial flow voids Skull and upper cervical spine: No  focal skeletal lesion. Sinuses/Orbits: Mild mucosal edema paranasal sinuses. Negative orbit Other: None MRA HEAD FINDINGS Right vertebral artery dominant. Both vertebral arteries contribute to the basilar but mostly on the right. PICA patent bilaterally. Basilar patent. AICA, superior cerebellar, posterior cerebral arteries patent bilaterally fetal origin left posterior cerebral artery. Decreased signal in the right posterior cerebral artery is most likely related to artifact. Internal carotid artery widely patent bilaterally. Anterior and middle cerebral arteries patent bilaterally. Decreased signal in the middle cerebral arteries bilaterally due to artifact. Negative for cerebral aneurysm. IMPRESSION: 1. Edema surrounding the deep brain stimulator leads bilaterally. No restricted diffusion, hemorrhage, or enhancement to suggest infection around the leads. 2. Small occipital subdural hematoma bilaterally which appears subacute with methemoglobin. 3. Negative for acute infarct. 4. Negative MRA Electronically Signed   By: Marlan Palau M.D.   On: 06/21/2020 15:04   Recent Labs    06/22/20 0641  WBC 5.3  HGB 12.2*  HCT 36.7*  PLT 285   Recent Labs    06/20/20 1110 06/22/20 0641  NA 135 137  K 4.2 4.0  CL 98 100  CO2 26 26  GLUCOSE 143* 132*  BUN 17 13  CREATININE 0.67 0.74  CALCIUM 9.3 9.4    Intake/Output Summary (Last 24 hours) at 06/22/2020 0849 Last data filed at 06/22/2020 0536 Gross per 24 hour  Intake 530 ml  Output 300 ml  Net 230 ml     Physical Exam: Vital Signs Blood pressure (!) 169/66, pulse (!) 59, temperature (!) 97.4 F (36.3 C), resp. rate 18, height 5\' 7"  (1.702 m), weight 77.1 kg, SpO2 100 %.  Physical  Exam Vitalsreviewed.  Constitutional:  General: He is not in acute distress. Sitting up, trying to get out of Posey bed that's enclosed/locked, NAD HENT: : Staples intact to sites, bilaterally, after recent DBS stimulator placement Cardiovascular: borderline bradycardia- regular rhythm Pulmonary: CTA B/L- no W/R/R- good air movement Abdominal: Soft, NT, ND, (+)BS  Musculoskeletal: : No edema or tenderness in extremities Skin:: Scalp with staples bilateral CDI Neurological: Pt Ox1 only   Mild dysarthria  He does have a resting tremor with rigidity to all 4 limbs- no change. Motor: Grossly 5/5 throughout Psychiatric:  Comments: Limited due to mentation.   Assessment/Plan: 1. 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  Physiatrist is providing close team supervision and 24 hour management of active medical problems listed below.  Physiatrist and rehab team continue to assess barriers to discharge/monitor patient progress toward functional and medical goals  Care Tool:  Bathing              Bathing assist       Upper Body Dressing/Undressing Upper body dressing   What is the patient wearing?: Hospital gown only    Upper body assist Assist Level: Contact Guard/Touching assist    Lower Body Dressing/Undressing Lower body dressing      What is the patient wearing?: Underwear/pull up     Lower body assist Assist for lower body dressing: Minimal Assistance - Patient > 75%     Toileting Toileting    Toileting assist Assist for toileting: 2 Helpers (pt confused)     Transfers Chair/bed transfer  Transfers assist     Chair/bed transfer assist level: Independent with assistive device Chair/bed transfer assistive device: [id number]   Ambulation assist  Walk 10 feet activity   Assist           Walk 50 feet activity   Assist           Walk 150 feet activity   Assist           Walk 10 feet on uneven surface  activity   Assist           Wheelchair     Assist               Wheelchair 50 feet with 2 turns activity    Assist            Wheelchair 150 feet activity     Assist          Blood pressure (!) 169/66, pulse (!) 59, temperature (!) 97.4 F (36.3 C), resp. rate 18, height 5\' 7"  (1.702 m), weight 77.1 kg, SpO2 100 %.  Medical Problem List and Plan: 1.Decreased functional mobilitysecondary to Parkinson's disease [facility] -patientmay notshower -ELOS/Goals: 6-9 days/Mod I/supervision Admit to CIR 2. Antithrombotics:SCDs -DVT/anticoagulation:  -antiplatelet therapy: Aspirin 81 mg daily 3. Pain Management/chronicbackpain:Currently on home regimen of oxycodone 15 mg 4 times daily, Flexeril 10 mg 3 times daily as needed. Monitor mental status  8/12- c/o no pain this AM- nursing went in after I did to give AM meds.  Monitor with increased exertion. 4. Mood:Ambien as prior to admission decreased to 5 mg nightly -antipsychotic agents: Seroquel 12.5 mg nightly as needed  8/12- Posey bed ordered by NP overnight last night- pt kept getting out of bed- >3x overnight.  5. Neuropsych: This patientisNOTcapable of making decisions on hisown behalf. 6. Skin/Wound Care:Routine skin checks  8/12- will need to determine when surgery was, to determine when to remove staples 7. Fluids/Electrolytes/Nutrition:Routine in and outs. CMP ordered for tomorrow a.m. 8.Parkinson's disease. Continue Sinemet 25-100 mg 4 times daily as well as Artane 2 mg 3 times daily 9. Hypertension. Lisinopril 5 mg daily.   Monitor with increased mobility. 10. B12 deficiency. Complete 7-daycourse of vitamin B12 1000 mcg daily x7 days 11. Hypothyroidism. Continue Synthroid    LOS: 1 days A FACE TO FACE EVALUATION WAS PERFORMED  Hafiz Irion 06/22/2020, 8:49 AM

## 2020-06-22 NOTE — Progress Notes (Signed)
Inpatient Rehabilitation  Patient information reviewed and entered into eRehab system by Evelette Hollern M. Finley Dinkel, M.A., CCC/SLP, PPS Coordinator.  Information including medical coding, functional ability and quality indicators will be reviewed and updated through discharge.    

## 2020-06-23 ENCOUNTER — Inpatient Hospital Stay (HOSPITAL_COMMUNITY): Payer: Non-veteran care | Admitting: Occupational Therapy

## 2020-06-23 ENCOUNTER — Inpatient Hospital Stay (HOSPITAL_COMMUNITY): Payer: Non-veteran care | Admitting: Speech Pathology

## 2020-06-23 ENCOUNTER — Inpatient Hospital Stay (HOSPITAL_COMMUNITY): Payer: Non-veteran care

## 2020-06-23 MED ORDER — OXYCODONE HCL 5 MG PO TABS
15.0000 mg | ORAL_TABLET | Freq: Three times a day (TID) | ORAL | Status: DC
  Administered 2020-06-23: 15 mg via ORAL
  Filled 2020-06-23 (×5): qty 3

## 2020-06-23 NOTE — Care Management (Signed)
[facility]patient Rehabilitation Center Individual Statement of Services  Patient Name:  Reginald Jenkins  Date:  06/23/2020  Welcome to the Inpatient Rehabilitation Center.  Our goal is to provide you with an individualized program based on your diagnosis and situation, designed to meet your specific needs.  With this comprehensive rehabilitation program, you will be expected to participate in at least 3 hours of rehabilitation therapies Monday-Friday, with modified therapy programming on the weekends.  Your rehabilitation program will include the following services:  Physical Therapy (PT), Occupational Therapy (OT), Speech Therapy (ST), 24 hour per day rehabilitation nursing, Therapeutic Recreaction (TR), Psychology, Neuropsychology, Care Coordinator, Rehabilitation Medicine, Nutrition Services, Pharmacy Services and Other  Weekly team conferences will be held on Tuesdays to discuss your progress.  Your Inpatient Rehabilitation Care Coordinator will talk with you frequently to get your input and to update you on team discussions.  Team conferences with you and your family in attendance may also be held.  Expected length of stay: 10-12 days   Overall anticipated outcome: Supervision  Depending on your progress and recovery, your program may change. Your Inpatient Rehabilitation Care Coordinator will coordinate services and will keep you informed of any changes. Your Inpatient Rehabilitation Care Coordinator's name and contact numbers are listed  below.  The following services may also be recommended but are not provided by the Inpatient Rehabilitation Center:   Driving Evaluations  Home Health Rehabiltiation Services  Outpatient Rehabilitation Services  Vocational Rehabilitation   Arrangements will be made to provide these services after discharge if needed.  Arrangements include referral to agencies that provide these services.  Your insurance has been verified to be:  Medicare A/B and  [facility]  Your primary doctor is:  [name]  Pertinent information will be shared with your doctor and your insurance company.  Inpatient Rehabilitation Care Coordinator:  Susie Cassette [phone] or [phone]  Information discussed with and copy given to patient by: Gretchen Short, 06/23/2020, 4:50 PM

## 2020-06-23 NOTE — Progress Notes (Signed)
Speech Language Pathology Daily Session Note  Patient Details  Name: Reginald Jenkins MRN: [id number] Date of Birth: 10/27/1946  Today's Date: 06/23/2020 SLP Individual Time: 1405-1500 SLP Individual Time Calculation (min): 55 min  Short Term Goals: Week 1: SLP Short Term Goal 1 (Week 1): Patient will attend to basic functional tasks with moderate frequency of cues to redirect. SLP Short Term Goal 2 (Week 1): Patient will complete basic level functional problem solving tasks with moderate A and cues. SLP Short Term Goal 3 (Week 1): Patient will safely consume least restrictive oral diet at setup and supervision A. SLP Short Term Goal 4 (Week 1): Patient will utilize visual aids for orientation to place, time, situation with moderate A cues. SLP Short Term Goal 5 (Week 1): Patient will initiate verbal requests for specific wants/needs with min A cues.  Skilled Therapeutic Interventions:   Patient seen by SLP for skilled therapeutic intervention of cognitive-linguistic goals, with patient's wife present in room. SLP provided education to wife regarding patient's current deficits and wife provided SLP with information regarding his PLOF. She stated that prior to surgery, he was completely independent with all ADL's. During today's session, patient was sitting in recliner with wife sitting next to him. He required maximal A cues for attention to locate pictures when named in field of 4. He was able to describe actions/scenarios in photo cards with mod cues for maintaining attention and topic. Patient required maximal cues (tactile, visual, verbal) to reach for objects to complete basic task of placing color peg into hole and patient appeared to have very poor sense of where objects were in space. He did exhibit some improvement in eye contact and interaction with SLP, but continues to require significant cues to attend to objects when presented. Patient did exhibit some improvement in performing task of  picking up small peg from SLP's hand and placing it near hole on peg board. He perseverated on objects and comments and had difficulty transitioning from one thought or task to another. He continues to benefit from skilled ST services to maximize his cognitive-linguistic abilities prior to discharge.  Pain Pain Assessment Pain Scale: 0-10 Pain Score: 0-No pain  Therapy/Group: Individual Therapy  Angela Nevin, MA, CCC-SLP Speech Therapy

## 2020-06-23 NOTE — Progress Notes (Signed)
Physical Therapy Session Note  Patient Details  Name: Reginald Jenkins MRN: [id number] Date of Birth: 11/10/1946  Today's Date: 06/23/2020 PT Individual Time: 6144-3154 PT Individual Time Calculation (min): 60 min  and Today's Date: 06/23/2020 PT Missed Time: 15 Minutes Missed Time Reason: Patient fatigue  Short Term Goals: Week 1:  PT Short Term Goal 1 (Week 1): Pt will perform bed mobility with CGA PT Short Term Goal 2 (Week 1): Pt will perform bed to chair transfer with CGA PT Short Term Goal 3 (Week 1): Pt will ambulate 150' with CGA and LRAD  Skilled Therapeutic Interventions/Progress Updates:     Pt received seated in recliner with wife present. Agreeable to therapy. Pt asked if he can identify wife and he says that she is his sister-in-law, [name]. No pain reported.  Sit to stand with minA. Pt ambulates 159' with minA at hips to facilitate pelvic rotation and lateral weight shifts, with verbal cues for upright gaze and increased bilateral stride length.   Pt performs NMR for standing balance as well as functional transfer training. Pt holds 1kg ball with BUEs and performs sit to stand, then lifts ball overhead and back down, returning to a seated position. 2x5 with rest break between sets. Ball then placed on opposite end of room and pt instructed to retrieve ball and put in basketball goal. Pt walks by ball and requires max curing to turn around and tactile cues to turn head to locate ball, then mod cues to find basketball goal.   Pt tosses ball at trampoline and catches rebound to work on dynamic standing balance, large amplitude movements, and balance reaction. Pt able to complete with both 1kg ball and green "kickball". Occasionally pt misses trampoline and with minA pt able to ambulate to retrieve ball by bending forward to pick it up. Pt becomes easily distracted and attempts to pick up other objects instead of ball, requiring cuing to redirect. Pt also performs lateral ball toss  to work on trunk rotation and across body movements.  Pt ambulates 200' to Nustep with minA for straight line ambulation and modA for turns. Pt performs Nustep x5:00 with several brief rest breaks. Performed for reciprocal coordination training with large amplitude movements.  Pt ambulates 100' back to room with minA. Sit to supine with CGA. Left supine in posey bed with all needs within reach.  Pt misses final 15 minutes of session due to fatigue and requesting to return to room.  Therapy Documentation Precautions:  Precautions Precautions: Fall Precaution Comments: wife denies fall history  Restrictions Weight Bearing Restrictions: No    Therapy/Group: Individual Therapy  Beau Fanny, PT, DPT 06/23/2020, 5:21 PM

## 2020-06-23 NOTE — Progress Notes (Addendum)
Patient Details  Name: Reginald Jenkins MRN: [id number] Date of Birth: [birth year]  Today's Date: 06/23/2020  Hospital Problems: Principal Problem:   Debility Active Problems:   Parkinson disease Reginald Jenkins [facility])  Past Medical History:  Past Medical History:  Diagnosis Date  . Anemia    borderline  . 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 Arthritis   . Broken collarbone    as a child  . Dyspnea    when climbing stairs   . GERD (gastroesophageal reflux disease)   . Hypertension   . Hypothyroidism   . Parkinson disease ([place]) 2010   Past Surgical History:  Past Surgical History:  Procedure Laterality Date  . APPENDECTOMY    . arm surgery Left 1967   a rod was put in then 8 months it was removed  . CHOLECYSTECTOMY    . LAMINECTOMY  1986  . ROTATOR CUFF REPAIR Right 2001  . 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 His smoking use included cigarettes. He has a 1.50 pack-year smoking history. He quit smokeless tobacco use about 31 years ago.  His smokeless tobacco use included chew. He reports that he does not drink alcohol and does not use drugs.  Family / Support Systems Marital Status: Married How Long?: 32 years Patient Roles: Spouse Spouse/Significant Other: Reginald Jenkins [phone]) Children: 3 adult children Other Supports: children Anticipated Caregiver: Wife to be primary caregiver with support from children PRN Ability/Limitations of Caregiver: None reported Caregiver Availability: 24/7 Family Dynamics: Pt is retired  and lives with his wife.  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 Education: high school Read:  Yes Write: Yes Employment Status: Retired Date Retired/Disabled/Unemployed: 2005 Public relations account executive Issues: Denies Guardian/Conservator: N/A   Abuse/Neglect Abuse/Neglect Assessment Can Be Completed: Yes Physical Abuse: Denies Verbal Abuse: Denies Sexual Abuse: Denies Exploitation of patient/patient's resources: Denies Self-Neglect: Denies  Emotional Status Pt's affect, behavior and adjustment status: Pt pleasant and restless at time of visit. Recent Psychosocial Issues: N/A Psychiatric History: Hx of parkinson's disease Substance Abuse History: Denies; pt quit smoking cigarettes oiver 75yr ago.  Patient / Family Perceptions, Expectations & Goals Pt/Family understanding of illness & functional limitations: Pt wife has general understanding of care needs Premorbid pt/family roles/activities: Assitance with IADLs Anticipated changes in roles/activities/participation: Assistance with ADLs/IADLs  [place] None Premorbid Home Care/DME Agencies: None Transportation available at discharge: wife  Discharge Planning Living Arrangements: Spouse/significant other Support Systems: Spouse/significant other, Children Type of Residence: Private residence Insurance Resources: [employer] PMultimedia programmer[place]specify[place] Financial Resources: SSI, Family Support Financial Screen Referred: No Living Expenses: Own Money Management: Spouse Does the patient have any problems obtaining your medications?: No Care Coordinator Barriers to Discharge: Decreased caregiver support, Lack of/limited family support Care Coordinator Anticipated Follow Up Needs: HH/OP  Clinical Impression SW met with tp and pt wife in room to introduce self, explain role, and discuss discharge process. Pt allowed wife to answer questions during assessment. Despite pt restlessness, pt was able to answer concrete questions appropriately. Pt has no HCPOA. DME: canes, potty chair with out  crailes over toilet seat, and will have grab bars installed in  shower. Pt gets medications through [place]. PCP is Dr. Trellis Moment at [facility] in [place]. Wife reports 60% service connection. Pain Management doctor is Dr. Micheline Maze in [place], [place]. [facility] to get insight on services available.   Physical address: [address] [place] [address]  Reginald Jenkins 06/23/2020, 4:46 PM

## 2020-06-23 NOTE — Progress Notes (Signed)
[facility] PHYSICAL MEDICINE & REHABILITATION PROGRESS NOTE   Subjective/Complaints:  Per pt's wife, pt had LBM 2 days ago- usually goes 2x/week.   Wife wants me to decrease oxycodone to help with constipation And cognition.   Working with OT to get pants on.    ROS:  limited by cognition   Objective:   MR ANGIO HEAD WO CONTRAST  Result Date: 06/21/2020 CLINICAL DATA:  Parkinson's. Deep brain stimulator placement 06/06/2020 in [place]. Postprocedural confusion and irritable behavior. EXAM: MRI HEAD WITHOUT AND WITH CONTRAST MRA HEAD WITHOUT CONTRAST TECHNIQUE: Multiplanar, multiecho pulse sequences of the brain and surrounding structures were obtained without and with intravenous contrast. Angiographic images of the head were obtained using MRA technique without contrast. CONTRAST:  67mL GADAVIST GADOBUTROL 1 MMOL/ML IV SOLN COMPARISON:  CT head 06/19/2020 FINDINGS: MRI HEAD FINDINGS Brain: Bilateral deep brain stimulators extend through the thalamus and into the midbrain bilaterally. There is moderate edema surrounding both leads similar to that seen on the recent CT. No associated restricted diffusion or hemorrhage in the edema. Following contrast infusion, no abnormal enhancement is seen surrounding the leads to suggest infection. Ventricle size normal. Generalized atrophy. Negative for acute infarct. No mass lesion. There are small posterior subdural hematomas bilaterally in the occipital region. These contain methemoglobin suggesting subacute duration and measure approximately 3 mm each. There is also a small amount of blood extending along the falx posteriorly. Vascular: Normal arterial flow voids Skull and upper cervical spine: No focal skeletal lesion. Sinuses/Orbits: Mild mucosal edema paranasal sinuses. Negative orbit Other: None MRA HEAD FINDINGS Right vertebral artery dominant. Both vertebral arteries contribute to the basilar but mostly on the right. PICA patent  bilaterally. Basilar patent. AICA, superior cerebellar, posterior cerebral arteries patent bilaterally fetal origin left posterior cerebral artery. Decreased signal in the right posterior cerebral artery is most likely related to artifact. Internal carotid artery widely patent bilaterally. Anterior and middle cerebral arteries patent bilaterally. Decreased signal in the middle cerebral arteries bilaterally due to artifact. Negative for cerebral aneurysm. IMPRESSION: 1. Edema surrounding the deep brain stimulator leads bilaterally. No restricted diffusion, hemorrhage, or enhancement to suggest infection around the leads. 2. Small occipital subdural hematoma bilaterally which appears subacute with methemoglobin. 3. Negative for acute infarct. 4. Negative MRA Electronically Signed   By: Marlan Palau M.D.   On: 06/21/2020 15:04   MR BRAIN W WO CONTRAST  Result Date: 06/21/2020 CLINICAL DATA:  Parkinson's. Deep brain stimulator placement 06/06/2020 in [place]. Postprocedural confusion and irritable behavior. EXAM: MRI HEAD WITHOUT AND WITH CONTRAST MRA HEAD WITHOUT CONTRAST TECHNIQUE: Multiplanar, multiecho pulse sequences of the brain and surrounding structures were obtained without and with intravenous contrast. Angiographic images of the head were obtained using MRA technique without contrast. CONTRAST:  41mL GADAVIST GADOBUTROL 1 MMOL/ML IV SOLN COMPARISON:  CT head 06/19/2020 FINDINGS: MRI HEAD FINDINGS Brain: Bilateral deep brain stimulators extend through the thalamus and into the midbrain bilaterally. There is moderate edema surrounding both leads similar to that seen on the recent CT. No associated restricted diffusion or hemorrhage in the edema. Following contrast infusion, no abnormal enhancement is seen surrounding the leads to suggest infection. Ventricle size normal. Generalized atrophy. Negative for acute infarct. No mass lesion. There are small posterior subdural hematomas bilaterally in  the occipital region. These contain methemoglobin suggesting subacute duration and measure approximately 3 mm each. There is also a small amount of blood extending along the falx posteriorly. Vascular: Normal arterial flow voids  Skull and upper cervical spine: No focal skeletal lesion. Sinuses/Orbits: Mild mucosal edema paranasal sinuses. Negative orbit Other: None MRA HEAD FINDINGS Right vertebral artery dominant. Both vertebral arteries contribute to the basilar but mostly on the right. PICA patent bilaterally. Basilar patent. AICA, superior cerebellar, posterior cerebral arteries patent bilaterally fetal origin left posterior cerebral artery. Decreased signal in the right posterior cerebral artery is most likely related to artifact. Internal carotid artery widely patent bilaterally. Anterior and middle cerebral arteries patent bilaterally. Decreased signal in the middle cerebral arteries bilaterally due to artifact. Negative for cerebral aneurysm. IMPRESSION: 1. Edema surrounding the deep brain stimulator leads bilaterally. No restricted diffusion, hemorrhage, or enhancement to suggest infection around the leads. 2. Small occipital subdural hematoma bilaterally which appears subacute with methemoglobin. 3. Negative for acute infarct. 4. Negative MRA Electronically Signed   By: Marlan Palau M.D.   On: 06/21/2020 15:04   Recent Labs    06/22/20 0641  WBC 5.3  HGB 12.2*  HCT 36.7*  PLT 285   Recent Labs    06/20/20 1110 06/22/20 0641  NA 135 137  K 4.2 4.0  CL 98 100  CO2 26 26  GLUCOSE 143* 132*  BUN 17 13  CREATININE 0.67 0.74  CALCIUM 9.3 9.4    Intake/Output Summary (Last 24 hours) at 06/23/2020 0853 Last data filed at 06/23/2020 0617 Gross per 24 hour  Intake 170 ml  Output --  Net 170 ml     Physical Exam: Vital Signs Blood pressure 109/82, pulse 70, temperature 98.5 F (36.9 C), temperature source Oral, resp. rate 14, height 5\' 7"  (1.702 m), weight 77.1 kg, SpO2 100  %.  Physical Exam Vitalsreviewed.  Constitutional:  General: pt standing with therapy to get pants on; wife at bedside, NAD HENT: : Staples intact to sites, bilaterally, after recent DBS stimulator placement- looks the same Cardiovascular: RRR Pulmonary: CTA B/L- no W/R/R- good air movement Abdominal: Soft, NT, ND, (+)BS  Musculoskeletal: : No edema or tenderness in extremities Skin:: Scalp with staples bilateral CDI Neurological: Pt Ox1 only - no change  Mild dysarthria  He does have a resting tremor esp in RUE- fast tremor Motor: Grossly 5/5 throughout Psychiatric:  Comments: Limited due to mentation.   Assessment/Plan: 1. 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  Physiatrist is providing close team supervision and 24 hour management of active medical problems listed below.  Physiatrist and rehab team continue to assess barriers to discharge/monitor patient progress toward functional and medical goals  Care Tool:  Bathing    Body parts bathed by patient: Chest, Abdomen, Front perineal area, Face   Body parts bathed by helper: Right arm, Left arm, Chest, Abdomen, Front perineal area, Buttocks, Right upper leg, Right lower leg, Left upper leg, Left lower leg, Face     Bathing assist Assist Level: Total Assistance - Patient < 25%     Upper Body Dressing/Undressing Upper body dressing   What is the patient wearing?: Hospital gown only    Upper body assist Assist Level: Maximal Assistance - Patient 25 - 49%    Lower Body Dressing/Undressing Lower body dressing      What is the patient wearing?: Incontinence brief     Lower body assist Assist for lower body dressing: Maximal Assistance - Patient 25 - 49%     Toileting Toileting    Toileting assist  Assist for toileting: 2 Helpers  (pt confused)     Transfers Chair/bed transfer  Transfers assist     Chair/bed transfer assist level: Minimal Assistance - Patient > 75% Chair/bed transfer assistive device: Geologist, engineering   Ambulation assist      Assist level: Minimal Assistance - Patient > 75% Assistive device: No Device Max distance: 200'   Walk 10 feet activity   Assist     Assist level: Minimal Assistance - Patient > 75% Assistive device: No Device   Walk 50 feet activity   Assist    Assist level: Minimal Assistance - Patient > 75% Assistive device: No Device    Walk 150 feet activity   Assist    Assist level: Supervision/Verbal cueing Assistive device: No Device    Walk 10 feet on uneven surface  activity   Assist     Assist level: Minimal Assistance - Patient > 75%     Wheelchair     Assist Will patient use wheelchair at discharge?: No             Wheelchair 50 feet with 2 turns activity    Assist            Wheelchair 150 feet activity     Assist          Blood pressure 109/82, pulse 70, temperature 98.5 F (36.9 C), temperature source Oral, resp. rate 14, height 5\' 7"  (1.702 m), weight 77.1 kg, SpO2 100 %.  Medical Problem List and Plan: 1.Decreased functional mobilitysecondary to Parkinson's disease [facility] -patientmay notshower  8/13- will d/c staples- and then pt can shower -ELOS/Goals: 6-9 days/Mod I/supervision Admit to CIR 2. Antithrombotics:SCDs -DVT/anticoagulation:  -antiplatelet therapy: Aspirin 81 mg daily 3. Pain Management/chronicbackpain:Currently on home regimen of oxycodone 15 mg 4 times daily, Flexeril 10 mg 3 times daily as needed. Monitor mental status  8/12- c/o no pain this AM- nursing went in after I did to give AM meds.   8/13- will decrease to  TID per wife's request- if tolerates well, Monday will decrease to BID Monitor with increased exertion. 4. Mood:Ambien as prior to admission decreased to 5 mg nightly -antipsychotic agents: Seroquel 12.5 mg nightly as needed  8/12- P[place]y bed ordered by NP overnight last night- pt kept getting out of bed- >3x overnight.  5. Neuropsych: This patientisNOTcapable of making decisions on hisown behalf. 6. Skin/Wound Care:Routine skin checks  8/12- will need to determine when surgery was, to determine when to remove staples 7. Fluids/Electrolytes/Nutrition:Routine in and outs. CMP ordered for tomorrow a.m. 8.Parkinson's disease. Continue Sinemet 25-100 mg 4 times daily as well as Artane 2 mg 3 times daily 9. Hypertension. Lisinopril 5 mg daily.  Monitor with increased mobility. 10. B12 deficiency. Complete 7-daycourse of vitamin B12 1000 mcg daily x7 days 11. Hypothyroidism. Continue Synthroid    LOS: 2 days A FACE TO FACE EVALUATION WAS PERFORMED  Shambhavi Salley 06/23/2020, 8:53 AM

## 2020-06-23 NOTE — IPOC Note (Signed)
Overall Plan of Care [facility]) Patient Details Name: Reginald Jenkins MRN: [id number] DOB: Apr 01, 1946  Admitting Diagnosis: Debility  Hospital Problems: Principal Problem:   Debility Active Problems:   Parkinson disease (HCC)     Functional Problem List: Nursing Behavior, Pain, Safety, Endurance  PT Balance, Behavior, Endurance, Motor, Pain, Perception, Safety  OT Balance, Cognition, Edema, Endurance, Motor, Nutrition, Perception, Safety, Skin Integrity, Sensory, Vision  SLP Behavior, Cognition, Safety, Linguistic  TR         Basic ADLs: OT Grooming, Eating, Bathing, Dressing, Toileting     Advanced  ADLs: OT       Transfers: PT Bed Mobility, Bed to Chair, Banker, Toilet     Locomotion: PT Ambulation, Stairs     Additional Impairments: OT    SLP Swallowing, Social Cognition   Social Interaction, Awareness, Attention, Memory, Problem Solving  TR      Anticipated Outcomes Item Anticipated Outcome  Self Feeding S  Swallowing  supervision A with regular solids, thin liquids   Basic self-care  S  Toileting  S   Bathroom Transfers S  Bowel/Bladder  Min assist  Transfers  supervision  Locomotion  supervision  Communication  minimal A basic level expression/comprehension  Cognition  mod A basic level problem solving, safety awareness  Pain  <4 on a 0-10 pain scale  Safety/Judgment  min assist   Therapy Plan: PT Intensity: Minimum of 1-2 x/day ,45 to 90 minutes PT Frequency: 5 out of 7 days PT Duration Estimated Length of Stay: 10-12 days OT Intensity: Minimum of 1-2 x/day, 45 to 90 minutes OT Frequency: 5 out of 7 days OT Duration/Estimated Length of Stay: 10-12 SLP Intensity: Minumum of 1-2 x/day, 30 to 90 minutes SLP Frequency: 3 to 5 out of 7 days SLP Duration/Estimated Length of Stay: 10-12 days   Due to the current state of emergency, patients may not be receiving their 3-hours of Medicare-mandated therapy.   Team  Interventions: Nursing Interventions Patient/Family Education, Bladder Management, Bowel Management, Disease Management/Prevention, Pain Management, Medication Management, Skin Care/Wound Management, Cognitive Remediation/Compensation, Discharge Planning, Psychosocial Support  PT interventions Ambulation/gait training, Community reintegration, DME/adaptive equipment instruction, Neuromuscular re-education, Psychosocial support, Stair training, UE/LE Strength taining/ROM, Warden/ranger, Functional electrical stimulation, Pain management, Therapeutic Activities, UE/LE Coordination activities, Cognitive remediation/compensation, Disease management/prevention, Functional mobility training, Patient/family education, Splinting/orthotics, Visual/perceptual remediation/compensation, Discharge planning  OT Interventions Balance/vestibular training, Discharge planning, Pain management, Self Care/advanced ADL retraining, Therapeutic Activities, UE/LE Coordination activities, Cognitive remediation/compensation, Disease mangement/prevention, Functional mobility training, Patient/family education, Skin care/wound managment, Therapeutic Exercise, Visual/perceptual remediation/compensation, UE/LE Strength taining/ROM, Splinting/orthotics, Psychosocial support, Neuromuscular re-education, DME/adaptive equipment instruction, Community reintegration, Wheelchair propulsion/positioning  SLP Interventions Cognitive remediation/compensation, Functional tasks, Equities trader education, Environmental controls, Financial trader, Internal/external aids, Speech/Language facilitation, Therapeutic Activities  TR Interventions    SW/CM Interventions Psychosocial Support, Patient/Family Education, Discharge Planning   Barriers to Discharge MD  Medical stability, Home enviroment access/loayout, Incontinence, Lack of/limited family support, Medication compliance, Behavior and worsening confusion after DBS  Nursing Decreased  caregiver support, Home environment access/layout, Incontinence, Wound Care, Lack of/limited family support, Nutrition means    PT Behavior    OT      SLP Other (comments) reported recent decline in mobility and cognition prior to admission  SW Decreased caregiver support, Lack of/limited family support     Team Discharge Planning: Destination: PT-Home ,OT- Home , SLP-  Projected Follow-up: PT-Outpatient PT, 24 hour supervision/assistance, OT-  Home health OT, SLP-24 hour supervision/assistance, Home Health SLP,  Skilled Nursing facility Projected Equipment Needs: PT-To be determined, OT- To be determined, SLP-None recommended by SLP Equipment Details: PT- , OT-[facility] to get pt shower seat and grab bars Patient/family involved in discharge planning: PT- Patient,  OT-Patient, Family member/caregiver, SLP-Patient unable/family or caregive not available  MD ELOS: 10-12 days Medical Rehab Prognosis:  Fair Assessment: Pt is a 74 yr old male with significant parkinson's disease    See Team Conference Notes for weekly updates to the plan of care s/p recent DBS placement 7/27- due to get staples out today- also on chronic oxycodone- will reduce to TID to improve mentation, and requiring a Posey bed due to some mild agitation but was constantly getting OOB and at risk for falls- >5x the first night- tolerating bed well.   Goals are min A to supervision. Wife is caretaker.

## 2020-06-23 NOTE — Plan of Care (Signed)
  Problem: Consults °Goal: RH GENERAL PATIENT EDUCATION °Description: See Patient Education module for education specifics. °Outcome: Progressing °Goal: Skin Care Protocol Initiated - if Braden Score 18 or less °Description: If consults are not indicated, leave blank or document N/A °Outcome: Progressing °Goal: Nutrition Consult-if indicated °Outcome: Progressing °  °Problem: RH BOWEL ELIMINATION °Goal: RH STG MANAGE BOWEL WITH ASSISTANCE °Description: STG Manage Bowel with min Assistance. °Outcome: Progressing °Goal: RH STG MANAGE BOWEL W/MEDICATION W/ASSISTANCE °Description: STG Manage Bowel with Medication with min Assistance. °Outcome: Progressing °  °Problem: RH BLADDER ELIMINATION °Goal: RH STG MANAGE BLADDER WITH ASSISTANCE °Description: STG Manage Bladder With min Assistance °Outcome: Progressing °  °Problem: RH SKIN INTEGRITY °Goal: RH STG SKIN FREE OF INFECTION/BREAKDOWN °Description: Skin to remain free of breakdown while on rehab with min assist. °Outcome: Progressing °Goal: RH STG MAINTAIN SKIN INTEGRITY WITH ASSISTANCE °Description: STG Maintain Skin Integrity With min Assistance. °Outcome: Progressing °Goal: RH STG ABLE TO PERFORM INCISION/WOUND CARE W/ASSISTANCE °Description: STG Able To Perform Incision/Wound Care With min Assistance. °Outcome: Progressing °  °Problem: RH SAFETY °Goal: RH STG ADHERE TO SAFETY PRECAUTIONS W/ASSISTANCE/DEVICE °Description: STG Adhere to Safety Precautions With min Assistance and appropriate assistive Device. °Outcome: Progressing °  °Problem: RH PAIN MANAGEMENT °Goal: RH STG PAIN MANAGED AT OR BELOW PT'S PAIN GOAL °Description: <4 on a 0-10 pain scale °Outcome: Progressing °  °Problem: RH KNOWLEDGE DEFICIT GENERAL °Goal: RH STG INCREASE KNOWLEDGE OF SELF CARE AFTER HOSPITALIZATION °Description: Patient and caregiver will be able to demonstrate knowledge of medication management, skin care, mobility safety, and follow up care with the MD post discharge from CIR with  min assist from staff. °Outcome: Progressing °  °

## 2020-06-23 NOTE — Progress Notes (Signed)
Occupational Therapy Session Note  Patient Details  Name: Reginald Jenkins MRN: [id number] Date of Birth: [birth year]  Today's Date: 06/23/2020  Session 1 OT Individual Time: 6010-9323 OT Individual Time Calculation (min): 29 min   Session 2 OT Individual Time: 1015-1100 OT Individual Time Calculation (min): 45 min   Short Term Goals: Week 1:  OT Short Term Goal 1 (Week 1): Pt will transfer to toilet wiht CGA and LRAD OT Short Term Goal 2 (Week 1): Pt will bathe body parts with MOD VC to demo improved sequencing OT Short Term Goal 3 (Week 1): Pt will don shirt with S OT Short Term Goal 4 (Week 1): Pt will don LB clothing wiht min VC for threading BLE seated and CGA for balance  Skilled Therapeutic Interventions/Progress Updates:  Session 1   Pt greeted in enclosure bed with spouse present. Per wife, pt had a very restless night. Pt easy to wake and agreeable to OT. Pt followed commands to come to sitting EOB with min A to advance LEs and Min A to elevate trunk. Worked on donning pants at EOB with pt needing mod A to thread pant legs and hand over hand A at times to grasp pants as pt under reaching for objects and pillrolling in R UE. Sit<>stand with Min A, then pt able to pull up pants, but needed min A for balance. 2/2 posterior lean. Pt then ambulated to the sink with mod HHA. Worked on toothbrushing task in standing with pt having difficulty with bilateral UE task requiring OT assist to place toothpaste. Pt returned to enclosure bed in similar fashion.   Session 2 Pt greeted semi-reclined in enclosure bed and agreeable to OT treatment session. Pt came to sitting EOB with mod A, the min A for sit<>stand and pivot over to wc. Pt brought to the sink in wc to shave with electric razor. Pt able to shave using R hand with OT guided A to move razor to different parts of face. Pt brought to therapy gym and worked on B UE task using UE Ergometer on level 3 for more resistance and to encourage bigger  gross movement through B UEs. Pt needed OT assist to maintain grip intermittently on L and R hands, but did better with larger movement of BUE task. Pt returned to room and pivoted back to enclosure bed with min A.    Therapy Documentation Precautions:  Precautions Precautions: Fall Precaution Comments: wife denies fall history  Restrictions Weight Bearing Restrictions: No Pain: Pain Assessment Pain Scale: Faces Faces Pain Scale: No hurt  Therapy/Group: Individual Therapy  Mal Amabile 06/23/2020, 11:09 AM

## 2020-06-24 ENCOUNTER — Inpatient Hospital Stay (HOSPITAL_COMMUNITY): Payer: Medicare Other | Admitting: Speech Pathology

## 2020-06-24 ENCOUNTER — Inpatient Hospital Stay (HOSPITAL_COMMUNITY): Payer: Medicare Other | Admitting: Physical Therapy

## 2020-06-24 ENCOUNTER — Inpatient Hospital Stay (HOSPITAL_COMMUNITY): Payer: Medicare Other | Admitting: Occupational Therapy

## 2020-06-24 NOTE — Plan of Care (Signed)
  Problem: Consults °Goal: RH GENERAL PATIENT EDUCATION °Description: See Patient Education module for education specifics. °Outcome: Progressing °Goal: Skin Care Protocol Initiated - if Braden Score 18 or less °Description: If consults are not indicated, leave blank or document N/A °Outcome: Progressing °Goal: Nutrition Consult-if indicated °Outcome: Progressing °  °Problem: RH BOWEL ELIMINATION °Goal: RH STG MANAGE BOWEL WITH ASSISTANCE °Description: STG Manage Bowel with min Assistance. °Outcome: Progressing °Goal: RH STG MANAGE BOWEL W/MEDICATION W/ASSISTANCE °Description: STG Manage Bowel with Medication with min Assistance. °Outcome: Progressing °  °Problem: RH BLADDER ELIMINATION °Goal: RH STG MANAGE BLADDER WITH ASSISTANCE °Description: STG Manage Bladder With min Assistance °Outcome: Progressing °  °Problem: RH SKIN INTEGRITY °Goal: RH STG SKIN FREE OF INFECTION/BREAKDOWN °Description: Skin to remain free of breakdown while on rehab with min assist. °Outcome: Progressing °Goal: RH STG MAINTAIN SKIN INTEGRITY WITH ASSISTANCE °Description: STG Maintain Skin Integrity With min Assistance. °Outcome: Progressing °Goal: RH STG ABLE TO PERFORM INCISION/WOUND CARE W/ASSISTANCE °Description: STG Able To Perform Incision/Wound Care With min Assistance. °Outcome: Progressing °  °Problem: RH SAFETY °Goal: RH STG ADHERE TO SAFETY PRECAUTIONS W/ASSISTANCE/DEVICE °Description: STG Adhere to Safety Precautions With min Assistance and appropriate assistive Device. °Outcome: Progressing °  °Problem: RH PAIN MANAGEMENT °Goal: RH STG PAIN MANAGED AT OR BELOW PT'S PAIN GOAL °Description: <4 on a 0-10 pain scale °Outcome: Progressing °  °Problem: RH KNOWLEDGE DEFICIT GENERAL °Goal: RH STG INCREASE KNOWLEDGE OF SELF CARE AFTER HOSPITALIZATION °Description: Patient and caregiver will be able to demonstrate knowledge of medication management, skin care, mobility safety, and follow up care with the MD post discharge from CIR with  min assist from staff. °Outcome: Progressing °  °

## 2020-06-24 NOTE — Progress Notes (Signed)
Speech Language Pathology Daily Session Note  Patient Details  Name: Reginald Jenkins MRN: [id number] Date of Birth: Aug 30, 1946  Today's Date: 06/24/2020 SLP Individual Time: 7939-0300 SLP Individual Time Calculation (min): 55 min  Short Term Goals: Week 1: SLP Short Term Goal 1 (Week 1): Patient will attend to basic functional tasks with moderate frequency of cues to redirect. SLP Short Term Goal 2 (Week 1): Patient will complete basic level functional problem solving tasks with moderate A and cues. SLP Short Term Goal 3 (Week 1): Patient will safely consume least restrictive oral diet at setup and supervision A. SLP Short Term Goal 4 (Week 1): Patient will utilize visual aids for orientation to place, time, situation with moderate A cues. SLP Short Term Goal 5 (Week 1): Patient will initiate verbal requests for specific wants/needs with min A cues.  Skilled Therapeutic Interventions: Skilled treatment session focused on cognitive goals. SLP facilitated session by providing extra time and Mod-Max A verbal and visual cues for sorting coins from a field of four. Patient required total A to generate specific amounts of change but only Mod-Max A verbal cues were needed to generate specific amounts of coins (1 quarter and 1 penny). Patient also demonstrated improved ability to count change when generated in this way. Patient with intermittent language of confusion throughout session but also initiated conversations about personal relevant information. [name] wife present and confirmed accuracy of information. Patient left upright in the wheelchair with alarm on and wife present. Continue with current plan of care.      Pain No/Denies Pain   Therapy/Group: Individual Therapy  Cadi Rhinehart 06/24/2020, 12:16 PM

## 2020-06-24 NOTE — Progress Notes (Signed)
Vitals not taken for this patient d/t agitation/ sun downing. Patient was pulling the dinamap machine and grabbing pulse oximeter. Seroquel and ambien given prior prn. Per wife, patient does not need oxycodone for now. Patient fell asleep around 2300.

## 2020-06-24 NOTE — Progress Notes (Signed)
Occupational Therapy Session Note  Patient Details  Name: Reginald Jenkins MRN: [id number] Date of Birth: 10/07/1946  Today's Date: 06/24/2020 OT Individual Time: 0732-0830 OT Individual Time Calculation (min): 58 min    Short Term Goals: Week 1:  OT Short Term Goal 1 (Week 1): Pt will transfer to toilet wiht CGA and LRAD OT Short Term Goal 2 (Week 1): Pt will bathe body parts with MOD VC to demo improved sequencing OT Short Term Goal 3 (Week 1): Pt will don shirt with S OT Short Term Goal 4 (Week 1): Pt will don LB clothing wiht min VC for threading BLE seated and CGA for balance  Skilled Therapeutic Interventions/Progress Updates:    Pt greeted seated EOB with wife and nursing present. Pt agreeable to shower. Pt's spouse reported pt had a much more restful night last night. Pt completed sit<>stand from EOB with min A and posterior lean requiring OT assist to correct. Pt needed min HHA to ambulate into bathroom and transfer onto commode. Pt with successful void of bladder and had BM. Worked on pt completing hygiene s/p BM in standing with pt needing min A for balance, but then he was able to cleanse bottom. Pt then ambulated to shower in similar fashion. Pt able to complete bathing tasks but needed verbal cues to move on to next body part. Pt ambulated out of bathroom in similar fashion and worked on dressing strategies with pt needing min A overall and verbal cues for orientation of clothing. Increased time for all BADLs. Pt with less tremor noted today during BADLs. Pt left seated in wc at end of session with spouse present, alarm belt on, and needs met.   Therapy Documentation Precautions:  Precautions Precautions: Fall Precaution Comments: wife denies fall history  Restrictions Weight Bearing Restrictions: No Pain: Pain Assessment Pain Scale: 0-10 Pain Score: 0-No pain   Therapy/Group: Individual Therapy   S  06/24/2020, 8:34 AM  

## 2020-06-24 NOTE — Progress Notes (Signed)
Physical Therapy Session Note  Patient Details  Name: Reginald Jenkins MRN: [id number] Date of Birth: Aug 15, 1946  Today's Date: 06/24/2020 PT Individual Time: 4627-0350 and 0938-1829 PT Individual Time Calculation (min): 30 min  And 50 min  Short Term Goals: Week 1:  PT Short Term Goal 1 (Week 1): Pt will perform bed mobility with CGA PT Short Term Goal 2 (Week 1): Pt will perform bed to chair transfer with CGA PT Short Term Goal 3 (Week 1): Pt will ambulate 150' with CGA and LRAD  Skilled Therapeutic Interventions/Progress Updates:    Session 1: Pt received sitting in w/c with his wife and nursing staff present. Pt agreeable to therapy session. Sit<>stands with CGA for steadying and intermittent R HHA throughout session. Gait training ~243ft with R HHA and min assist for balance - focused on following directions with pt demoing difficulty determining R/L with increased difficulty visually scanning and attending to items on R side - unable to follow 2-step command/directions. B UE and B LE reciprocal movement pattern retraining using Nustep against level 5 resistance for totaling 276steps - cuing throughout to attend to amount of time to reach goal of 5 minutes but pt unable to follow those directions. Gait training ~169ft to main therapy gym with R HHA to facilitate arm swing and CGA/min assist for steadying/balance - continuing to work on following directions to locate gym. Ascended/descended 4 steps x2 with pt unable to locate the stairs grabbing onto L handrail with R hand and standing off to the L side of the stairs - requires hand-over-hand assist to locate R handrail with R hand and face steps - self-selected step-to pattern in both directions leading with R LE on ascent and descent - cuing to progress to reciprocal stepping on ascent. Gait training ~156ft back to room without UE support progressed to R arm swing facilitation which resulted in improved symmetry of steps with increased  step lengths. Pt left seated in w/c with needs in reach, his wife present, and seat belt alarm on.  Session 2: Pt received sitting in w/c with his wife present and pt agreeable to therapy session. Pt continues to demonstrate fidgeting via unlocking/locking brakes and picking at seat belt alarm. Sit<>stands with CGA for steadying throughout session. Pt instructed to walk up to nurses station and ask for a mask - able to locate nurses station but demonstrates expressive aphasia with easy distractibility as pt unable to recall what he was trying to ask for but rather repeated a phrase he heard a nurse nearby say or had "word salad" - required max cuing to recall what he was asking for. Gait training ~193ft to main therapy gym, no UE support, with CGA for steadying - pt demos improved ability to follow directions to locate therapy gym. Pt requires cuing throughout session to remain seated on mat while therapist retrieved items otherwise he would stand up and follow therapist demonstrating impaired awareness. Attempted dynamic standing balance of alternate B LE foot taps on external target with contralateral arm raises to same colored external target - despite max multimodal cuing and manual facilitation pt unable to motor plan and process understanding of the task. Attempted PEG board pattern but pt unable to understand goal of replicating pattern in picture on the board with pt attempting to place PEGs on the photo and randomly placing them on board.   Dynamic gait tasks of: -  R/L lateral side stepping ~49ft each with min assist for balance and manual facilitation for  hip alignment - cuing for increased step length - backwards walking ~64ft x4 with min assist for balance - cuing for increased step length and slower, controlled stepping - progressed to sudden stopping to increase focus on controlled backwards walking   Dynamic standing balance and dynamic gait task of playing horseshoes - after increased  repetitions pt able to recall more rules of the game but due to significant aphasia pt unable to clarify the rules verbally - had pt perform dual-task challenge of keeping score - required CGA/min assist for this task including picking up horseshoes from ground. Gait training ~177ft back to room, no UE support, with CGA for steadying - mod cuing to locate room. Pt left seated in w/c with his wife present, seat belt alarm on, and needs in reach.  Therapy Documentation Precautions:  Precautions Precautions: Fall Precaution Comments: wife denies fall history  Restrictions Weight Bearing Restrictions: No  Pain: Session 1: No reports of pain throughout session.  Session 2: No reports of pain throughout session.   Therapy/Group: Individual Therapy  Ginny Forth , PT, DPT, CSRS  06/24/2020, 3:44 PM

## 2020-06-24 NOTE — Progress Notes (Signed)
[facility] PHYSICAL MEDICINE & REHABILITATION PROGRESS NOTE   Subjective/Complaints: Pain well controlled.  Wife stays with him at night. Denies constipation.  Slept well last night  ROS:  limited by cognition   Objective:   No results found. Recent Labs    06/22/20 0641  WBC 5.3  HGB 12.2*  HCT 36.7*  PLT 285   Recent Labs    06/22/20 0641  NA 137  K 4.0  CL 100  CO2 26  GLUCOSE 132*  BUN 13  CREATININE 0.74  CALCIUM 9.4    Intake/Output Summary (Last 24 hours) at 06/24/2020 1543 Last data filed at 06/24/2020 1300 Gross per 24 hour  Intake --  Output 100 ml  Net -100 ml     Physical Exam: Vital Signs Blood pressure 116/69, pulse 92, temperature 98.4 F (36.9 C), temperature source Oral, resp. rate 15, height 5\' 7"  (1.702 m), weight 77.1 kg, SpO2 100 %.  General: Alert, No apparent distress HEENT: PERRLA, EOMI, sclera anicteric, oral mucosa pink and moist, dentition intact, ext ear canals clear,  Neck: Supple without JVD or lymphadenopathy Heart: Reg rate and rhythm. No murmurs rubs or gallops Chest: CTA bilaterally without wheezes, rales, or rhonchi; no distress Abdomen: Soft, non-tender, non-distended, bowel sounds positive. Extremities: No clubbing, cyanosis, or edema. Pulses are 2+ Skin: staples removed Neurological: Pt Ox1 only - no change  Mild dysarthria  He does have a resting tremor esp in RUE- fast tremor Motor: Grossly 5/5 throughout Psychiatric:  Comments: Limited due to mentation    Assessment/Plan: 1. 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  Physiatrist is providing close team supervision and 24 hour management of active medical problems listed below.  Physiatrist and rehab team continue to assess barriers to discharge/monitor patient progress toward  functional and medical goals  Care Tool:  Bathing    Body parts bathed by patient: Chest, Abdomen, Front perineal area, Face   Body parts bathed by helper: Right arm, Left arm, Chest, Abdomen, Front perineal area, Buttocks, Right upper leg, Right lower leg, Left upper leg, Left lower leg, Face     Bathing assist Assist Level: Total Assistance - Patient < 25%     Upper Body Dressing/Undressing Upper body dressing   What is the patient wearing?: Hospital gown only    Upper body assist Assist Level: Maximal Assistance - Patient 25 - 49%    Lower Body Dressing/Undressing Lower body dressing      What is the patient wearing?: Incontinence brief     Lower body assist Assist for lower body dressing: Maximal Assistance - Patient 25 - 49%     Toileting Toileting    Toileting assist Assist for toileting: 2 Helpers (pt confused)     Transfers Chair/bed transfer  Transfers assist     Chair/bed transfer assist level: Minimal Assistance - Patient > 75% Chair/bed transfer assistive device:   Ambulation assist      Assist level: Minimal Assistance - Patient > 75% Assistive device: No Device Max distance: 200'   Walk 10 feet activity   Assist     Assist level: Minimal Assistance - Patient > 75% Assistive device: No Device   Walk 50 feet activity   Assist    Assist level: Minimal Assistance - Patient > 75% Assistive device: No Device    Walk 150 feet activity  Assist    Assist level: Minimal Assistance - Patient > 75% Assistive device: No Device    Walk 10 feet on uneven surface  activity   Assist     Assist level: Minimal Assistance - Patient > 75%     Wheelchair     Assist Will patient use wheelchair at discharge?: No             Wheelchair 50 feet with 2 turns activity    Assist            Wheelchair 150 feet activity     Assist          Blood pressure 116/69, pulse 92,  temperature 98.4 F (36.9 C), temperature source Oral, resp. rate 15, height 5\' 7"  (1.702 m), weight 77.1 kg, SpO2 100 %.  Medical Problem List and Plan: 1.Decreased functional mobilitysecondary to Parkinson's disease [facility] -patientmay notshower  8/13- will d/c staples- and then pt can shower -ELOS/Goals: 6-9 days/Mod I/supervision Continue CIR 2. Antithrombotics:SCDs -DVT/anticoagulation:  -antiplatelet therapy: Aspirin 81 mg daily 3. Pain Management/chronicbackpain:Currently on home regimen of oxycodone 15 mg 4 times daily, Flexeril 10 mg 3 times daily as needed. Monitor mental status  8/12- c/o no pain this AM- nursing went in after I did to give AM meds.   8/13- will decrease to TID per wife's request- if tolerates well, Monday will decrease to BID  8/14: well controlled Monitor with increased exertion. 4. Mood:Ambien as prior to admission decreased to 5 mg nightly -antipsychotic agents: Seroquel 12.5 mg nightly as needed  8/12- Posey bed ordered by NP overnight last night- pt kept getting out of bed- >3x overnight.  5. Neuropsych: This patientisNOTcapable of making decisions on hisown behalf. 6. Skin/Wound Care:Routine skin checks  8/14: staples removed yesterday, healing well 7. Fluids/Electrolytes/Nutrition:Routine in and outs. CMP ordered for tomorrow a.m. 8.Parkinson's disease. Continue Sinemet 25-100 mg 4 times daily as well as Artane 2 mg 3 times daily 9. Hypertension. Lisinopril 5 mg daily.  Monitor with increased mobility. Well controlled 10. B12 deficiency. Complete 7-daycourse of vitamin B12 1000 mcg daily x7 days 11. Hypothyroidism. Continue Synthroid    LOS: 3 days A FACE TO FACE EVALUATION WAS PERFORMED  9/14 06/24/2020, 3:43 PM

## 2020-06-25 NOTE — Progress Notes (Signed)
Pt very agitated and trying to get out of the bed. Per wife pt was taking 12.5mg  prior to admission, but pt only has order for 7.5mg . Wife is at bedside trying to keep th pt calm and in the bed at this time. On call provider will be contacted for further orders.

## 2020-06-25 NOTE — Progress Notes (Signed)
[place] PHYSICAL MEDICINE & REHABILITATION PROGRESS NOTE   Subjective/Complaints: Pain well controlled. Slept poorly at bed despite wife being with him. Wife is tearful- states he was very agitated and does not like enclosure bed. Requests that it be discontinued. Discussed risks of falls secondary to his impulsivity and she is understanding  ROS: limited by cognition   Objective:   No results found. No results for input(s): WBC, HGB, HCT, PLT in the last 72 hours. No results for input(s): NA, K, CL, CO2, GLUCOSE, BUN, CREATININE, CALCIUM in the last 72 hours.  Intake/Output Summary (Last 24 hours) at 06/25/2020 1416 Last data filed at 06/25/2020 0700 Gross per 24 hour  Intake 118 ml  Output --  Net 118 ml     Physical Exam: Vital Signs Blood pressure 100/65, pulse 83, temperature 98.2 F (36.8 C), temperature source Oral, resp. rate 16, height 5\' 7"  (1.702 m), weight 77.1 kg, SpO2 98 %.   General: Alert and oriented, No apparent distress HEENT: scalp incisions healing well, ext ear canals clear,  Neck: Supple without JVD or lymphadenopathy Heart: Reg rate and rhythm. No murmurs rubs or gallops Chest: CTA bilaterally without wheezes, rales, or rhonchi; no distress Abdomen: Soft, non-tender, non-distended, bowel sounds positive. Extremities: No clubbing, cyanosis, or edema. Pulses are 2+ Skin: scalp incisions healing well Neurological: Pt Ox1 only - no change  Mild dysarthria  He does have a resting tremor esp in RUE- fast tremor Motor: Grossly 5/5 throughout Psychiatric:  Comments: Limited due to mentation    Assessment/Plan: 1. 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  Physiatrist is providing close team supervision and 24 hour management of active medical problems listed  below.  Physiatrist and rehab team continue to assess barriers to discharge/monitor patient progress toward functional and medical goals  Care Tool:  Bathing    Body parts bathed by patient: Chest, Abdomen, Front perineal area, Face   Body parts bathed by helper: Right arm, Left arm, Chest, Abdomen, Front perineal area, Buttocks, Right upper leg, Right lower leg, Left upper leg, Left lower leg, Face     Bathing assist Assist Level: Total Assistance - Patient < 25%     Upper Body Dressing/Undressing Upper body dressing   What is the patient wearing?: Pull over shirt    Upper body assist Assist Level: Maximal Assistance - Patient 25 - 49%    Lower Body Dressing/Undressing Lower body dressing      What is the patient wearing?: Incontinence brief     Lower body assist Assist for lower body dressing: Maximal Assistance - Patient 25 - 49%     Toileting Toileting    Toileting assist Assist for toileting: 2 Helpers (pt confused)     Transfers Chair/bed transfer  Transfers assist     Chair/bed transfer assist level: Contact Guard/Touching assist Chair/bed transfer assistive device:   Ambulation assist      Assist level: Contact Guard/Touching assist Assistive device: No Device Max distance: 176ft   Walk 10 feet activity   Assist     Assist level: Contact Guard/Touching assist Assistive device: No Device   Walk 50 feet activity   Assist    Assist level: Contact Guard/Touching assist Assistive device: No Device    Walk 150 feet activity   Assist    Assist level: Contact Guard/Touching assist Assistive device:  No Device    Walk 10 feet on uneven surface  activity   Assist     Assist level: Minimal Assistance - Patient > 75%     Wheelchair     Assist Will patient use wheelchair at discharge?: No             Wheelchair 50 feet with 2 turns activity    Assist            Wheelchair  150 feet activity     Assist          Blood pressure 100/65, pulse 83, temperature 98.2 F (36.8 C), temperature source Oral, resp. rate 16, height 5\' 7"  (1.702 m), weight 77.1 kg, SpO2 98 %.  Medical Problem List and Plan: 1.Decreased functional mobilitysecondary to Parkinson's disease [facility]/delir[facility] -patientmay notshower  8/13- will d/c staples- and then pt can shower -ELOS/Goals: 6-9 days/Mod I/supervision Continue CIR 2. Antithrombotics:SCDs -DVT/anticoagulation:  -antiplatelet therapy: Aspirin 81 mg daily 3. Pain Management/chronicbackpain:Currently on home regimen of oxycodone 15 mg 4 times daily, Flexeril 10 mg 3 times daily as needed. Monitor mental status  8/12- c/o no pain this AM- nursing went in after I did to give AM meds.   8/13- will decrease to TID per wife's request- if tolerates well, Monday will decrease to BID  8/15: well controlled Monitor with increased exertion. 4. Mood:Ambien as prior to admission decreased to 5 mg nightly -antipsychotic agents: Seroquel 12.5 mg nightly as needed  8/15: Posey bed discontinued as worsens patient's agitation. Wife understanding of risks.   5. Neuropsych: This patientisNOTcapable of making decisions on hisown behalf. 6. Skin/Wound Care:Routine skin checks  Staples removed 8/13, healing well 7. Fluids/Electrolytes/Nutrition:Routine in and outs. CMP ordered for tomorrow a.m. 8.Parkinson's disease. Continue Sinemet 25-100 mg 4 times daily as well as Artane 2 mg 3 times daily 9. Hypertension. Lisinopril 5 mg daily.  Monitor with increased mobility. Well controlled 10. B12 deficiency. Complete 7-daycourse of vitamin B12 1000 mcg daily x7 days 11. Hypothyroidism. Continue Synthroid    LOS: 4 days A FACE TO FACE EVALUATION  WAS PERFORMED  9/13 P Javon Hupfer 06/25/2020, 2:16 PM

## 2020-06-26 ENCOUNTER — Inpatient Hospital Stay (HOSPITAL_COMMUNITY): Payer: Medicare Other | Admitting: Occupational Therapy

## 2020-06-26 ENCOUNTER — Inpatient Hospital Stay (HOSPITAL_COMMUNITY): Payer: Medicare Other

## 2020-06-26 MED ORDER — ZOLPIDEM TARTRATE 5 MG PO TABS: 12.5000 mg | ORAL_TABLET | Freq: Once | ORAL | Status: DC

## 2020-06-26 MED ORDER — OXYCODONE HCL 5 MG PO TABS
15.0000 mg | ORAL_TABLET | Freq: Two times a day (BID) | ORAL | Status: DC
  Filled 2020-06-26 (×2): qty 3

## 2020-06-26 MED ORDER — ZOLPIDEM TARTRATE 5 MG PO TABS
10.0000 mg | ORAL_TABLET | Freq: Once | ORAL | Status: AC
  Administered 2020-06-26: 10 mg via ORAL
  Filled 2020-06-26: qty 2

## 2020-06-26 NOTE — Progress Notes (Signed)
Spoke to [place], [place] about wifes concern for increased sleep medication due to pts agitation and not effective medicine given last night. Prior to admission, pt was taken Ambien 12.5 mg that was effective. Dan, PA gave  Verbal order for 1 time dose of 12.5 Ambien for sleep.

## 2020-06-26 NOTE — Progress Notes (Signed)
Occupational Therapy Session Note  Patient Details  Name: Reginald Jenkins MRN: [id number] Date of Birth: Apr 22, 1946  Today's Date: 06/26/2020 OT Individual Time: 4098-1191 OT Individual Time Calculation (min): 59 min    Short Term Goals: Week 1:  OT Short Term Goal 1 (Week 1): Pt will transfer to toilet wiht CGA and LRAD OT Short Term Goal 2 (Week 1): Pt will bathe body parts with MOD VC to demo improved sequencing OT Short Term Goal 3 (Week 1): Pt will don shirt with S OT Short Term Goal 4 (Week 1): Pt will don LB clothing wiht min VC for threading BLE seated and CGA for balance   Skilled Therapeutic Interventions/Progress Updates:    Pt greeted at time of session sitting up in recliner with wife present, agreeable to OT session. Pt presented with confusion/incoherent speech throughout session and required cues to be redirected to tasks and sequence/initiate. SPT from recliner to wheelchair with Min A, hand held assist. Discussed AE/weighted utensils and techniques to improve self feeding/drinking. Brought via wheelchair to gym for time management and performed SCIFIT in standing for 5 mins forward/backward for NMR of BUEs and provide proprioceptive input. Dynavision in standing with unilateral hand held assist with hand over hand assist with other hand to initiate task of hitting red buttons, once pt had performed 5-10 with therapist assist pt was able to take over without hand over hand. On second attempt, pt became very distracted, determined to have had a BM. Brought to bathroom via wheelchair and SPT to toilet with CGA/Min and Min A to doff clothing, Mod A to perform hygiene to clean up BM, and Min/Mod to don new brief as underwear with pt able to thread but assist to don over hips. Hand off to therapist for next session to finish with toileting and donning pants.   Therapy Documentation Precautions:  Precautions Precautions: Fall Precaution Comments: wife denies fall history   Restrictions Weight Bearing Restrictions: No    Therapy/Group: Individual Therapy  Erasmo Score 06/26/2020, 12:55 PM

## 2020-06-26 NOTE — Progress Notes (Signed)
Physical Therapy Session Note  Patient Details  Name: Reginald Jenkins MRN: [id number] Date of Birth: 07-12-1946  Today's Date: 06/26/2020 PT Individual Time: 1002-1043 PT Individual Time Calculation (min): 41 min   Short Term Goals: Week 1:  PT Short Term Goal 1 (Week 1): Pt will perform bed mobility with CGA PT Short Term Goal 2 (Week 1): Pt will perform bed to chair transfer with CGA PT Short Term Goal 3 (Week 1): Pt will ambulate 150' with CGA and LRAD  Skilled Therapeutic Interventions/Progress Updates:     Pt received seated in recliner with wife present. Agreeable to therapy and reports no pain. Pt appears to be more alert today, correctly identifying wife and oriented to time and place. Pt does have fluctuating cognition, however, and appears to hallucinate some during session. Pt also claims to see brother "Gala Romney" in hallway.  Sit to stand with CGA. Pt ambulates bouts of 250', and 150' during session with minA/CGA at hips for pelvic rotation as well as stability.  Pt performs dynamic balance activities. Initially pt stands and tosses ball at trampoline and catches rebound.  1x20 forward 1x20 overhead 1x20 facing left 1x20 facing right PT provides minA at hips to encourage rotation and prevent posterior LOB due to slight posterior lean.    Pt then stands and tosses horseshoes with RUE and steps forward with LLE. Pt retrieves horeseshoes after tossing them, with PT providing minA at hips for safety and pt bending forward to pick horseshoes off ground.  Pt left seated in recliner with alarm intact and all needs within reach.  Therapy Documentation Precautions:  Precautions Precautions: Fall Precaution Comments: wife denies fall history  Restrictions Weight Bearing Restrictions: No    Therapy/Group: Individual Therapy  Beau Fanny, PT, DPT 06/26/2020, 11:18 AM

## 2020-06-26 NOTE — Progress Notes (Signed)
[place] PHYSICAL MEDICINE & REHABILITATION PROGRESS NOTE   Subjective/Complaints:  Pt reports no pain- Posey bed was d'c;d yesterday- she reports he slept better and was less agitated overnight.   Slept 2-3 hours, woke up, then back to sleep after more meds for 2-3 hours.   DBS not turned on yet- will be at f/u appt.   LBM yesterday and 3 BMs 2 days ago. Decreased the bowel meds themselves and plan if no BM today, to increase them again.   _Per nursing note, pt constantly trying to get out of bed- only because wife at beside, could nursing keep him in bed.      ROS: Limited by cognition   Objective:   No results found. No results for input(s): WBC, HGB, HCT, PLT in the last 72 hours. No results for input(s): NA, K, CL, CO2, GLUCOSE, BUN, CREATININE, CALCIUM in the last 72 hours.  Intake/Output Summary (Last 24 hours) at 06/26/2020 1556 Last data filed at 06/26/2020 1300 Gross per 24 hour  Intake 538 ml  Output 450 ml  Net 88 ml     Physical Exam: Vital Signs Blood pressure 113/68, pulse 89, temperature 98.5 F (36.9 C), resp. rate 16, height 5\' 7"  (1.702 m), weight 77.1 kg, SpO2 100 %.   Gen: awake, Ox1; sitting on bed- trying ot stand with OT at side, wife in room, NAD HENT: healing incisions on head- staples out CV: RRR Resp: CTA B/L- no W/R/R- good air movement GI: Soft, NT, ND, (+)BS  Neurological: Pt Ox1 only - no change- constantly trying to stand from side of bed- not in posey bed Mild dysarthria  He does have a resting tremor esp in RUE- fast tremor- constantly Motor: Grossly 5/5 throughout Psychiatric:  Comments: flat affect    Assessment/Plan: 1. 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  Physiatrist is providing close team supervision and 24 hour management of active  medical problems listed below.  Physiatrist and rehab team continue to assess barriers to discharge/monitor patient progress toward functional and medical goals  Care Tool:  Bathing    Body parts bathed by patient: Chest, Abdomen, Front perineal area, Face   Body parts bathed by helper: Right arm, Left arm, Chest, Abdomen, Front perineal area, Buttocks, Right upper leg, Right lower leg, Left upper leg, Left lower leg, Face     Bathing assist Assist Level: Total Assistance - Patient < 25%     Upper Body Dressing/Undressing Upper body dressing   What is the patient wearing?: Pull over shirt    Upper body assist Assist Level: Maximal Assistance - Patient 25 - 49%    Lower Body Dressing/Undressing Lower body dressing      What is the patient wearing?: Incontinence brief     Lower body assist Assist for lower body dressing: Moderate Assistance - Patient 50 - 74%     Toileting Toileting    Toileting assist Assist for toileting: Moderate Assistance - Patient 50 - 74%     Transfers Chair/bed transfer  Transfers assist     Chair/bed transfer assist level: Contact Guard/Touching assist Chair/bed transfer assistive device:   Ambulation assist      Assist level: Contact Guard/Touching assist Assistive device: No Device Max distance: 250'   Walk 10 feet activity   Assist     Assist level: Contact Guard/Touching  assist Assistive device: No Device   Walk 50 feet activity   Assist    Assist level: Contact Guard/Touching assist Assistive device: No Device    Walk 150 feet activity   Assist    Assist level: Contact Guard/Touching assist Assistive device: No Device    Walk 10 feet on uneven surface  activity   Assist     Assist level: Minimal Assistance - Patient > 75%     Wheelchair     Assist Will patient use wheelchair at discharge?: No             Wheelchair 50 feet with 2 turns  activity    Assist            Wheelchair 150 feet activity     Assist          Blood pressure 113/68, pulse 89, temperature 98.5 F (36.9 C), resp. rate 16, height 5\' 7"  (1.702 m), weight 77.1 kg, SpO2 100 %.  Medical Problem List and Plan: 1.Decreased functional mobilitysecondary to Parkinson's disease [facility]/delir[facility] -patientmay notshower  8/13- will d/c staples- and then pt can shower  8/16- pt can shower- will remind OT -ELOS/Goals: 6-9 days/Mod I/supervision Continue CIR 2. Antithrombotics:SCDs -DVT/anticoagulation:  -antiplatelet therapy: Aspirin 81 mg daily 3. Pain Management/chronicbackpain:Currently on home regimen of oxycodone 15 mg 4 times daily, Flexeril 10 mg 3 times daily as needed. Monitor mental status  8/12- c/o no pain this AM- nursing went in after I did to give AM meds.   8/13- will decrease to TID per wife's request- if tolerates well, Monday will decrease to BID  8/15: well controlled  8/16- reduce to BID per wife request Monitor with increased exertion. 4. Mood:Ambien as prior to admission decreased to 5 mg nightly -antipsychotic agents: Seroquel 12.5 mg nightly as needed  8/15: Posey bed discontinued as worsens patient's agitation. Wife understanding of risks.    8/16- pt still very agitated/trying to get out of bed all night- even with wife there.   5. Neuropsych: This patientisNOTcapable of making decisions on hisown behalf. 6. Skin/Wound Care:Routine skin checks  Staples removed 8/13, healing well 7. Fluids/Electrolytes/Nutrition:Routine in and outs. CMP ordered for tomorrow a.m.  8/16- labs in AM- didn't get Monday 8.Parkinson's disease. Continue Sinemet 25-100 mg 4 times daily as well as Artane 2 mg 3 times daily 9. Hypertension. Lisinopril 5 mg daily.   Monitor with increased mobility. Well controlled 10. B12 deficiency. Complete 7-daycourse of vitamin B12 1000 mcg daily x7 days 11. Hypothyroidism. Continue Synthroid    LOS: 5 days A FACE TO FACE EVALUATION WAS PERFORMED  Aalivia Mcgraw 06/26/2020, 3:56 PM

## 2020-06-26 NOTE — Progress Notes (Signed)
Pt continues to be very agitated and trying to get up out of the bed. Pts wife at bedside and able to redirect pt, but pt continues to try and get out of the bed. Pt has been given the PRNs to help him relax and sleep. The PRN meds have not been effective for the pt.

## 2020-06-26 NOTE — Progress Notes (Signed)
Occupational Therapy Session Note  Patient Details  Name: Reginald Jenkins MRN: [id number] Date of Birth: [birth year]  Today's Date: 06/26/2020 OT Individual Time: 0900-1000 OT Individual Time Calculation (min): 60 min    Short Term Goals: Week 1:  OT Short Term Goal 1 (Week 1): Pt will transfer to toilet wiht CGA and LRAD OT Short Term Goal 2 (Week 1): Pt will bathe body parts with MOD VC to demo improved sequencing OT Short Term Goal 3 (Week 1): Pt will don shirt with S OT Short Term Goal 4 (Week 1): Pt will don LB clothing wiht min VC for threading BLE seated and CGA for balance  Skilled Therapeutic Interventions/Progress Updates:  Patient met seated on commode with therapy staff after episode of incontinence during previous therapy session. Patient in agreement with OT treatment session with focus on cognition in prep for safe completion of functional tasks, functional transfers, and BUE strengthening as detailed below. Sit to stand from standard commode with CGA and cues for safety. Patient completed hand hygiene standing at sink level with cues for sequencing and initiation/termination of task demonstrating perseveration on applying soap to hands. Seated in recliner patient donned shoes with maximal cues for safety and sequencing. Mobility to dayroom with patient able to follow verbal directions with maximal and repeat cueing. Card activity with focus on following 1-2 step verbal cues, memory, and sequencing. In therapy gym, patient engaged in [place] with 5lb weighted dowel for 1 set x10 reps each. There. act. With focus on dynamic standing balance and safety with bending/reaching outside of BOS. Session concluded with patient seated in recliner with wife present at bedside, call bell within reach, belt alarm activated, and all needs met.  Therapy Documentation Precautions:  Precautions Precautions: Fall Precaution Comments: wife denies fall history  Restrictions Weight Bearing  Restrictions: No General:    Therapy/Group: Individual Therapy  Layliana Devins R [place]-Davis 06/26/2020, 7:55 AM

## 2020-06-27 ENCOUNTER — Inpatient Hospital Stay (HOSPITAL_COMMUNITY): Payer: Medicare Other | Admitting: Speech Pathology

## 2020-06-27 ENCOUNTER — Inpatient Hospital Stay (HOSPITAL_COMMUNITY): Payer: Medicare Other | Admitting: Occupational Therapy

## 2020-06-27 ENCOUNTER — Inpatient Hospital Stay (HOSPITAL_COMMUNITY): Payer: Medicare Other

## 2020-06-27 LAB — CBC WITH DIFFERENTIAL/PLATELET
Abs Immature Granulocytes: 0.02 10*3/uL (ref 0.00–0.07)
Basophils Absolute: 0 10*3/uL (ref 0.0–0.1)
Basophils Relative: 1 %
Eosinophils Absolute: 0.2 10*3/uL (ref 0.0–0.5)
Eosinophils Relative: 4 %
HCT: 39.2 % (ref 39.0–52.0)
Hemoglobin: 12.8 g/dL — ABNORMAL LOW (ref 13.0–17.0)
Immature Granulocytes: 0 %
Lymphocytes Relative: 33 %
Lymphs Abs: 2.1 10*3/uL (ref 0.7–4.0)
MCH: 29.9 pg (ref 26.0–34.0)
MCHC: 32.7 g/dL (ref 30.0–36.0)
MCV: 91.6 fL (ref 80.0–100.0)
Monocytes Absolute: 1 10*3/uL (ref 0.1–1.0)
Monocytes Relative: 15 %
Neutro Abs: 3 10*3/uL (ref 1.7–7.7)
Neutrophils Relative %: 47 %
Platelets: 297 10*3/uL (ref 150–400)
RBC: 4.28 MIL/uL (ref 4.22–5.81)
RDW: 12.9 % (ref 11.5–15.5)
WBC: 6.4 10*3/uL (ref 4.0–10.5)
nRBC: 0 % (ref 0.0–0.2)

## 2020-06-27 LAB — BASIC METABOLIC PANEL
Anion gap: 9 (ref 5–15)
BUN: 13 mg/dL (ref 8–23)
CO2: 27 mmol/L (ref 22–32)
Calcium: 9.5 mg/dL (ref 8.9–10.3)
Chloride: 101 mmol/L (ref 98–111)
Creatinine, Ser: 0.71 mg/dL (ref 0.61–1.24)
GFR calc Af Amer: >60 mL/min (ref >60)
GFR calc non Af Amer: >60 mL/min (ref >60)
Glucose, Bld: 123 mg/dL — ABNORMAL HIGH (ref 70–99)
Potassium: 4.2 mmol/L (ref 3.5–5.1)
Sodium: 137 mmol/L (ref 135–145)

## 2020-06-27 MED ORDER — QUETIAPINE FUMARATE 25 MG PO TABS
25.0000 mg | ORAL_TABLET | Freq: Every day | ORAL | Status: DC
  Administered 2020-06-27 – 2020-06-28 (×2): 25 mg via ORAL
  Filled 2020-06-27 (×3): qty 1

## 2020-06-27 MED ORDER — QUETIAPINE FUMARATE 25 MG PO TABS: 25.0000 mg | ORAL_TABLET | Freq: Every evening | ORAL | Status: DC | PRN

## 2020-06-27 MED ORDER — HYDROCERIN EX CREA
TOPICAL_CREAM | Freq: Two times a day (BID) | CUTANEOUS | Status: DC
  Administered 2020-06-30 – 2020-07-04 (×3): 1 via TOPICAL
  Filled 2020-06-27: qty 113

## 2020-06-27 MED ORDER — TRAZODONE HCL 50 MG PO TABS
75.0000 mg | ORAL_TABLET | Freq: Every day | ORAL | Status: DC
  Administered 2020-06-27 – 2020-06-28 (×2): 75 mg via ORAL
  Filled 2020-06-27 (×2): qty 2

## 2020-06-27 NOTE — Progress Notes (Signed)
Physical Therapy Session Note  Patient Details  Name: Reginald Jenkins MRN: [id number] Date of Birth: August 26, 1946  Today's Date: 06/27/2020 PT Individual Time: 5366-4403 PT Individual Time Calculation (min): 43 min  and Today's Date: 06/27/2020 PT Missed Time: 30 Minutes Missed Time Reason: Other (Comment) (PT with other pt who had rapid response)  Short Term Goals: Week 1:  PT Short Term Goal 1 (Week 1): Pt will perform bed mobility with CGA PT Short Term Goal 2 (Week 1): Pt will perform bed to chair transfer with CGA PT Short Term Goal 3 (Week 1): Pt will ambulate 150' with CGA and LRAD  Skilled Therapeutic Interventions/Progress Updates:     Pt received seated in recliner with wife present. No pain. CGA for sit to stand with cues on hand placement and pt performs stand step transfer to WC. WC transport outside for practice with overground ambulation with varying surface types and grades. Pt ambulates 250' with CGA at hips for stability, performing sudden turns and stops/starts depending on PT instructions. Pt takes seated rest break then performs multiple bouts of forward and backward gait to improve functional balance. Pt instructed to take "10 big steps forward and 10 big steps backward." Pt then allowed to perform activity without verbal cuing to challenge task following. Pt takes 12 steps forward and 30 steps backward, covering same distance. Pt performs additional bouts with increased cuing to work on increased stride length, especially going backward, to improve balance and gait mechanics.   Pt instructed to "find basketball in gym and put it in the hoop." Pt ambulates around gym with CGA and requires mod cuing to visually scan to find ball, then additional cuing to remember 2nd step of task, to put ball in hoop.  Pt left seated in recliner with alarm intact and all needs within reach. PT educates pt's wife on guarding pt while ambulating and wife given freedom to ambulate short distance  with pt. Safety sheet updated.  Therapy Documentation Precautions:  Precautions Precautions: Fall Precaution Comments: wife denies fall history  Restrictions Weight Bearing Restrictions: No    Therapy/Group: Individual Therapy  Beau Fanny, PT, DPT 06/27/2020, 4:04 PM

## 2020-06-27 NOTE — Progress Notes (Signed)
Occupational Therapy Session Note  Patient Details  Name: Reginald Jenkins MRN: [id number] Date of Birth: September 02, 1946  Today's Date: 06/27/2020 OT Individual Time: 1430-1531 OT Individual Time Calculation (min): 61 min    Short Term Goals: Week 1:  OT Short Term Goal 1 (Week 1): Pt will transfer to toilet wiht CGA and LRAD OT Short Term Goal 2 (Week 1): Pt will bathe body parts with MOD VC to demo improved sequencing OT Short Term Goal 3 (Week 1): Pt will don shirt with S OT Short Term Goal 4 (Week 1): Pt will don LB clothing wiht min VC for threading BLE seated and CGA for balance   Skilled Therapeutic Interventions/Progress Updates:    Pt greeted at time of session ambulating with wife in hall (approved to do so) and took over ambulating to gym with CGA/CS with RW and wife providing support part of the way to improve her comfort with ambulating. Transferred to therapy mat in the same manner. Dynamic seated task of wiping away numbers in numerical order on mirror for both 1-10 and 1-15, approximately 75% accuracy and stating "that was hard." Encouraged to count out loud to problem solve and sequence. Discussed techniques with wife to improve bed time routine to be more familiar to his home routine to improve restlessness at night. Ambulated with RW to ortho gym with CGA/CS and performed dynamic standing Dynavision task for 2 rounds of 3 minutes to improve balance, coordination, and visual scanning. BUE strengthening with 3# dowel for 2 sets of 30 forward/backward circles with cues to make large circles to decrease parkinsons behaviors. Ambulated back to room with RW, cues for larger steps to decrease shuffling gait, transferred to recliner CGA/CS with alarm on, call bell in reach.   Therapy Documentation Precautions:  Precautions Precautions: Fall Precaution Comments: wife denies fall history  Restrictions Weight Bearing Restrictions: No     Therapy/Group: Individual Therapy  Erasmo Score 06/27/2020, 3:33 PM

## 2020-06-27 NOTE — Plan of Care (Signed)
°  Problem: Consults °Goal: RH GENERAL PATIENT EDUCATION °Description: See Patient Education module for education specifics. °Outcome: Progressing °Goal: Skin Care Protocol Initiated - if Braden Score 18 or less °Description: If consults are not indicated, leave blank or document N/A °Outcome: Progressing °Goal: Nutrition Consult-if indicated °Outcome: Progressing °  °Problem: RH BOWEL ELIMINATION °Goal: RH STG MANAGE BOWEL WITH ASSISTANCE °Description: STG Manage Bowel with min Assistance. °Outcome: Progressing °Goal: RH STG MANAGE BOWEL W/MEDICATION W/ASSISTANCE °Description: STG Manage Bowel with Medication with min Assistance. °Outcome: Progressing °  °Problem: RH BLADDER ELIMINATION °Goal: RH STG MANAGE BLADDER WITH ASSISTANCE °Description: STG Manage Bladder With min Assistance °Outcome: Progressing °  °Problem: RH SKIN INTEGRITY °Goal: RH STG SKIN FREE OF INFECTION/BREAKDOWN °Description: Skin to remain free of breakdown while on rehab with min assist. °Outcome: Progressing °Goal: RH STG MAINTAIN SKIN INTEGRITY WITH ASSISTANCE °Description: STG Maintain Skin Integrity With min Assistance. °Outcome: Progressing °Goal: RH STG ABLE TO PERFORM INCISION/WOUND CARE W/ASSISTANCE °Description: STG Able To Perform Incision/Wound Care With min Assistance. °Outcome: Progressing °  °Problem: RH SAFETY °Goal: RH STG ADHERE TO SAFETY PRECAUTIONS W/ASSISTANCE/DEVICE °Description: STG Adhere to Safety Precautions With min Assistance and appropriate assistive Device. °Outcome: Progressing °  °Problem: RH PAIN MANAGEMENT °Goal: RH STG PAIN MANAGED AT OR BELOW PT'S PAIN GOAL °Description: <4 on a 0-10 pain scale °Outcome: Progressing °  °Problem: RH KNOWLEDGE DEFICIT GENERAL °Goal: RH STG INCREASE KNOWLEDGE OF SELF CARE AFTER HOSPITALIZATION °Description: Patient and caregiver will be able to demonstrate knowledge of medication management, skin care, mobility safety, and follow up care with the MD post discharge from CIR with  min assist from staff. °Outcome: Progressing °  °

## 2020-06-27 NOTE — Progress Notes (Addendum)
[place] PHYSICAL MEDICINE & REHABILITATION PROGRESS NOTE   Subjective/Complaints:  Wife was upset last night-supposedly tearful-  pt get trying to get out of bed- was agitated- per pt, "he was a nuisance" overnight-  There are multiple notes from nursing about behavior- and prns not helpful.    ROS: Limited by cognition   Objective:   No results found. Recent Labs    06/27/20 0700  WBC 6.4  HGB 12.8*  HCT 39.2  PLT 297   Recent Labs    06/27/20 0700  NA 137  K 4.2  CL 101  CO2 27  GLUCOSE 123*  BUN 13  CREATININE 0.71  CALCIUM 9.5    Intake/Output Summary (Last 24 hours) at 06/27/2020 0858 Last data filed at 06/27/2020 0700 Gross per 24 hour  Intake 660 ml  Output --  Net 660 ml     Physical Exam: Vital Signs Blood pressure 125/79, pulse 95, temperature 97.9 F (36.6 C), temperature source Oral, resp. rate 18, height 5\' 7"  (1.702 m), weight 77.1 kg, SpO2 100 %.   Gen: awake, sitting up in bedside chair, Ox1- working with SLP on sequencing- needs a LOT of cues, NAD HENT: healing incisions on head- staples out- sensitive to touch per pt CV: RRR Resp: CTA B/L- no W/R/R- good air movement GI: Soft, NT, ND, (+)BS  Neurological: Pt Ox1 only -- no posey bed in room Mild dysarthria  He does have a resting tremor esp in RUE- fast tremor- constantly- no changeMotor: Grossly 5/5 throughout Psychiatric:  Comments: flat affect    Assessment/Plan: 1. 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  Physiatrist is providing close team supervision and 24 hour management of active medical problems listed below.  Physiatrist and rehab team continue to assess barriers to discharge/monitor patient progress toward functional and medical goals  Care Tool:  Bathing    Body parts bathed by patient:  Chest, Abdomen, Front perineal area, Face   Body parts bathed by helper: Right arm, Left arm, Chest, Abdomen, Front perineal area, Buttocks, Right upper leg, Right lower leg, Left upper leg, Left lower leg, Face     Bathing assist Assist Level: Total Assistance - Patient < 25%     Upper Body Dressing/Undressing Upper body dressing   What is the patient wearing?: Pull over shirt    Upper body assist Assist Level: Maximal Assistance - Patient 25 - 49%    Lower Body Dressing/Undressing Lower body dressing      What is the patient wearing?: Incontinence brief     Lower body assist Assist for lower body dressing: Moderate Assistance - Patient 50 - 74%     Toileting Toileting    Toileting assist Assist for toileting: Moderate Assistance - Patient 50 - 74%     Transfers Chair/bed transfer  Transfers assist     Chair/bed transfer assist level: Contact Guard/Touching assist Chair/bed transfer assistive device:   Ambulation assist      Assist level: Contact Guard/Touching assist Assistive device: No Device Max distance: 250'   Walk 10 feet activity   Assist     Assist level: Contact Guard/Touching assist Assistive device: No Device   Walk 50 feet activity   Assist    Assist level: Contact Guard/Touching assist Assistive device: No Device    Walk 150 feet activity   Assist  Assist level: Contact Guard/Touching assist Assistive device: No Device    Walk 10 feet on uneven surface  activity   Assist     Assist level: Minimal Assistance - Patient > 75%     Wheelchair     Assist Will patient use wheelchair at discharge?: No             Wheelchair 50 feet with 2 turns activity    Assist            Wheelchair 150 feet activity     Assist          Blood pressure 125/79, pulse 95, temperature 97.9 F (36.6 C), temperature source Oral, resp. rate 18, height 5\' 7"  (1.702 m), weight  77.1 kg, SpO2 100 %.  Medical Problem List and Plan: 1.Decreased functional mobilitysecondary to Parkinson's disease [facility]del[facility] -patientmay notshower  8/13- will d/c staples- and then pt can shower  8/16- pt can shower- will remind OT -ELOS/Goals: 6-9 days/Mod I/supervision Continue CIR 2. Antithrombotics:SCDs -DVT/anticoagulation:  -antiplatelet therapy: Aspirin 81 mg daily 3. Pain Management/chronicbackpain:Currently on home regimen of oxycodone 15 mg 4 times daily, Flexeril 10 mg 3 times daily as needed. Monitor mental status  8/12- c/o no pain this AM- nursing went in after I did to give AM meds.   8/13- will decrease to TID per wife's request- if tolerates well, Monday will decrease to BID  8/15: well controlled  8/16- reduce to BID per wife request Monitor with increased exertion. 4. Mood:Ambien as prior to admission decreased to 5 mg nightly -antipsychotic agents: Seroquel 12.5 mg nightly as needed  8/15: Posey bed discontinued as worsens patient's agitation. Wife understanding of risks.    8/16- pt still very agitated/trying to get out of bed all night- even with wife there.   8/17- will increase Seroquel to 25 mg QHS prn and will d/c ambien- add Trazodone 75 mg QHS and also schedule trazodone and seroquel to be given at 8pm- also can repeat seroquel at night as needed x1  5. Neuropsych: This patientisNOTcapable of making decisions on hisown behalf. 6. Skin/Wound Care:Routine skin checks  Staples removed 8/13, healing well 7. Fluids/Electrolytes/Nutrition:Routine in and outs. CMP ordered for tomorrow a.m.  8/16- labs in AM- didn't get Monday 8.Parkinson's disease. Continue Sinemet 25-100 mg 4 times daily as well as Artane 2 mg 3 times daily 9. Hypertension. Lisinopril 5 mg daily.   Monitor with increased mobility. Well controlled 10. B12 deficiency. Complete 7-daycourse of vitamin B12 1000 mcg daily x7 days 11. Hypothyroidism. Continue Synthroid    LOS: 6 days A FACE TO FACE EVALUATION WAS PERFORMED  Alexa Blish 06/27/2020, 8:58 AM

## 2020-06-27 NOTE — Progress Notes (Addendum)
Patient ID: HAMED DEBELLA, male   DOB: Jul 14, 1946, 74 y.o.   MRN: [id number]  SW met with pt and pt husband in room to provide updates from team conference, and d/c date 8/25. Wife reports that there previous HHA was [employer] [facility] in [place], [place]. [facility].   SW returned phone call to RN Johnney Ou Hursch-DBS Coordinator [phone]) with [facility] office with Dr. Ouida Sills and Dr. Mariah Milling [phone]). She requested a zoom call or conference call on Friday at 2:30pm to discuss his progress here, as well as, the significant change he has had from being independent PTA. Sw provided updates from team conference. SW informed concerns would be relayed to medical team.  SW informed medical team on above.   Loralee Pacas, MSW, [place] Office: [phone] Cell: [phone] Fax: [phone]

## 2020-06-27 NOTE — Progress Notes (Signed)
Speech Language Pathology Daily Session Note  Patient Details  Name: RUSELL MENEELY MRN: [id number] Date of Birth: 1946/06/12  Today's Date: 06/27/2020 SLP Individual Time: 0730-0825 SLP Individual Time Calculation (min): 55 min  Short Term Goals: Week 1: SLP Short Term Goal 1 (Week 1): Patient will attend to basic functional tasks with moderate frequency of cues to redirect. SLP Short Term Goal 2 (Week 1): Patient will complete basic level functional problem solving tasks with moderate A and cues. SLP Short Term Goal 3 (Week 1): Patient will safely consume least restrictive oral diet at setup and supervision A. SLP Short Term Goal 4 (Week 1): Patient will utilize visual aids for orientation to place, time, situation with moderate A cues. SLP Short Term Goal 5 (Week 1): Patient will initiate verbal requests for specific wants/needs with min A cues.  Skilled Therapeutic Interventions: Pt was seen for skilled ST targeting cognitive goals. Pt's wife at bedside for ~2/3 of session. Pt oriented to "hospital" but not city and month but not year. Min A question cues provided for orientation to situation. External aids posted around room to assist with pt's recall of orientation information and safety awareness. Pt returned demonstration of how to use call bell to request assistance with Mod A verbal and visual cues. He stated awareness he should not attempt to get up by himself, however cues needed for problem solving and anticipating how/when to use call light. Pt also required Max A verbal and visual cues for problem solving and error awareness during a basic 3-step action card sequencing task. Of note, intermittent language of confusion still evident throughout session. Pt left sitting in recliner with seatbelt alarm in place, needs met to his satisfaction and call bell in lap. Continue per current plan of care.          Pain Pain Assessment Pain Scale: 0-10 Pain Score: 0-No  pain  Therapy/Group: Individual Therapy  Arbutus Leas 06/27/2020, 12:51 PM

## 2020-06-27 NOTE — Patient Care Conference (Signed)
Inpatient RehabilitationTeam Conference and Plan of Care Update Date: 06/27/2020   Time: 11:50 AM   Patient Name: Reginald Jenkins      Medical Record Number: [id number]  Date of Birth: 29-Mar-1946 Sex: Male         Room/Bed: 4W15C/4W15C-02 Payor Info: Payor: MEDICARE / Plan: MEDICARE PART A AND B / Product Type: *No Product type* /    Admit Date/Time:  06/21/2020  4:29 PM  Primary Diagnosis:  Debility  Hospital Problems: Principal Problem:   Debility Active Problems:   Parkinson disease [facility])    Expected Discharge Date: Expected Discharge Date: 07/05/20  Team Members Present: Physician leading conference: Dr. Genice Rouge Care Coodinator Present: Cecile Sheerer, LCSWA;Nicollette Wilhelmi Marlyne Beards, RN, BSN, CRRN Nurse Present: Otilio Carpen, RN PT Present: Malachi Pro, PT OT Present: Earleen Newport, OT SLP Present: Suzzette Righter, CF-SLP PPS Coordinator present : Edson Snowball, Park Breed, SLP     Current Status/Progress Goal Weekly Team Focus  Bowel/Bladder   Pt is continent of b/b. LBM 06/26/20  Pt will remain continent of b/b  Q2h toileting/PRN   Swallow/Nutrition/ Hydration             ADL's   Mod for LB ADLs d/t cognition/decreased balance, UB ADLs Min-Supervision, toileting Mod for clothing and hygiene  Supervision for most, Min for LB ADLs  standing balance/tolerance, sequencing ADL tasks, ADL transfers, LB ADLs   Mobility   CGA ambulation >200'. supervision bed mobility.  Supervision  dynamic balance, multitasking, DC prep   Communication   Min-Mod  Min  basic comprehension, expressing basic wants and needs   Safety/Cognition/ Behavioral Observations  Mod-Max  Min-Mod  orientation, problem solving recall, attention, emergent awareness   Pain   Pt c/o of R hip pain. Due to increased confusion, pts wife refuses Oxy  Pt will be pain free  Assess pain q2h/toiling   Skin   No obvious signs of sking breakdown noted, bruising to bilateral upper extremeties.  Pt will remain free  from skin breakdown and infection  Assess skin Q2h/PRN     Discharge Planning:  D/c to home with 24/7 care from his wife, and PRN assistance from children.   Team Discussion: No c/o pain, holding Oxycodone, giving Tylenol instead. Shower level bathing requires lots of cueing. Difficulty with multi-tasking. PT says wife can walk the patient in the halls to help decrease his confusion, agitation, and maybe help him rest better at night. SLP - mod/max assist with confusion and orientation. Pt hasn't been doing well at night, he gets confused and becomes agitated. Patient on target to meet rehab goals: yes  *See Care Plan and progress notes for long and short-term goals.   Revisions to Treatment Plan:  Dc'd Ambien, added Trazodone.  Teaching Needs: Continue family education.  Current Barriers to Discharge: Decreased caregiver support, Home enviroment access/layout, Lack of/limited family support and Behavior  Possible Resolutions to Barriers: Begin family education, wife able to ambulate patient in hall. MD changed some of pts medications.      Medical Summary Current Status: Behavior OK during day- contient during day- holding oxy since Sat since no pain- agitated/hallucinating at night- trying to get OOB- difficult to reorient  Barriers to Discharge: Behavior;Decreased family/caregiver support;Home enviroment access/layout;Medication compliance;Medical stability;Other (comments)  Barriers to Discharge Comments: d/c'd posey bed per wife request- to turn on DBS at f/u- confusion that fluctuates- mod-max for basic tasks SLP Possible Resolutions to [employer] Focus: wife to walk pt to tire him out- incresed seroquel-  changed ambien to Trazodone. - d/c8/25   Continued Need for Acute Rehabilitation Level of Care: The patient requires daily medical management by a physician with specialized training in physical medicine and rehabilitation for the following reasons: Direction of a  multidisciplinary physical rehabilitation program to maximize functional independence : Yes Medical management of patient stability for increased activity during participation in an intensive rehabilitation regime.: Yes Analysis of laboratory values and/or radiology reports with any subsequent need for medication adjustment and/or medical intervention. : Yes   I attest that I was present, lead the team conference, and concur with the assessment and plan of the team.   Tennis Must 06/27/2020, 4:25 PM

## 2020-06-27 NOTE — Progress Notes (Signed)
Pt continues to try and get out of the bed and chair. Pt is mumbling/rambling continuously, and still having hallucinations. Pt c/o of minor R hip pain. This nurse and Wife at bedside attempting to redirect pt at this time.

## 2020-06-27 NOTE — Progress Notes (Signed)
Pt very restless and agitated at this time, attempting to get out of the bed. Pts wife at beside side, very tearful, and attempting to calm the pt down. Pt also having increased tremors and having  hallucinations and seeing objects and people that are not currently in the room. Pt as been given all available PRN medications, non have been effective at this time. This nurse and wife at bedside, attempting to redirect the pt, but not having success at this time.

## 2020-06-28 ENCOUNTER — Inpatient Hospital Stay (HOSPITAL_COMMUNITY): Payer: Medicare Other | Admitting: Occupational Therapy

## 2020-06-28 ENCOUNTER — Inpatient Hospital Stay (HOSPITAL_COMMUNITY): Payer: Medicare Other

## 2020-06-28 ENCOUNTER — Inpatient Hospital Stay (HOSPITAL_COMMUNITY): Payer: Medicare Other | Admitting: Speech Pathology

## 2020-06-28 MED ORDER — FERROUS SULFATE 325 (65 FE) MG PO TABS
325.0000 mg | ORAL_TABLET | Freq: Two times a day (BID) | ORAL | Status: DC
  Administered 2020-06-29 – 2020-07-05 (×13): 325 mg via ORAL
  Filled 2020-06-28 (×13): qty 1

## 2020-06-28 MED ORDER — ADULT MULTIVITAMIN W/MINERALS CH
1.0000 | ORAL_TABLET | Freq: Every day | ORAL | Status: DC
  Administered 2020-06-29 – 2020-07-05 (×7): 1 via ORAL
  Filled 2020-06-28 (×7): qty 1

## 2020-06-28 NOTE — Progress Notes (Addendum)
[place] PHYSICAL MEDICINE & REHABILITATION PROGRESS NOTE   Subjective/Complaints:   Per pt and wife, pt slept ALL night- from 10 pm to 5am- and wife was grateful could walk pt around when needed yesterday/while here.   Pt admits he's sleepy this AM; but are all breakfast Remember I came back to see him yesterday and said was grateful, so memory doing somewhat better.    TIR:WERXVQM by cognition Objective:   No results found. Recent Labs    06/27/20 0700  WBC 6.4  HGB 12.8*  HCT 39.2  PLT 297   Recent Labs    06/27/20 0700  NA 137  K 4.2  CL 101  CO2 27  GLUCOSE 123*  BUN 13  CREATININE 0.71  CALCIUM 9.5    Intake/Output Summary (Last 24 hours) at 06/28/2020 1909 Last data filed at 06/28/2020 1234 Gross per 24 hour  Intake 826 ml  Output --  Net 826 ml     Physical Exam: Vital Signs Blood pressure 100/65, pulse 80, temperature 98.5 F (36.9 C), temperature source Oral, resp. rate 16, height 5\' 7"  (1.702 m), weight 77.1 kg, SpO2 100 %.   Gen: awake, Ox2! This AM- which is better and remembering some things from day prior, wife in room, not agitated, NAD HENT: healing incisions on head- staples out- sensitive to touch per pt CV: RRR Resp: CTA B/L- no W/R/R- good air movement GI: Soft, NT, ND, (+)BS  Neurological: Ox2!- remembers I came to visit x2 yesterday and for what- now knows in hospital/why and self- still needs cues for date/etc.  Mild dysarthria  He does have a resting tremor esp in RUE- fast tremor- constantly- no changeMotor: Grossly 5/5 throughout Psychiatric:  Comments: less flat- winked at me today    Assessment/Plan: 1. 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  Physiatrist is providing close team supervision and 24 hour management of active medical problems listed  below.  Physiatrist and rehab team continue to assess barriers to discharge/monitor patient progress toward functional and medical goals  Care Tool:  Bathing    Body parts bathed by patient: Chest, Abdomen, Front perineal area, Face, Right arm, Left arm, Buttocks, Right upper leg, Left upper leg, Right lower leg, Left lower leg   Body parts bathed by helper: Right arm, Left arm, Chest, Abdomen, Front perineal area, Buttocks, Right upper leg, Right lower leg, Left upper leg, Left lower leg, Face     Bathing assist Assist Level: Contact Guard/Touching assist     Upper Body Dressing/Undressing Upper body dressing   What is the patient wearing?: Pull over shirt    Upper body assist Assist Level: Minimal Assistance - Patient > 75%    Lower Body Dressing/Undressing Lower body dressing      What is the patient wearing?: Underwear/pull up, Pants     Lower body assist Assist for lower body dressing: Contact Guard/Touching assist     Toileting Toileting    Toileting assist Assist for toileting: Moderate Assistance - Patient 50 - 74%     Transfers Chair/bed transfer  Transfers assist     Chair/bed transfer assist level: Contact Guard/Touching assist Chair/bed transfer assistive device:   Ambulation assist      Assist level: Contact Guard/Touching assist Assistive device: No Device Max distance: +300'   Walk 10 feet activity   Assist  Assist level: Contact Guard/Touching assist Assistive device: No Device   Walk 50 feet activity   Assist    Assist level: Contact Guard/Touching assist Assistive device: No Device    Walk 150 feet activity   Assist    Assist level: Contact Guard/Touching assist Assistive device: No Device    Walk 10 feet on uneven surface  activity   Assist     Assist level: Minimal Assistance - Patient > 75%     Wheelchair     Assist Will patient use wheelchair at discharge?: No              Wheelchair 50 feet with 2 turns activity    Assist            Wheelchair 150 feet activity     Assist          Blood pressure 100/65, pulse 80, temperature 98.5 F (36.9 C), temperature source Oral, resp. rate 16, height 5\' 7"  (1.702 m), weight 77.1 kg, SpO2 100 %.  Medical Problem List and Plan: 1.Decreased functional mobilitysecondary to Parkinson's disease [facility]/delir[facility] -patientmay notshower  8/13- will d/c staples- and then pt can shower  8/16- pt can shower- will remind OT -ELOS/Goals: 6-9 days/Mod I/supervision Continue CIR 2. Antithrombotics:SCDs -DVT/anticoagulation:  -antiplatelet therapy: Aspirin 81 mg daily 3. Pain Management/chronicbackpain:Currently on home regimen of oxycodone 15 mg 4 times daily, Flexeril 10 mg 3 times daily as needed. Monitor mental status  8/12- c/o no pain this AM- nursing went in after I did to give AM meds.   8/13- will decrease to TID per wife's request- if tolerates well, Monday will decrease to BID  8/15: well controlled  8/16- reduce to BID per wife request Monitor with increased exertion.  8/18- per staff, hasn't received any pain meds since last week/Friday- fyi 4. Mood:Ambien as prior to admission decreased to 5 mg nightly -antipsychotic agents: Seroquel 12.5 mg nightly as needed  8/15: Posey bed discontinued as worsens patient's agitation. Wife understanding of risks.    8/16- pt still very agitated/trying to get out of bed all night- even with wife there.   8/17- will increase Seroquel to 25 mg QHS prn and will d/c ambien- add Trazodone 75 mg QHS and also schedule trazodone and seroquel to be given at 8pm- also can repeat seroquel at night as needed x1  8/18- slept all night- much more with it today- stopped Ambien- suggest not take it in  future  5. Neuropsych: This patientisNOTcapable of making decisions on hisown behalf. 6. Skin/Wound Care:Routine skin checks  Staples removed 8/13, healing well 7. Fluids/Electrolytes/Nutrition:Routine in and outs. CMP ordered for tomorrow a.m.  8/16- labs in AM- didn't get Monday 8.Parkinson's disease. Continue Sinemet 25-100 mg 4 times daily as well as Artane 2 mg 3 times daily 9. Hypertension. Lisinopril 5 mg daily.  Monitor with increased mobility. Well controlled  8/18- BP well controlled- con't meds 10. B12 deficiency. Complete 7-daycourse of vitamin B12 1000 mcg daily x7 days 11. Hypothyroidism. Continue Synthroid    Spent 40 minutes total on care today with rounds, note, and speaking with care coordinator of [place] about pt's care and our SW about his care- waiting to hear form [facility] at [place].     LOS: 7 days A FACE TO FACE EVALUATION WAS PERFORMED  Kiamesha Samet 06/28/2020, 7:09 PM

## 2020-06-28 NOTE — Progress Notes (Addendum)
Patient ID: Reginald Jenkins, male   DOB: 1945/12/24, 74 y.o.   MRN: [id number]   SW left message for RN Adella Nissen- DBS Coordinator [phone]) informing after speaking with attending, that there will be follow-up sometime this afternoon. SW encouraged follow-up if needed.   SW left message for [place] SW [facility] [phone]) to get updates on pt assigned SW. SW waiting on follow-up.   SW spoke with [employer] [phone]) to get updates on if they are servicing pt. Pt has an active case. Open services on 8/4 for PT. Pt to receive PT/OT. Sw faxed orders.  SW left message for Lowanda Foster (p:[phone] ext [phone]) and Narda Amber [phone] ext [phone]) SWs with [facility] and Outpatient clinics to find SW working with patient. SW waiting on follow-up.    Cecile Sheerer, MSW, LCSWA Office: [phone] Cell: [phone] Fax: [phone]

## 2020-06-28 NOTE — Progress Notes (Signed)
Occupational Therapy Weekly Progress Note  Patient Details  Name: Reginald Jenkins MRN: [id number] Date of Birth: 15-Aug-1946  Beginning of progress report period: June 22, 2020 End of progress report period: June 28, 2020  Today's Date: 06/28/2020 OT Individual Time: 1100-1156 OT Individual Time Calculation (min): 56 min    Patient has met 3 of 4 short term goals.  The pt is overall CGA with LB bathing and dressing with bathing at shower level, frequent verbal and tactile cues required to attend to body parts and sequence transition. Ambulated for functional mobility and ADL transfers with CS/CGA with both hand held assist and with RW. UB ADLs with CS for verbal cues for processing/sequencing. Toileting with CGS/CS for transfers to toilet but requires assist for hygiene at times d/t cognition for thoroughness. Supervision required at all times during ADLs for safety.   Patient continues to demonstrate the following deficits: muscle weakness, decreased cardiorespiratoy endurance, impaired timing and sequencing, unbalanced muscle activation, ataxia and decreased coordination, decreased motor planning, decreased initiation, decreased attention, decreased awareness, decreased problem solving, decreased safety awareness, decreased memory and delayed processing and decreased standing balance and decreased balance strategies and therefore will continue to benefit from skilled OT intervention to enhance overall performance with BADL and Reduce care partner burden.  Patient progressing toward long term goals..  Continue plan of care.  OT Short Term Goals Week 1:  OT Short Term Goal 1 (Week 1): Pt will transfer to toilet wiht CGA and LRAD OT Short Term Goal 1 - Progress (Week 1): Met OT Short Term Goal 2 (Week 1): Pt will bathe body parts with MOD VC to demo improved sequencing OT Short Term Goal 2 - Progress (Week 1): Progressing toward goal OT Short Term Goal 3 (Week 1): Pt will don shirt with  S OT Short Term Goal 3 - Progress (Week 1): Met OT Short Term Goal 4 (Week 1): Pt will don LB clothing wiht min VC for threading BLE seated and CGA for balance OT Short Term Goal 4 - Progress (Week 1): Met Week 2:  OT Short Term Goal 1 (Week 2): Pt will perform 3/3 toileting tasks with CS and Min VCs OT Short Term Goal 2 (Week 2): Pt will bathe body parts with MOD VC to demo improved sequencing OT Short Term Goal 3 (Week 2): Pt will perform LB dressing with occasional VCs and CS for balance OT Short Term Goal 4 (Week 2): Pt will consistently perform ADL transfers with CS and safe use of AD  Skilled Therapeutic Interventions/Progress Updates:    Pt greeted at time of session ambulating throughout room with wife (approved to do so), OT transitioning over task and pt agreeable to shower level bathing. Ambulated hand held assist to bathroom and transferred to shower seat with CGA, performed UB/LB bathing with CGA overall with frequent cues to remain seated for LB bathing for safety and cues to attend to each body part, tendency to perseverate on washing face. Dried off in the same manner, and ambulates with hand held assist back to room, SPT to bed in the same manner. UB/LB dress CGA with pt able to thread feet into appropriate holes in brief and pants without cues, static stand without UE support to don over hips. Verbal and tactile cues throughout ADL for sequencing, initiation, and task segmentation as well as safety. Wife present throughout entire ADL for family training. Ambulated with RW CGA/CS to gym and performed seated peg board activity to copy a picture  for both easy and moderate difficulty levels to improve processing. Ambulated back to room CGA/CS with RW and transferred to recliner in same manner. Alarm on, call bell in reach.   Therapy Documentation Precautions:  Precautions Precautions: Fall Precaution Comments: wife denies fall history  Restrictions Weight Bearing Restrictions:  No    Therapy/Group: Individual Therapy  Viona Gilmore 06/28/2020, 12:12 PM

## 2020-06-28 NOTE — Progress Notes (Signed)
Speech Language Pathology Weekly Progress and Session Note  Patient Details  Name: Reginald Jenkins MRN: [id number] Date of Birth: 07/24/1946  Beginning of progress report period: June 22, 2020 End of progress report period: June 28, 2020  Today's Date: 06/28/2020 SLP Individual Time: 0900-1000 SLP Individual Time Calculation (min): 60 min  Short Term Goals: Week 1: SLP Short Term Goal 1 (Week 1): Patient will attend to basic functional tasks with moderate frequency of cues to redirect. SLP Short Term Goal 1 - Progress (Week 1): Met SLP Short Term Goal 2 (Week 1): Patient will complete basic level functional problem solving tasks with moderate A and cues. SLP Short Term Goal 2 - Progress (Week 1): Not met SLP Short Term Goal 3 (Week 1): Patient will safely consume least restrictive oral diet at setup and supervision A. SLP Short Term Goal 3 - Progress (Week 1): Met SLP Short Term Goal 4 (Week 1): Patient will utilize visual aids for orientation to place, time, situation with moderate A cues. SLP Short Term Goal 4 - Progress (Week 1): Met SLP Short Term Goal 5 (Week 1): Patient will initiate verbal requests for specific wants/needs with min A cues. SLP Short Term Goal 5 - Progress (Week 1): Not met    New Short Term Goals: Week 2: SLP Short Term Goal 1 (Week 2): Patient will initiate verbal requests for specific wants/needs with min A cues. SLP Short Term Goal 2 (Week 2): Patient will complete basic level functional problem solving tasks with moderate A and cues. SLP Short Term Goal 3 (Week 2): Patient will maintain attention to task for 4-5 minutes with minimal frequency of cues to redirect. SLP Short Term Goal 4 (Week 2): Patient will utilize visual aids (calendar,etc) to orient to time, place, situation with min A cues. SLP Short Term Goal 5 (Week 2): Patient will demonstrate adequate safety awareness and sequencing when performing ADL's and/or simulated ADL's (while seated) with  mod A.  Weekly Progress Updates:  Patient has made progress with his STG's, meeting 3/5. He has exhibited improvements in overall confusion, attention to tasks, is safely consuming a regular solids, thin liquids PO diet without difficulty and without overt s/s of aspiration. He continues to require verbal and visual cues for orientation to time, place and situation. He continues to require moderate, sometimes mod-max A with problem solving tasks and moderate A with attention to tasks. He continues to benefit from skilled ST intervention to improve his cognitive-linguistic functioning prior to discharge.   Intensity: Minumum of 1-2 x/day, 30 to 90 minutes Frequency: 3 to 5 out of 7 days Duration/Length of Stay: 07/05/20 Treatment/Interventions: Cognitive remediation/compensation;Functional tasks;Patient/family education;Environmental Environmental consultant;Internal/external aids;Speech/Language facilitation;Therapeutic Activities   Daily Session  Skilled Therapeutic Interventions: Patient walked via walker with SLP from his hospital room to therapy room and did not exhibit significant errors in safety when doing so. He initially required moderate A cues for use of calendar but after two trials, he improved to min A. He was able to state that he was at [facility]. During novel task of matching picture cards via one of three ways (Blink game), patient improved from requiring maximal verbal, demonstration and written cues, to moderate cues to return-demonstrate. When completing pattern copying task using shape tiles, patient was not able to perform even when SLP adapted task to allow him to place tiles on corresponding drawing on dry erase board. He started to talk about  "binary coding" and SLP was not able to completely  redirect him away from this.  General    Pain Pain Assessment Pain Scale: 0-10 Pain Score: 0-No pain  Therapy/Group: Individual Therapy  Sonia Baller, MA,  CCC-SLP Speech Therapy

## 2020-06-28 NOTE — Progress Notes (Signed)
Physical Therapy Session Note  Patient Details  Name: Reginald Jenkins MRN: [id number] Date of Birth: 05/28/1946  Today's Date: 06/28/2020 PT Individual Time: 6834-1962 PT Individual Time Calculation (min): 70 min   Short Term Goals: Week 1:  PT Short Term Goal 1 (Week 1): Pt will perform bed mobility with CGA PT Short Term Goal 2 (Week 1): Pt will perform bed to chair transfer with CGA PT Short Term Goal 3 (Week 1): Pt will ambulate 150' with CGA and LRAD  Skilled Therapeutic Interventions/Progress Updates:     Pt received in dayroom walking with wife. Handed off to PT. Jenkins report of pain.  Pt performs NMR for dynamic standing balance, performing cornhole game and cued to step forward with LLE and control knee flexion eccentrically. PT provides CGA for safety. Pt tasked with retrieving bag in specific order to challenge command following and short term recall.   Pt performs dynamic balancing challenge with gait obstacle course, tasked with weaving between orange cones and stepping over non-orange cones. Pt completes obstacle course several times with CGA but is unable to consistently follow instructions, frequently straddling cones.   Pt tasked with ambulating to find numbered latex circles around room in numerical order. Pt requires min/mod cuing to visually scan room but does pick up circles in correct order.    Pt ambulates +300' outside with CGA. Ambulates over unlevel ground and varying surfaces to challenge balance. Jenkins overt LOBs. PT cues for upright gaze and increased stride length to improve balance and decrease risk for falls.  Pt left seated with wife with all needs within reach.  Therapy Documentation Precautions:  Precautions Precautions: Fall Precaution Comments: wife denies fall history  Restrictions Weight Bearing Restrictions: Jenkins   Therapy/Group: Individual Therapy  Beau Fanny, PT, DPT 06/28/2020, 3:48 PM

## 2020-06-28 NOTE — Progress Notes (Signed)
Nutrition Follow-up  RD working remotely.  DOCUMENTATION CODES:   Not applicable  INTERVENTION:   - Continue Ensure Enlive po BID, each supplement provides 350 kcal and 20 grams of protein  - d/c liquid MVI and order MVI with minerals daily  - Continue with liberalized Regular diet  NUTRITION DIAGNOSIS:   Increased nutrient needs related to other (therapies) as evidenced by estimated needs.  Ongoing  GOAL:   Patient will meet greater than or equal to 90% of their needs  Progressing  MONITOR:   PO intake, Supplement acceptance, Labs, Weight trends, Skin  REASON FOR ASSESSMENT:   Malnutrition Screening Tool    ASSESSMENT:   74 year old male with PMH of Parkinson's disease s/p brain stimulator placement on 06/06/20, HTN, hypothyroidism, chronic back pain. Admitted to CIR on 06/21/20.  Noted target d/c date of 8/25.  Pt accepting 100% of Ensure Enlive supplements per [facility] documentation.  No new weights since 06/21/20.  Unable to reach pt or family via phone call to room. Will continue with current supplement regimen given good acceptance per [facility] documentation. Meal completions averaging ~75%.  Meal Completion: 25-100% x last 8 documented meals (averaging 74%)  Medications reviewed and include: sinemet, colace, Ensure Enlive BID, ferrous sulfate, liquid MVI  Labs reviewed.  Diet Order:   Diet Order            Diet regular Room service appropriate? Yes; Fluid consistency: Thin  Diet effective now                 EDUCATION NEEDS:   No education needs have been identified at this time  Skin:  Skin Assessment: Skin Integrity Issues: Incisions: head, left shoulder  Last BM:  06/27/20  Height:   Ht Readings from Last 1 Encounters:  06/21/20 5\' 7"  (1.702 m)    Weight:   Wt Readings from Last 1 Encounters:  06/21/20 77.1 kg    Ideal Body Weight:  67.3 kg  BMI:  Body mass index is 26.63 kg/m.  Estimated Nutritional Needs:   Kcal:   1900-2100  Protein:  110-115 grams  Fluid:  >/= 1.9 L    08/21/20, MS, RD, LDN Inpatient Clinical Dietitian Please see [facility] for contact information.

## 2020-06-29 ENCOUNTER — Inpatient Hospital Stay (HOSPITAL_COMMUNITY): Payer: Medicare Other | Admitting: Speech Pathology

## 2020-06-29 ENCOUNTER — Inpatient Hospital Stay (HOSPITAL_COMMUNITY): Payer: Medicare Other | Admitting: Physical Therapy

## 2020-06-29 ENCOUNTER — Inpatient Hospital Stay (HOSPITAL_COMMUNITY): Payer: Medicare Other

## 2020-06-29 ENCOUNTER — Inpatient Hospital Stay (HOSPITAL_COMMUNITY): Payer: Medicare Other | Admitting: Occupational Therapy

## 2020-06-29 MED ORDER — TRAZODONE HCL 50 MG PO TABS
100.0000 mg | ORAL_TABLET | Freq: Every day | ORAL | Status: DC
  Administered 2020-06-29 – 2020-06-30 (×2): 100 mg via ORAL
  Filled 2020-06-29 (×2): qty 2

## 2020-06-29 NOTE — Progress Notes (Signed)
Physical Therapy Session Note  Patient Details  Name: Reginald Jenkins MRN: [id number] Date of Birth: 01-Mar-1946  Today's Date: 06/29/2020 PT Individual Time: 1300-1345 PT Individual Time Calculation (min): 45 min   Short Term Goals: Week 1:  PT Short Term Goal 1 (Week 1): Pt will perform bed mobility with CGA PT Short Term Goal 2 (Week 1): Pt will perform bed to chair transfer with CGA PT Short Term Goal 3 (Week 1): Pt will ambulate 150' with CGA and LRAD  Skilled Therapeutic Interventions/Progress Updates:    Pt received sitting in recliner, wife at bedside, pt agreeable to PT session. Pt performed sit<>stand from recliner height with CGA and no AD, ambulated from room to therapy gym with CGA and no AD, demo's forward weight shift with forward flexed trunk, cues for paced activity and upright posture. Pt navigated up/down x12 steps with CGA and B HR support, reciprocal pattern during ascent, step-to fur descent. After seated rest, pt performed boxing with punching bag and boxing gloves, therapist providing minA due to occasional LOB posteriorly with punches. Cues for large/big movements which appeared to improve his efficiency. Pt then performed 4 square step test: trial 1 23 sec, trial 2: 22 sec, trial 3: 20 sec. Pt performed TUG scoring: trial 14 sec, trial 2: 12 sec, trial 3: 11 sec. Pt performed DGI, scoring 14/24 indicating increased falls risk. Pt ambulated back to his room with CGA and no AD, instructed to locate and find his room, required mod cues for locating successfully. Pt ended session seated in recliner, wife at bedside, needs in reach.   Therapy Documentation Precautions:  Precautions Precautions: Fall Precaution Comments: wife denies fall history  Restrictions Weight Bearing Restrictions: No Vital Signs: Therapy Vitals Temp: 99.6 F (37.6 C) Temp Source: Oral Pulse Rate: 79 Resp: 18 BP: 109/67 Patient Position (if appropriate): Sitting Oxygen Therapy SpO2: 100  % O2 Device: Room Air Pain: Pain Assessment Pain Scale: 0-10 Pain Score: 0-No pain  Therapy/Group: Individual Therapy  Amilliana Hayworth P Minal Stuller PT 06/29/2020, 3:34 PM

## 2020-06-29 NOTE — Progress Notes (Signed)
Physical Therapy Session Note  Patient Details  Name: Reginald Jenkins MRN: [id number] Date of Birth: 06-Apr-1946  Today's Date: 06/29/2020 PT Individual Time: 1445-1515 PT Individual Time Calculation (min): 30 min   Short Term Goals: Week 1:  PT Short Term Goal 1 (Week 1): Pt will perform bed mobility with CGA PT Short Term Goal 2 (Week 1): Pt will perform bed to chair transfer with CGA PT Short Term Goal 3 (Week 1): Pt will ambulate 150' with CGA and LRAD  Skilled Therapeutic Interventions/Progress Updates: Pt presents sitting in recliner and agreeable to participate in therapy.  Pt performs sit to stand w/ supervision.  Pt amb up to 250' w/ RW into gym.  Pt improved festinating gait w/ verbal counting.  Pt somewhat impulsive w/ RW pushing it away when approaching seated surface.  Pt amb w/o AD and CGA, w/ improved step length and verbal cues for posture and counting.  Pt performed negotiation of ramped surface and into mulch pit w/ CGA w/o using arm rails.  Pt amb holding small and then large T-ball w/o LOB.   Pt returned to room and sitting in recliner.  Spouse present and will remain w/ pt.  Spouse asked about still walking w/ RW and suggested to continue at this time.     Therapy Documentation Precautions:  Precautions Precautions: Fall Precaution Comments: wife denies fall history  Restrictions Weight Bearing Restrictions: No General:   Vital Signs: Therapy Vitals Temp: 99.6 F (37.6 C) Temp Source: Oral Pulse Rate: 79 Resp: 18 BP: 109/67 Patient Position (if appropriate): Sitting Oxygen Therapy SpO2: 100 % O2 Device: Room Air Pain:0/10 Pain Assessment Pain Scale: 0-10 Pain Score: 0-No pain Mobility:   Locomotion :    Trunk/Postural Assessment :    Balance: Balance Balance Assessed: Yes Standardized Balance Assessment Standardized Balance Assessment: Dynamic Gait Index Dynamic Gait Index Level Surface: Mild Impairment Change in Gait Speed: Mild  Impairment Gait with Horizontal Head Turns: Mild Impairment Gait with Vertical Head Turns: Mild Impairment Gait and Pivot Turn: Moderate Impairment Step Over Obstacle: Moderate Impairment Step Around Obstacles: Mild Impairment Steps: Mild Impairment Total Score: 14 Exercises:   Other Treatments:      Therapy/Group: Individual Therapy  Lucio Edward 06/29/2020, 3:43 PM

## 2020-06-29 NOTE — Plan of Care (Signed)
°  Problem: Consults Goal: RH GENERAL PATIENT EDUCATION Description: See Patient Education module for education specifics. Outcome: Progressing Goal: Skin Care Protocol Initiated - if Braden Score 18 or less Description: If consults are not indicated, leave blank or document N/A Outcome: Progressing Goal: Nutrition Consult-if indicated Outcome: Progressing   Problem: RH BOWEL ELIMINATION Goal: RH STG MANAGE BOWEL WITH ASSISTANCE Description: STG Manage Bowel with min Assistance. Outcome: Progressing Goal: RH STG MANAGE BOWEL W/MEDICATION W/ASSISTANCE Description: STG Manage Bowel with Medication with min Assistance. Outcome: Progressing   Problem: RH BLADDER ELIMINATION Goal: RH STG MANAGE BLADDER WITH ASSISTANCE Description: STG Manage Bladder With min Assistance Outcome: Progressing   Problem: RH SKIN INTEGRITY Goal: RH STG SKIN FREE OF INFECTION/BREAKDOWN Description: Skin to remain free of breakdown while on rehab with min assist. Outcome: Progressing Goal: RH STG MAINTAIN SKIN INTEGRITY WITH ASSISTANCE Description: STG Maintain Skin Integrity With min Assistance. Outcome: Progressing Goal: RH STG ABLE TO PERFORM INCISION/WOUND CARE W/ASSISTANCE Description: STG Able To Perform Incision/Wound Care With min Assistance. Outcome: Progressing   Problem: RH SAFETY Goal: RH STG ADHERE TO SAFETY PRECAUTIONS W/ASSISTANCE/DEVICE Description: STG Adhere to Safety Precautions With min Assistance and appropriate assistive Device. Outcome: Progressing   Problem: RH PAIN MANAGEMENT Goal: RH STG PAIN MANAGED AT OR BELOW PT'S PAIN GOAL Description: <4 on a 0-10 pain scale Outcome: Progressing   Problem: RH KNOWLEDGE DEFICIT GENERAL Goal: RH STG INCREASE KNOWLEDGE OF SELF CARE AFTER HOSPITALIZATION Description: Patient and caregiver will be able to demonstrate knowledge of medication management, skin care, mobility safety, and follow up care with the MD post discharge from CIR with  min assist from staff. Outcome: Progressing

## 2020-06-29 NOTE — Progress Notes (Signed)
Occupational Therapy Session Note  Patient Details  Name: Reginald Jenkins MRN: [id number] Date of Birth: 1946-07-29  Today's Date: 06/29/2020 OT Individual Time: 2505-3976 OT Individual Time Calculation (min): 71 min    Short Term Goals: Week 2:  OT Short Term Goal 1 (Week 2): Pt will perform 3/3 toileting tasks with CS and Min VCs OT Short Term Goal 2 (Week 2): Pt will bathe body parts with MOD VC to demo improved sequencing OT Short Term Goal 3 (Week 2): Pt will perform LB dressing with occasional VCs and CS for balance OT Short Term Goal 4 (Week 2): Pt will consistently perform ADL transfers with CS and safe use of AD  Skilled Therapeutic Interventions/Progress Updates:    Pt greeted at time of session sitting up in recliner with wife present, agreeable to OT session. Ambulated to sink CS/CGA with hand held assist where he completed oral hygiene CGA for safety during standing tasks and putting on deodorant in the same manner. Ambulated to ortho gym with RW CS/CGA with cues for larger steps, transferred to mat in same manner. Visual scanning/sequencing task of erasing numbers 1-10 and 1-15 in numerical order with improved skill performance with 1-10 but due to performing trail making in between sets had difficulty with termination of task. Standing dynavision activity for 2 rounds of 3 minutes with crossing midline and visually scanning to find buttons, Mod cues required initially decreasing to Min for second round. SCIFIT level 2 in standing for 2 five minute intervals with one rest break in between for both reciprocal movement and to improve endurance. Ambulated back to room with CGA/CS with RW and transferred to recliner in same manner. Min/Mod verbal cues throughout session for sequencing/problem solving but is performing better with familiar tasks. Pt with wife in recliner, she is approved to perform mobility with the pt to supervise.   Therapy Documentation Precautions:   Precautions Precautions: Fall Precaution Comments: wife denies fall history  Restrictions Weight Bearing Restrictions: No     Therapy/Group: Individual Therapy  Erasmo Score 06/29/2020, 10:13 AM

## 2020-06-29 NOTE — Progress Notes (Signed)
Speech Language Pathology Daily Session Note  Patient Details  Name: Reginald Jenkins MRN: [id number] Date of Birth: 02-23-1946  Today's Date: 06/29/2020 SLP Individual Time: 0805-0900 SLP Individual Time Calculation (min): 55 min  Short Term Goals: Week 2: SLP Short Term Goal 1 (Week 2): Patient will initiate verbal requests for specific wants/needs with min A cues. SLP Short Term Goal 2 (Week 2): Patient will complete basic level functional problem solving tasks with moderate A and cues. SLP Short Term Goal 3 (Week 2): Patient will maintain attention to task for 4-5 minutes with minimal frequency of cues to redirect. SLP Short Term Goal 4 (Week 2): Patient will utilize visual aids (calendar,etc) to orient to time, place, situation with min A cues. SLP Short Term Goal 5 (Week 2): Patient will demonstrate adequate safety awareness and sequencing when performing ADL's and/or simulated ADL's (while seated) with mod A.  Skilled Therapeutic Interventions:    Patient seen for skilled ST treatment to address cognitive-linguistic goals. Patient ambulated with walker to large open room with supervision A, but did require verbal cues to continue using walker when approaching table. Patient was oriented to month and year without assistance. He was able to inform MD of his difficulty sleeping last night (this was confirmed by wife after session). He participated in constructive problem solving and attention task to match pattern of plastic tubes to that on picture. He initially required maximal cues (verbal, visual) to match picture to physical piece, but this improved to moderate with frequent trials. Patient only exhibited 2-3 instances of tangential unrelated topic introduction but overall his sustained attention for this task was very good with minimal cues for redirection. Patient continues to benefit from skilled ST services to maximize potential and reduce caregiver burden prior to discharge.   Pain Pain Assessment Pain Scale: 0-10 Pain Score: 0-No pain  Therapy/Group: Individual Therapy  Angela Nevin, MA, CCC-SLP 06/29/20 12:21 PM

## 2020-06-29 NOTE — Progress Notes (Signed)
[place] PHYSICAL MEDICINE & REHABILITATION PROGRESS NOTE   Subjective/Complaints:   Pt reports his sleep was a little rocky last night-  Per SLP, more with it- knew month and year, and where he was/why here.   No nursing notes from last night about sleep.   WUJ:[id number] by cognition Objective:   No results found. Recent Labs    06/27/20 0700  WBC 6.4  HGB 12.8*  HCT 39.2  PLT 297   Recent Labs    06/27/20 0700  NA 137  K 4.2  CL 101  CO2 27  GLUCOSE 123*  BUN 13  CREATININE 0.71  CALCIUM 9.5    Intake/Output Summary (Last 24 hours) at 06/29/2020 0919 Last data filed at 06/28/2020 2015 Gross per 24 hour  Intake 474 ml  Output --  Net 474 ml     Physical Exam: Vital Signs Blood pressure 132/79, pulse 79, temperature 98.5 F (36.9 C), temperature source Oral, resp. rate 16, height 5\' 7"  (1.702 m), weight 77.1 kg, SpO2 100 %.   Gen: awake, Ox3- this AM- doing much better- sitting up doing puzzles with SLP, NAD HENT: healing incisions on head- staples out- sensitive to touch per pt- still CV: RRR Resp: CTA B/L- no W/R/R- good air movement GI: Soft, NT, ND, (+)BS   Neurological: Ox3- remembers month/year, just not day/date Mild dysarthria  He does have a resting tremor esp in RUE- fast tremor- constantly- no changeMotor: Grossly 5/5 throughout Psychiatric:  Comments: joking some, wife not there this AM since in SLP    Assessment/Plan: 1. 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  Physiatrist is providing close team supervision and 24 hour management of active medical problems listed below.  Physiatrist and rehab team continue to assess barriers to discharge/monitor patient progress toward functional and medical goals  Care Tool:  Bathing    Body parts bathed by patient: Chest,  Abdomen, Front perineal area, Face, Right arm, Left arm, Buttocks, Right upper leg, Left upper leg, Right lower leg, Left lower leg   Body parts bathed by helper: Right arm, Left arm, Chest, Abdomen, Front perineal area, Buttocks, Right upper leg, Right lower leg, Left upper leg, Left lower leg, Face     Bathing assist Assist Level: Contact Guard/Touching assist     Upper Body Dressing/Undressing Upper body dressing   What is the patient wearing?: Pull over shirt    Upper body assist Assist Level: Minimal Assistance - Patient > 75%    Lower Body Dressing/Undressing Lower body dressing      What is the patient wearing?: Underwear/pull up, Pants     Lower body assist Assist for lower body dressing: Contact Guard/Touching assist     Toileting Toileting    Toileting assist Assist for toileting: Moderate Assistance - Patient 50 - 74%     Transfers Chair/bed transfer  Transfers assist     Chair/bed transfer assist level: Contact Guard/Touching assist Chair/bed transfer assistive device:   Ambulation assist      Assist level: Contact Guard/Touching assist Assistive device: No Device Max distance: +300'   Walk 10 feet activity   Assist     Assist level: Contact Guard/Touching assist Assistive device: No Device   Walk 50 feet activity   Assist    Assist level: Contact Guard/Touching assist Assistive device: No Device  Walk 150 feet activity   Assist    Assist level: Contact Guard/Touching assist Assistive device: No Device    Walk 10 feet on uneven surface  activity   Assist     Assist level: Minimal Assistance - Patient > 75%     Wheelchair     Assist Will patient use wheelchair at discharge?: No             Wheelchair 50 feet with 2 turns activity    Assist            Wheelchair 150 feet activity     Assist          Blood pressure 132/79, pulse 79, temperature 98.5 F  (36.9 C), temperature source Oral, resp. rate 16, height 5\' 7"  (1.702 m), weight 77.1 kg, SpO2 100 %.  Medical Problem List and Plan: 1.Decreased functional mobilitysecondary to Parkinson's disease [facility]/delirium -patientmay notshower  8/13- will d/c staples- and then pt can shower  8/16- pt can shower- will remind OT -ELOS/Goals: 6-9 days/Mod I/supervision Continue CIR 2. Antithrombotics:SCDs -DVT/anticoagulation:  -antiplatelet therapy: Aspirin 81 mg daily 3. Pain Management/chronicbackpain:Currently on home regimen of oxycodone 15 mg 4 times daily, Flexeril 10 mg 3 times daily as needed. Monitor mental status  8/12- c/o no pain this AM- nursing went in after I did to give AM meds.   8/13- will decrease to TID per wife's request- if tolerates well, Monday will decrease to BID  8/15: well controlled  8/16- reduce to BID per wife request Monitor with increased exertion.  8/18- per staff, hasn't received any pain meds since last week/Friday- fyi  8/19- no meds given lately.  4. Mood:Ambien as prior to admission decreased to 5 mg nightly -antipsychotic agents: Seroquel 12.5 mg nightly as needed  8/15: Posey bed discontinued as worsens patient's agitation. Wife understanding of risks.    8/16- pt still very agitated/trying to get out of bed all night- even with wife there.   8/17- will increase Seroquel to 25 mg QHS prn and will d/c ambien- add Trazodone 75 mg QHS and also schedule trazodone and seroquel to be given at 8pm- also can repeat seroquel at night as needed x1  8/18- slept all night- much more with it today- stopped Ambien- suggest not take it in future  8/19- will increase trazodone tonight to help with sleep- has another seroquel prn if needed- increased trazodone to 100 mg QHS  5. Neuropsych: This  patientisNOTcapable of making decisions on hisown behalf. 6. Skin/Wound Care:Routine skin checks  Staples removed 8/13, healing well 7. Fluids/Electrolytes/Nutrition:Routine in and outs. CMP ordered for tomorrow a.m.  8/16- labs in AM- didn't get Monday 8.Parkinson's disease. Continue Sinemet 25-100 mg 4 times daily as well as Artane 2 mg 3 times daily 9. Hypertension. Lisinopril 5 mg daily.  Monitor with increased mobility. Well controlled  8/19- BP controlled- con't meds 10. B12 deficiency. Complete 7-daycourse of vitamin B12 1000 mcg daily x7 days 11. Hypothyroidism. Continue Synthroid       LOS: 8 days A FACE TO FACE EVALUATION WAS PERFORMED  Kaleiyah Polsky 06/29/2020, 9:19 AM

## 2020-06-30 ENCOUNTER — Inpatient Hospital Stay (HOSPITAL_COMMUNITY): Payer: Medicare Other

## 2020-06-30 ENCOUNTER — Inpatient Hospital Stay (HOSPITAL_COMMUNITY): Payer: Medicare Other | Admitting: Speech Pathology

## 2020-06-30 ENCOUNTER — Inpatient Hospital Stay (HOSPITAL_COMMUNITY): Payer: Medicare Other | Admitting: Occupational Therapy

## 2020-06-30 MED ORDER — QUETIAPINE FUMARATE 50 MG PO TABS: 50.0000 mg | ORAL_TABLET | Freq: Every evening | ORAL | Status: DC | PRN

## 2020-06-30 MED ORDER — QUETIAPINE FUMARATE 50 MG PO TABS
50.0000 mg | ORAL_TABLET | Freq: Every day | ORAL | Status: DC
  Administered 2020-06-30: 50 mg via ORAL
  Filled 2020-06-30: qty 1

## 2020-06-30 NOTE — Progress Notes (Signed)
Physical Therapy Session Note  Patient Details  Name: Reginald Jenkins MRN: [id number] Date of Birth: Jul 01, 1946  Today's Date: 06/30/2020 PT Individual Time: 1303-1405 PT Individual Time Calculation (min): 62 min   Short Term Goals: Week 1:  PT Short Term Goal 1 (Week 1): Pt will perform bed mobility with CGA PT Short Term Goal 2 (Week 1): Pt will perform bed to chair transfer with CGA PT Short Term Goal 3 (Week 1): Pt will ambulate 150' with CGA and LRAD  Skilled Therapeutic Interventions/Progress Updates:    Session focused on neuro re-ed for balance re-training and coordination, functional gait training on unit and in household environment without AD to increase challenge, and education with wife in regards to ongoing mobility/exercises upon d/c.  Pt performs basic transfers throughout session with close supervision to CGA, impulsive at times and decreased safety awareness noted. Focused on several higher level balance activities with minimal to no UE support including functional reaching on compliant surfaces, standing heel raises on compliant surfaces, standing on balance board with L<> R and A<>P weightshifting, standing on Kinetron with focus on maintaining equal weightbearing, and cornhole toss while on foam wedge. Pt requires CGA to min assist overall for balance throughout session with verbal cues for attention and awareness of obstacles. Pt does require cues for upright trunk throughout as well. Dynamic gait through obstacle course with min assist overall and cues for obstacle negotiation and during gait while kicking a yoga block to focus on coordination and quick starts/stops during mobility.   Administered FTSS for fall risk assessment: Five times Sit to Stand Test (FTSS) Method: Use a straight back chair with a solid seat that is 16-18 high. Ask participant to sit on the chair with arms folded across their chest.   Instructions: Stand up and sit down as quickly as possible 5  times, keeping your arms folded across your chest.   Measurement: Stop timing when the participant stands the 5th time.  TIME: __16____ (in seconds)  Times > 13.6 seconds is associated with increased disability and morbidity (Guralnik, 2000) Times > 15 seconds is predictive of recurrent falls in healthy individuals aged 60 and older (Buatois, et al., 2008) Normal performance values in community dwelling individuals aged 42 and older (Bohannon, 2006): o 60-69 years: 11.4 seconds o 70-79 years: 12.6 seconds o 80-89 years: 14.8 seconds  MCID: ? 2.3 seconds for Vestibular Disorders Wray Kearns, 2006)   Therapy Documentation Precautions:  Precautions Precautions: Fall Precaution Comments: wife denies fall history  Restrictions Weight Bearing Restrictions: No    Pain:  No reports of pain.    Therapy/Group: Individual Therapy  Karolee Stamps Darrol Poke, PT, DPT, CBIS  06/30/2020, 2:09 PM

## 2020-06-30 NOTE — Progress Notes (Signed)
[place] PHYSICAL MEDICINE & REHABILITATION PROGRESS NOTE   Subjective/Complaints:   No notes from nursing, however wife reports pt slept a total of 3 hrs last night- wasn't agitated, just didn't want to sit/stay in bed- wanted to do "things".   Wife is surprised how well head looks after surgery.   [id number] cognition-  Pt denies SOB, abd pain, CP, N/V/C/D, and vision changes  Objective:   No results found. No results for input(s): WBC, HGB, HCT, PLT in the last 72 hours. No results for input(s): NA, K, CL, CO2, GLUCOSE, BUN, CREATININE, CALCIUM in the last 72 hours.  Intake/Output Summary (Last 24 hours) at 06/30/2020 0853 Last data filed at 06/29/2020 2226 Gross per 24 hour  Intake 696 ml  Output --  Net 696 ml     Physical Exam: Vital Signs Blood pressure 137/72, pulse 84, temperature 98.1 F (36.7 C), temperature source Oral, resp. rate 18, height 5\' 7"  (1.702 m), weight 77.1 kg, SpO2 100 %.   Gen: awake, Ox3; wife at bedside; more appropriate, NAD HENT: healing incisions on head- almost cannot see now CV: RRR Resp: CTA B/L- no W/R/R- good air movement GI: Soft, NT, ND, (+)BS   Neurological: Ox3- remembers didn't sleep last night- able to share more information Mild dysarthria  He does have a resting tremor esp in RUE- fast tremor- constantly- no changeMotor: Grossly 5/5 throughout Psychiatric:  Comments: calm affect    Assessment/Plan: 1. 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  Physiatrist is providing close team supervision and 24 hour management of active medical problems listed below.  Physiatrist and rehab team continue to assess barriers to discharge/monitor patient progress toward functional and medical goals  Care Tool:  Bathing    Body parts bathed by patient: Chest,  Abdomen, Front perineal area, Face, Right arm, Left arm, Buttocks, Right upper leg, Left upper leg, Right lower leg, Left lower leg   Body parts bathed by helper: Right arm, Left arm, Chest, Abdomen, Front perineal area, Buttocks, Right upper leg, Right lower leg, Left upper leg, Left lower leg, Face     Bathing assist Assist Level: Contact Guard/Touching assist     Upper Body Dressing/Undressing Upper body dressing   What is the patient wearing?: Pull over shirt    Upper body assist Assist Level: Minimal Assistance - Patient > 75%    Lower Body Dressing/Undressing Lower body dressing      What is the patient wearing?: Underwear/pull up, Pants     Lower body assist Assist for lower body dressing: Contact Guard/Touching assist     Toileting Toileting    Toileting assist Assist for toileting: Moderate Assistance - Patient 50 - 74%     Transfers Chair/bed transfer  Transfers assist     Chair/bed transfer assist level: Contact Guard/Touching assist Chair/bed transfer assistive device:   Ambulation assist      Assist level: Contact Guard/Touching assist Assistive device: No Device Max distance: 250   Walk 10 feet activity   Assist     Assist level: Contact Guard/Touching assist Assistive device: No Device   Walk 50 feet activity   Assist    Assist level: Contact Guard/Touching assist Assistive device: No Device    Walk 150 feet activity   Assist    Assist level: Contact Guard/Touching assist Assistive device: No Device  Walk 10 feet on uneven surface  activity   Assist     Assist level: Minimal Assistance - Patient > 75%     Wheelchair     Assist Will patient use wheelchair at discharge?: No             Wheelchair 50 feet with 2 turns activity    Assist            Wheelchair 150 feet activity     Assist          Blood pressure 137/72, pulse 84, temperature 98.1 F  (36.7 C), temperature source Oral, resp. rate 18, height 5\' 7"  (1.702 m), weight 77.1 kg, SpO2 100 %.  Medical Problem List and Plan: 1.Decreased functional mobilitysecondary to Parkinson's disease [facility]/delirium -patientmay notshower  8/13- will d/c staples- and then pt can shower  8/16- pt can shower- will remind OT  8/20- head looking great- almost healed -ELOS/Goals: 6-9 days/Mod I/supervision Continue CIR 2. Antithrombotics:SCDs -DVT/anticoagulation:  -antiplatelet therapy: Aspirin 81 mg daily 3. Pain Management/chronicbackpain:Currently on home regimen of oxycodone 15 mg 4 times daily, Flexeril 10 mg 3 times daily as needed. Monitor mental status 8/20- meds being held due to no complaints of pain/orientation 4. Mood:Ambien as prior to admission decreased to 5 mg nightly -antipsychotic agents: Seroquel 12.5 mg nightly as needed  8/20- will increase Seroquel to 50 mg QHS and keep another seroquel 50 mg QHS prn- also maintain trazodone 100 mg QHS and keep off Ambien  5. Neuropsych: This patientisNOTcapable of making decisions on hisown behalf. 6. Skin/Wound Care:Routine skin checks  Staples removed 8/13, healing well 7. Fluids/Electrolytes/Nutrition:Routine in and outs. CMP ordered for tomorrow a.m.  8/20- labs looks OK 8.Parkinson's disease. Continue Sinemet 25-100 mg 4 times daily as well as Artane 2 mg 3 times daily 9. Hypertension. Lisinopril 5 mg daily.  Monitor with increased mobility. Well controlled  8/20- BP controlled- con't regimen 10. B12 deficiency. Complete 7-daycourse of vitamin B12 1000 mcg daily x7 days 11. Hypothyroidism. Continue Synthroid       LOS: 9 days A FACE TO FACE EVALUATION WAS PERFORMED  Chrysta Fulcher 06/30/2020, 8:53 AM

## 2020-06-30 NOTE — Progress Notes (Signed)
Physical Therapy Weekly Progress Note  Patient Details  Name: Reginald Jenkins MRN: [id number] Date of Birth: November 09, 1946  Beginning of progress report period: June 22, 2020 End of progress report period: June 30, 2020  Today's Date: 06/30/2020 PT Individual Time: 0802-0859 PT Individual Time Calculation (min): 57 min   Patient has met 3 of 3 short term goals.  Pt is progressing very well toward functional mobility goals, improving independence in all areas of mobility. Pt is now performing bed mobility with supervision, sit to stand and ambulation with CGA. Pt has demonstrated improvements in cognition as well as carryover of PT cuing and education. Pt demonstrates deficits in dynamic standing balance, ambulation, and endurance. Subsequent session to focus on these areas to prepare pt for DC.  Patient continues to demonstrate the following deficits muscle weakness, decreased cardiorespiratoy endurance, decreased problem solving, decreased safety awareness and decreased memory and decreased standing balance, decreased postural control and decreased balance strategies and therefore will continue to benefit from skilled PT intervention to increase functional independence with mobility.  Patient progressing toward long term goals..  Continue plan of care.  PT Short Term Goals Week 1:  PT Short Term Goal 1 (Week 1): Pt will perform bed mobility with CGA PT Short Term Goal 1 - Progress (Week 1): Met PT Short Term Goal 2 (Week 1): Pt will perform bed to chair transfer with CGA PT Short Term Goal 2 - Progress (Week 1): Met PT Short Term Goal 3 (Week 1): Pt will ambulate 150' with CGA and LRAD PT Short Term Goal 3 - Progress (Week 1): Met Week 2:  PT Short Term Goal 1 (Week 2): STGs=LTGs due to ELOS  Skilled Therapeutic Interventions/Progress Updates:     Pt received seated in recliner with wife present. Reports some soreness in low back. Number not provided. PT provides rest breaks and  mobility to manage pain. WC transport outside for gait training in open environment. Pt ambulates bouts of >300' without AD and with CGA from PT, ambulating over varying surfaces such as brick and cement, up and down ramps, up/down 8 steps with LHR, over grass and uneven terrain and in and out of obstacles. Pt has most difficulty with quick direction changes but does not have any overt LOBs. Multiple rest breaks taken, seated in WC.   WC transport back to room. Pt left seated in recliner with wife present and all needs within reach.  Therapy Documentation Precautions:  Precautions Precautions: Fall Precaution Comments: wife denies fall history  Restrictions Weight Bearing Restrictions: No   Therapy/Group: Individual Therapy  Breck Coons, PT, DPT 06/30/2020, 9:04 AM

## 2020-06-30 NOTE — Progress Notes (Signed)
Speech Language Pathology Daily Session Note  Patient Details  Name: Reginald Jenkins MRN: [id number] Date of Birth: [birth year]  Today's Date: 06/30/2020 SLP Individual Time: 0900-1000 SLP Individual Time Calculation (min): 60 min  Short Term Goals: Week 2: SLP Short Term Goal 1 (Week 2): Patient will initiate verbal requests for specific wants/needs with min A cues. SLP Short Term Goal 2 (Week 2): Patient will complete basic level functional problem solving tasks with moderate A and cues. SLP Short Term Goal 3 (Week 2): Patient will maintain attention to task for 4-5 minutes with minimal frequency of cues to redirect. SLP Short Term Goal 4 (Week 2): Patient will utilize visual aids (calendar,etc) to orient to time, place, situation with min A cues. SLP Short Term Goal 5 (Week 2): Patient will demonstrate adequate safety awareness and sequencing when performing ADL's and/or simulated ADL's (while seated) with mod A.  Skilled Therapeutic Interventions:   Patient seen for skilled ST therapeutic intervention to address cognitive-linguistic goals. When asked about the date, he promptly replied, "8-20" then slight pause before saying "2021". He informed SLP that previous night he slept well "for three hours then she tells me (referring to his wife) that I was antsy". Wife confirmed this and stated that patient was becoming confused and agitated and wanting to try to "fix things" in the room. During session, patient was able to perform construction task completed yesterday with improved attention and performance but continues to require moderate A cues to redirect attention and to alternate attention. Patient demonstrated supervision level A for safety with ambulating and performing chair to stand transfers with walker. At one point he abruptly stood up but when asked he was able to give specific statement regarding reason saying, "I need to sanitize all this" (he wanted to clean the materials we had just  used). Patient continues to benefit from skilled ST to maximize cognitive function and reduce burden of care prior to discharge.  Pain Pain Assessment Pain Scale: 0-10 Pain Score: 0-No pain  Therapy/Group: Individual Therapy   Angela Nevin, MA, CCC-SLP Speech Therapy

## 2020-06-30 NOTE — Plan of Care (Signed)
  Problem: Consults °Goal: RH GENERAL PATIENT EDUCATION °Description: See Patient Education module for education specifics. °Outcome: Progressing °Goal: Skin Care Protocol Initiated - if Braden Score 18 or less °Description: If consults are not indicated, leave blank or document N/A °Outcome: Progressing °Goal: Nutrition Consult-if indicated °Outcome: Progressing °  °Problem: RH BOWEL ELIMINATION °Goal: RH STG MANAGE BOWEL WITH ASSISTANCE °Description: STG Manage Bowel with min Assistance. °Outcome: Progressing °Goal: RH STG MANAGE BOWEL W/MEDICATION W/ASSISTANCE °Description: STG Manage Bowel with Medication with min Assistance. °Outcome: Progressing °  °Problem: RH BLADDER ELIMINATION °Goal: RH STG MANAGE BLADDER WITH ASSISTANCE °Description: STG Manage Bladder With min Assistance °Outcome: Progressing °  °Problem: RH SKIN INTEGRITY °Goal: RH STG SKIN FREE OF INFECTION/BREAKDOWN °Description: Skin to remain free of breakdown while on rehab with min assist. °Outcome: Progressing °Goal: RH STG MAINTAIN SKIN INTEGRITY WITH ASSISTANCE °Description: STG Maintain Skin Integrity With min Assistance. °Outcome: Progressing °Goal: RH STG ABLE TO PERFORM INCISION/WOUND CARE W/ASSISTANCE °Description: STG Able To Perform Incision/Wound Care With min Assistance. °Outcome: Progressing °  °Problem: RH SAFETY °Goal: RH STG ADHERE TO SAFETY PRECAUTIONS W/ASSISTANCE/DEVICE °Description: STG Adhere to Safety Precautions With min Assistance and appropriate assistive Device. °Outcome: Progressing °  °Problem: RH PAIN MANAGEMENT °Goal: RH STG PAIN MANAGED AT OR BELOW PT'S PAIN GOAL °Description: <4 on a 0-10 pain scale °Outcome: Progressing °  °Problem: RH KNOWLEDGE DEFICIT GENERAL °Goal: RH STG INCREASE KNOWLEDGE OF SELF CARE AFTER HOSPITALIZATION °Description: Patient and caregiver will be able to demonstrate knowledge of medication management, skin care, mobility safety, and follow up care with the MD post discharge from CIR with  min assist from staff. °Outcome: Progressing °  °

## 2020-07-01 MED ORDER — TRAZODONE HCL 50 MG PO TABS
100.0000 mg | ORAL_TABLET | Freq: Every day | ORAL | Status: DC
  Administered 2020-07-01 – 2020-07-04 (×4): 100 mg via ORAL
  Filled 2020-07-01 (×4): qty 2

## 2020-07-01 MED ORDER — DOCUSATE SODIUM 100 MG PO CAPS
100.0000 mg | ORAL_CAPSULE | Freq: Two times a day (BID) | ORAL | Status: DC
  Administered 2020-07-01 – 2020-07-05 (×8): 100 mg via ORAL
  Filled 2020-07-01 (×8): qty 1

## 2020-07-01 MED ORDER — QUETIAPINE FUMARATE 50 MG PO TABS
50.0000 mg | ORAL_TABLET | Freq: Every day | ORAL | Status: DC
  Administered 2020-07-01 – 2020-07-03 (×3): 50 mg via ORAL
  Filled 2020-07-01 (×3): qty 1

## 2020-07-01 MED ORDER — OXYCODONE HCL 5 MG PO TABS: 15.0000 mg | ORAL_TABLET | Freq: Two times a day (BID) | ORAL | Status: DC | PRN

## 2020-07-01 NOTE — Progress Notes (Signed)
Patient slept from 20mn to 5am. Wife at bedside.

## 2020-07-01 NOTE — Progress Notes (Signed)
[place] PHYSICAL MEDICINE & REHABILITATION PROGRESS NOTE   Subjective/Complaints: Slept from 12a-5a. Says seroquel was given at 9pm (ordered for 8pm). Overall making progress in therapies.   ROS: Patient denies fever, rash, sore throat, blurred vision, nausea, vomiting, diarrhea, cough, shortness of breath or chest pain, joint or back pain, headache, or mood change.    Objective:   No results found. No results for input(s): WBC, HGB, HCT, PLT in the last 72 hours. No results for input(s): NA, K, CL, CO2, GLUCOSE, BUN, CREATININE, CALCIUM in the last 72 hours.  Intake/Output Summary (Last 24 hours) at 07/01/2020 1146 Last data filed at 07/01/2020 0700 Gross per 24 hour  Intake 1648 ml  Output 800 ml  Net 848 ml     Physical Exam: Vital Signs Blood pressure 109/69, pulse 84, temperature 98.3 F (36.8 C), resp. rate 18, height 5\' 7"  (1.702 m), weight 77.1 kg, SpO2 97 %.  Constitutional: No distress . Vital signs reviewed. HEENT: EOMI, oral membranes moist Neck: supple Cardiovascular: RRR without murmur. No JVD    Respiratory/Chest: CTA Bilaterally without wheezes or rales. Normal effort    GI/Abdomen: BS +, non-tender, non-distended Ext: no clubbing, cyanosis, or edema Psych: pleasant and cooperative, non-agitated    Neurological: Ox3- discussed sleep, issues from last night, this morning. Mild dysarthria but improving He does have a resting tremor esp in RUE- fast tremor- constantly- no changeMotor: 5/5 throughout      Assessment/Plan: 1. 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  Physiatrist is providing close team supervision and 24 hour management of active medical problems listed below.  Physiatrist and rehab team continue to assess barriers to discharge/monitor patient progress toward functional  and medical goals  Care Tool:  Bathing    Body parts bathed by patient: Chest, Abdomen, Front perineal area, Face, Right arm, Left arm, Buttocks, Right upper leg, Left upper leg, Right lower leg, Left lower leg   Body parts bathed by helper: Right arm, Left arm, Chest, Abdomen, Front perineal area, Buttocks, Right upper leg, Right lower leg, Left upper leg, Left lower leg, Face     Bathing assist Assist Level: Contact Guard/Touching assist     Upper Body Dressing/Undressing Upper body dressing   What is the patient wearing?: Pull over shirt    Upper body assist Assist Level: Minimal Assistance - Patient > 75%    Lower Body Dressing/Undressing Lower body dressing      What is the patient wearing?: Underwear/pull up, Pants     Lower body assist Assist for lower body dressing: Contact Guard/Touching assist     Toileting Toileting    Toileting assist Assist for toileting: Moderate Assistance - Patient 50 - 74%     Transfers Chair/bed transfer  Transfers assist     Chair/bed transfer assist level: Contact Guard/Touching assist Chair/bed transfer assistive device:   Ambulation assist      Assist level: Contact Guard/Touching assist Assistive device: No Device Max distance: +300'   Walk 10 feet activity   Assist     Assist level: Contact Guard/Touching assist Assistive device: No Device   Walk 50 feet activity   Assist    Assist level: Contact Guard/Touching assist Assistive device: No Device    Walk 150 feet activity   Assist    Assist level: Contact Guard/Touching assist Assistive device: No Device  Walk 10 feet on uneven surface  activity   Assist     Assist level: Contact Guard/Touching assist     Wheelchair     Assist Will patient use wheelchair at discharge?: No             Wheelchair 50 feet with 2 turns activity    Assist            Wheelchair 150 feet activity      Assist          Blood pressure 109/69, pulse 84, temperature 98.3 F (36.8 C), resp. rate 18, height 5\' 7"  (1.702 m), weight 77.1 kg, SpO2 97 %.  Medical Problem List and Plan: 1.Decreased functional mobilitysecondary to Parkinson's disease [facility] -patientmay notshower  8/13- will d/c staples- and then pt can shower  8/16- pt can shower- will remind OT  8/20- head looking great- almost healed -ELOS/Goals: 6-9 days/Mod I/supervision Continue CIR 2. Antithrombotics:SCDs -DVT/anticoagulation:  -antiplatelet therapy: Aspirin 81 mg daily 3. Pain Management/chronicbackpain:Currently on home regimen of oxycodone 15 mg 4 times daily, Flexeril 10 mg 3 times daily as needed. Monitor mental status 8/20- meds being held due to no complaints of pain/orientation 4. Mood:Ambien as prior to admission decreased to 5 mg nightly -antipsychotic agents: Seroquel 12.5 mg nightly as needed  8/20- will increase Seroquel to 50 mg QHS and keep another seroquel 50 mg QHS prn- also maintain trazodone 100 mg QHS and keep off Ambien  8/21- did better with increased seroquel. Needs to receive at 8pm as opposed to 9pm (same with trazodone) to help him fall asleep sooner and avoid any AM hangover effect. 5. Neuropsych: This patientisNOTcapable of making decisions on hisown behalf. 6. Skin/Wound Care:Routine skin checks  Staples removed 8/13, healing well 7. Fluids/Electrolytes/Nutrition:Routine in and outs. CMP ordered for tomorrow a.m.  8/20- labs looks OK 8.Parkinson's disease. Continue Sinemet 25-100 mg 4 times daily as well as Artane 2 mg 3 times daily 9. Hypertension. Lisinopril 5 mg daily.  Monitor with increased mobility. Well controlled  8/21- BP controlled- con't regimen 10. B12 deficiency.  Complete 7-daycourse of vitamin B12 1000 mcg daily x7 days 11. Hypothyroidism. Continue Synthroid       LOS: 10 days A FACE TO FACE EVALUATION WAS PERFORMED  9/21 07/01/2020, 11:46 AM

## 2020-07-02 ENCOUNTER — Inpatient Hospital Stay (HOSPITAL_COMMUNITY): Payer: Medicare Other | Admitting: Occupational Therapy

## 2020-07-02 ENCOUNTER — Inpatient Hospital Stay (HOSPITAL_COMMUNITY): Payer: Medicare Other | Admitting: Speech Pathology

## 2020-07-02 DIAGNOSIS — G479 Sleep disorder, unspecified: Secondary | ICD-10-CM

## 2020-07-02 DIAGNOSIS — G20 Parkinson's disease: Secondary | ICD-10-CM

## 2020-07-02 NOTE — Progress Notes (Signed)
[place] PHYSICAL MEDICINE & REHABILITATION PROGRESS NOTE   Subjective/Complaints: Slept quite well last night. Actually received seroquel and trazodone at 8pm. Doesn't feel hung over this morning.   ROS: Patient denies fever, rash, sore throat, blurred vision, nausea, vomiting, diarrhea, cough, shortness of breath or chest pain, joint or back pain, headache, or mood change.    Objective:   No results found. No results for input(s): WBC, HGB, HCT, PLT in the last 72 hours. No results for input(s): NA, K, CL, CO2, GLUCOSE, BUN, CREATININE, CALCIUM in the last 72 hours.  Intake/Output Summary (Last 24 hours) at 07/02/2020 1104 Last data filed at 07/02/2020 0900 Gross per 24 hour  Intake 744 ml  Output 100 ml  Net 644 ml     Physical Exam: Vital Signs Blood pressure 125/71, pulse 66, temperature 98 F (36.7 C), temperature source Oral, resp. rate 18, height 5\' 7"  (1.702 m), weight 77.1 kg, SpO2 100 %.  Constitutional: No distress . Vital signs reviewed. HEENT: EOMI, oral membranes moist Neck: supple Cardiovascular: RRR without murmur. No JVD    Respiratory/Chest: CTA Bilaterally without wheezes or rales. Normal effort    GI/Abdomen: BS +, non-tender, non-distended Ext: no clubbing, cyanosis, or edema Psych: pleasant and cooperative Neurological: Ox3- discussed sleep, issues from last night, this morning. Mild dysarthria but improving. Masked facies, sl rigid. He does have a resting tremor esp in RUE- fast tremor- constantly- no changeMotor: 5/5 throughout      Assessment/Plan: 1. 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  Physiatrist is providing close team supervision and 24 hour management of active medical problems listed below.  Physiatrist and rehab team continue to assess barriers to  discharge/monitor patient progress toward functional and medical goals  Care Tool:  Bathing    Body parts bathed by patient: Chest, Abdomen, Front perineal area, Face, Right arm, Left arm, Buttocks, Right upper leg, Left upper leg, Right lower leg, Left lower leg   Body parts bathed by helper: Right arm, Left arm, Chest, Abdomen, Front perineal area, Buttocks, Right upper leg, Right lower leg, Left upper leg, Left lower leg, Face     Bathing assist Assist Level: Contact Guard/Touching assist     Upper Body Dressing/Undressing Upper body dressing   What is the patient wearing?: Pull over shirt    Upper body assist Assist Level: Minimal Assistance - Patient > 75%    Lower Body Dressing/Undressing Lower body dressing      What is the patient wearing?: Underwear/pull up, Pants     Lower body assist Assist for lower body dressing: Contact Guard/Touching assist     Toileting Toileting    Toileting assist Assist for toileting: Moderate Assistance - Patient 50 - 74%     Transfers Chair/bed transfer  Transfers assist     Chair/bed transfer assist level: Contact Guard/Touching assist Chair/bed transfer assistive device:   Ambulation assist      Assist level: Contact Guard/Touching assist Assistive device: No Device Max distance: +300'   Walk 10 feet activity   Assist     Assist level: Contact Guard/Touching assist Assistive device: No Device   Walk 50 feet activity   Assist    Assist level: Contact Guard/Touching assist Assistive device: No Device    Walk 150 feet activity   Assist    Assist level: Contact Guard/Touching assist  Assistive device: No Device    Walk 10 feet on uneven surface  activity   Assist     Assist level: Contact Guard/Touching assist     Wheelchair     Assist Will patient use wheelchair at discharge?: No             Wheelchair 50 feet with 2 turns  activity    Assist            Wheelchair 150 feet activity     Assist          Blood pressure 125/71, pulse 66, temperature 98 F (36.7 C), temperature source Oral, resp. rate 18, height 5\' 7"  (1.702 m), weight 77.1 kg, SpO2 100 %.  Medical Problem List and Plan: 1.Decreased functional mobilitysecondary to Parkinson's disease [facility]del[facility] -patientmay notshower  8/13- will d/c staples- and then pt can shower  8/16- pt can shower- will remind OT  8/20- head looking great- almost healed -ELOS/Goals: 07/05/20, Mod I/supervision Continue CIR 2. Antithrombotics:SCDs -DVT/anticoagulation:  -antiplatelet therapy: Aspirin 81 mg daily 3. Pain Management/chronicbackpain:Currently on home regimen of oxycodone 15 mg 4 times daily, Flexeril 10 mg 3 times daily as needed. Monitor mental status 8/20- meds being held due to no complaints of pain/orientation 4. Mood:Ambien as prior to admission decreased to 5 mg nightly -antipsychotic agents: Seroquel 12.5 mg nightly as needed  8/20- will increase Seroquel to 50 mg QHS and keep another seroquel 50 mg QHS prn- also maintain trazodone 100 mg QHS and keep off Ambien  8/22- made sure seroquel and trazodone given at 8pm. Pt responded well last night. continue 5. Neuropsych: This patientisNOTcapable of making decisions on hisown behalf. 6. Skin/Wound Care:Routine skin checks  Staples removed 8/13, healing well 7. Fluids/Electrolytes/Nutrition:Routine in and outs. CMP ordered for tomorrow a.m.  8/20- labs looks OK 8.Parkinson's disease. Continue Sinemet 25-100 mg 4 times daily as well as Artane 2 mg 3 times daily 9. Hypertension. Lisinopril 5 mg daily.  Monitor with increased mobility. Well controlled  8/21- BP controlled- con't regimen 10. B12  deficiency. Complete 7-daycourse of vitamin B12 1000 mcg daily x7 days 11. Hypothyroidism. Continue Synthroid       LOS: 11 days A FACE TO FACE EVALUATION WAS PERFORMED  9/21 07/02/2020, 11:04 AM

## 2020-07-02 NOTE — Progress Notes (Signed)
Speech Language Pathology Daily Session Note  Patient Details  Name: ELMO RIO MRN: [id number] Date of Birth: 12-03-1945  Today's Date: 07/02/2020 SLP Individual Time: 0805-0900 SLP Individual Time Calculation (min): 55 min  Short Term Goals: Week 2: SLP Short Term Goal 1 (Week 2): Patient will initiate verbal requests for specific wants/needs with min A cues. SLP Short Term Goal 2 (Week 2): Patient will complete basic level functional problem solving tasks with moderate A and cues. SLP Short Term Goal 3 (Week 2): Patient will maintain attention to task for 4-5 minutes with minimal frequency of cues to redirect. SLP Short Term Goal 4 (Week 2): Patient will utilize visual aids (calendar,etc) to orient to time, place, situation with min A cues. SLP Short Term Goal 5 (Week 2): Patient will demonstrate adequate safety awareness and sequencing when performing ADL's and/or simulated ADL's (while seated) with mod A.  Skilled Therapeutic Interventions:  Pt was seen for skilled ST targeting cognitive goals.  Upon arrival, pt was sitting in recliner with wife at bedside.  Pt reported "broken" sleep last night but was alert and eager to participate in treatment.  SLP facilitated the session with a scavenger hunt task around the unit to address goals for problem solving, safety awareness, and attention.  Pt required mod assist verbal cues for redirection to task as he was noted with tangential verbal output that distracted him from task.  Min cues were needed at times for safety due to slight impulsivity, mostly noted with initiation of movements (specifically standing up).  Pt also benefited from mod assist verbal cues for using environmental aids to locate items as well as for task organization when crossing items off his list once they were found.  After scavenger hunt, SLP facilitated the session with a novel card game.  Pt benefited from min assist faded to supervision verbal cues to plan and execute  a problem solving strategy in a minimally distracting environment.  Upon return to room, SLP discussed distraction management techniques and memory compensatory strategies briefly with pt and his wife per their request.  Pt was left in recliner with wife at bedside.  Continue per current plan of care.    Pain Pain Assessment Pain Scale: 0-10 Pain Score: 0-No pain  Therapy/Group: Individual Therapy  Ahuva Poynor, Melanee Spry 07/02/2020, 12:22 PM

## 2020-07-02 NOTE — Progress Notes (Signed)
Patient stated he slept good. Wife mentioned that he went to sleep after taking his night meds, used the bathroom a few times and slept throughout the night until 4:45 am.

## 2020-07-02 NOTE — Progress Notes (Signed)
Occupational Therapy Session Note  Patient Details  Name: Reginald Jenkins MRN: [id number] Date of Birth: 01-17-1946  Today's Date: 07/02/2020 OT Individual Time: 1334-1415 OT Individual Time Calculation (min): 41 min    Short Term Goals: Week 2:  OT Short Term Goal 1 (Week 2): Pt will perform 3/3 toileting tasks with CS and Min VCs OT Short Term Goal 2 (Week 2): Pt will bathe body parts with MOD VC to demo improved sequencing OT Short Term Goal 3 (Week 2): Pt will perform LB dressing with occasional VCs and CS for balance OT Short Term Goal 4 (Week 2): Pt will consistently perform ADL transfers with CS and safe use of AD   Skilled Therapeutic Interventions/Progress Updates:    Pt greeted at time of session reclined in bed resting with wife present, agreeable to OT session. Discussed upcoming DC, pt and wife have no questions at this time and already have shower seat, no further DME needs from OT. Ambulated with RW CGA/CS to/from gym and participated in standing SCIFIT on level 2.5 for reciprocal movement and proprioceptive input for 5 mins forward/backward with one rest break inbetween, no complaints. Ambulated to other gym and performed standing rebounder activity with green 2kg ball for 2x20 to improve body awareness and standing balance. Ambulated back to room in the same manner and SPT to recliner CS with RW. Alarm on, call bell in reach.    Therapy Documentation Precautions:  Precautions Precautions: Fall Precaution Comments: wife denies fall history  Restrictions Weight Bearing Restrictions: No     Therapy/Group: Individual Therapy  Erasmo Score 07/02/2020, 2:32 PM

## 2020-07-03 ENCOUNTER — Inpatient Hospital Stay (HOSPITAL_COMMUNITY): Payer: Medicare Other

## 2020-07-03 ENCOUNTER — Inpatient Hospital Stay (HOSPITAL_COMMUNITY): Payer: Medicare Other | Admitting: Occupational Therapy

## 2020-07-03 DIAGNOSIS — R5381 Other malaise: Principal | ICD-10-CM

## 2020-07-03 NOTE — Progress Notes (Signed)
Physical Therapy Session Note  Patient Details  Name: Reginald Jenkins MRN: [id number] Date of Birth: Nov 19, 1945  Today's Date: 07/03/2020 PT Individual Time: [id number] and 7741-2878 PT Individual Time Calculation (min): 57 min and 28 min  Short Term Goals: Week 2:  PT Short Term Goal 1 (Week 2): STGs=LTGs due to ELOS  Skilled Therapeutic Interventions/Progress Updates:     1st Session: Pt received seated in [facility] and agreeable to therapy. No pain. WC transport outside for energy conservation and time management. Pt performs ambulation over varying surfaces and grades outside, including brick, cement, and pavement. Pt ambulates bouts of ~500' with CGA and cues for upright gaze, increased stride length, and increased heel strike at initial contact to improve step height and gait pattern. PT also give pt cognitive tasks during ambulation, including naming foods that start with each letter of alphabet. Pt gait pattern deteriorates somewhat when given cognitive tasks, and pt requires cuing for majority of letters, but is able to remain on task throughout session. Pt ambulates x4 bouts total and includes going up/down 8 steps with RHR and cues for step sequencing for safety. WC transport back to room. Left seated with wife and all needs within reach.  2nd Session: Pt received supine in bed and agreeable to therapy. Supine to sit independently. Sit to stand with supervision for safety. Pt ambulates 150' to gym with CGA. PT cues for increased stride length and upright gaze to improve posture and balance.  Pt performs NMR for standing balance, holding 5lb bar with BUEs and performing targeted reaching to hit tossed ball back to PT. PT provides tosses of varying height and laterally in both direction to encourage reaching outside BOS, with cues for increased trunk extension and shoulder activation.   Pt performs reciprocal coordination training, instructed to step on dot on top of platform with RLE touch blue  dot at eye level with LUE, then alternately step on yellow dot on platform with LLE and touch yellow dot at eye level with RUE. Pt requires mod to max verbal cuing to complete task. At times pt performs several sequences correctly, but quickly deteriorates and has difficulty following task. No assistance needed for balance, however.  Pt ambulates back to room, 150'. Left seated in recliner with alarm intact and all needs within reach.  Therapy Documentation Precautions:  Precautions Precautions: Fall Precaution Comments: wife denies fall history  Restrictions Weight Bearing Restrictions: No    Therapy/Group: Individual Therapy  Beau Fanny, PT, DPT 07/03/2020, 4:12 PM

## 2020-07-03 NOTE — Progress Notes (Signed)
Speech Language Pathology Daily Session Note  Patient Details  Name: Reginald Jenkins MRN: [id number] Date of Birth: September 19, 1946  Today's Date: 07/03/2020 SLP Individual Time: 1132-1204 SLP Individual Time Calculation (min): 32 min  Short Term Goals: Week 2: SLP Short Term Goal 1 (Week 2): Patient will initiate verbal requests for specific wants/needs with min A cues. SLP Short Term Goal 2 (Week 2): Patient will complete basic level functional problem solving tasks with moderate A and cues. SLP Short Term Goal 3 (Week 2): Patient will maintain attention to task for 4-5 minutes with minimal frequency of cues to redirect. SLP Short Term Goal 4 (Week 2): Patient will utilize visual aids (calendar,etc) to orient to time, place, situation with min A cues. SLP Short Term Goal 5 (Week 2): Patient will demonstrate adequate safety awareness and sequencing when performing ADL's and/or simulated ADL's (while seated) with mod A.  Skilled Therapeutic Interventions:Skilled ST services focused on education and cognitive skills. Pt was orientated to place and time with mod I use of external cues within room. Pt required supervision A verbal cues from wife pertaining to orientation to situation. Pt was initially unable to recall ST events form yesterday, however given min A verbal cues recall details. SLP educated pt and pt's wife pertaining to recall strategies providing memory handout and use of memory notebook. Pt demonstrated ability to write at sentence level and SLP encouraged pt to self-record events in memory notebook once discharged home to aid in short term recall. All questions answered to satisfaction. SLP recommends to continued services.     Pain Pain Assessment Pain Scale: 0-10 Pain Score: 0-No pain  Therapy/Group: Individual Therapy  Jolinda Pinkstaff  [facility] 07/03/2020, 12:09 PM

## 2020-07-03 NOTE — Progress Notes (Signed)
Occupational Therapy Session Note  Patient Details  Name: Reginald Jenkins MRN: [id number] Date of Birth: 1946-05-19  Today's Date: 07/03/2020 OT Individual Time: 1000-1056 OT Individual Time Calculation (min): 56 min    Short Term Goals: Week 2:  OT Short Term Goal 1 (Week 2): Pt will perform 3/3 toileting tasks with CS and Min VCs OT Short Term Goal 2 (Week 2): Pt will bathe body parts with MOD VC to demo improved sequencing OT Short Term Goal 3 (Week 2): Pt will perform LB dressing with occasional VCs and CS for balance OT Short Term Goal 4 (Week 2): Pt will consistently perform ADL transfers with CS and safe use of AD   Skilled Therapeutic Interventions/Progress Updates:    Pt greeted at time of session reclined in bed resting, wife supervising and pt agreeable to OT session. Per wife pt had already washed up and changed clothes, did not need to use the restroom either. Ambulated without AD to/from gym with CS/ocassional CGA without rest breaks and performed standing SCIFIT on level 2.5 for 5 mins forward/backwards for 10 mins total with no rest break for both reciprocal movement to decrease tremors and to improve standing balance/tolerance. Dynamic standing for dynavision activity with multitasksing, min cues required to hit only red lights but overall pt with good recall during short activity. Dynamic standing with and without blue foam balance pad playing horseshoe game in standing to improve coordination and standing balance, no LOB noted. Ambulated back to room with CS with no AD and no rest breaks, set up in recliner with wife to supervise, alarm on, call bell in reach. No questions at this time as the pt is going home later this week and is excited! Aware of planned ADL session tomorrow for grad day.    Therapy Documentation Precautions:  Precautions Precautions: Fall Precaution Comments: wife denies fall history  Restrictions Weight Bearing Restrictions:  No     Therapy/Group: Individual Therapy  Erasmo Score 07/03/2020, 12:33 PM

## 2020-07-03 NOTE — Discharge Summary (Signed)
Physician Discharge Summary  Patient ID: Reginald Jenkins MRN: [id number] DOB/AGE: 73-Jul-1947 73 y.o.  Admit date: 06/21/2020 Discharge date: 07/05/2020  Discharge Diagnoses:  Principal Problem:   Debility Active Problems:   Parkinson disease (HCC) Pain management Mood stabilization Parkinson's disease Hypertension B12 deficiency  Discharged Condition: Stable  Significant Diagnostic Studies: DG Chest 1 View  Result Date: 06/16/2020 CLINICAL DATA:  Delirium EXAM: CHEST  1 VIEW COMPARISON:  None. FINDINGS: Right-sided ascending stimulator generator. No acute consolidation or effusion. Normal heart size. Aortic atherosclerosis. No pneumothorax. Postsurgical changes at the right humeral head. Left shoulder replacement. IMPRESSION: No active disease. Electronically Signed   By: Jasmine Pang M.D.   On: 06/16/2020 20:29   CT HEAD WO CONTRAST  Result Date: 06/19/2020 CLINICAL DATA:  Follow-up neural stimulator leads and associated bifrontal edema. EXAM: CT HEAD WITHOUT CONTRAST TECHNIQUE: Contiguous axial images were obtained from the base of the skull through the vertex without intravenous contrast. COMPARISON:  06/16/2020 FINDINGS: Brain: Stable bifrontal neural stimulator leads extending into the upper midbrain bilaterally. Low density in the midbrain and pons bilaterally is unchanged. White matter low density surrounding the leads in both cerebral hemispheres is slightly less prominent. No associated mass effect. No intracranial hemorrhage. Ill-defined low density in the right occipital lobe without significant change. Vascular: No hyperdense vessel or unexpected calcification. Skull: Stable bifrontal burr holes. Sinuses/Orbits: Unremarkable. Other: None. IMPRESSION: 1. Stable bifrontal neural stimulator leads extending into the upper midbrain bilaterally. 2. White matter low density surrounding the leads in both cerebral hemispheres is slightly less prominent. 3. Stable ill-defined low density  in the midbrain and pons bilaterally, possibly representing ischemic changes. 4. Stable ill-defined low density in the right occipital lobe, possibly representing an area of subacute infarction. Electronically Signed   By: Beckie Salts M.D.   On: 06/19/2020 19:01   CT Head Wo Contrast  Result Date: 06/16/2020 CLINICAL DATA:  Headache. EXAM: CT HEAD WITHOUT CONTRAST TECHNIQUE: Contiguous axial images were obtained from the base of the skull through the vertex without intravenous contrast. COMPARISON:  None. FINDINGS: Brain: Mild diffuse cortical atrophy is noted. Mild chronic ischemic white matter disease is noted. Bilateral stimulator devices are seen extending from the previously described bifrontal craniotomies with tips in the expected position of the substantia nigra bilaterally. Mild low density is seen involving the subcortical white matter around the stimulator leads in both frontal lobes most consistent with postoperative edema. Ventricular size is within normal limits. No hemorrhage or definite acute infarction is noted. No midline shift is noted. Vascular: No hyperdense vessel or unexpected calcification. Skull: Bilateral frontal craniotomies are noted secondary to stimulator device. Sinuses/Orbits: No acute finding. Other: None. IMPRESSION: Bilateral stimulator devices are seen extending from the previously described bifrontal craniotomies with tips in the expected position of the substantia nigra bilaterally. Mild low density is seen involving the subcortical white matter around the stimulator leads in both frontal lobes most consistent with postoperative edema. No hemorrhage or definite acute infarction is noted. Mild diffuse cortical atrophy is noted. Mild chronic ischemic white matter disease is noted. Electronically Signed   By: Lupita Raider M.D.   On: 06/16/2020 16:11   MR ANGIO HEAD WO CONTRAST  Result Date: 06/21/2020 CLINICAL DATA:  Parkinson's. Deep brain stimulator placement  06/06/2020 in [place]. Postprocedural confusion and irritable behavior. EXAM: MRI HEAD WITHOUT AND WITH CONTRAST MRA HEAD WITHOUT CONTRAST TECHNIQUE: Multiplanar, multiecho pulse sequences of the brain and surrounding structures were obtained without and with  intravenous contrast. Angiographic images of the head were obtained using MRA technique without contrast. CONTRAST:  7mL GADAVIST GADOBUTROL 1 MMOL/ML IV SOLN COMPARISON:  CT head 06/19/2020 FINDINGS: MRI HEAD FINDINGS Brain: Bilateral deep brain stimulators extend through the thalamus and into the midbrain bilaterally. There is moderate edema surrounding both leads similar to that seen on the recent CT. No associated restricted diffusion or hemorrhage in the edema. Following contrast infusion, no abnormal enhancement is seen surrounding the leads to suggest infection. Ventricle size normal. Generalized atrophy. Negative for acute infarct. No mass lesion. There are small posterior subdural hematomas bilaterally in the occipital region. These contain methemoglobin suggesting subacute duration and measure approximately 3 mm each. There is also a small amount of blood extending along the falx posteriorly. Vascular: Normal arterial flow voids Skull and upper cervical spine: No focal skeletal lesion. Sinuses/Orbits: Mild mucosal edema paranasal sinuses. Negative orbit Other: None MRA HEAD FINDINGS Right vertebral artery dominant. Both vertebral arteries contribute to the basilar but mostly on the right. PICA patent bilaterally. Basilar patent. AICA, superior cerebellar, posterior cerebral arteries patent bilaterally fetal origin left posterior cerebral artery. Decreased signal in the right posterior cerebral artery is most likely related to artifact. Internal carotid artery widely patent bilaterally. Anterior and middle cerebral arteries patent bilaterally. Decreased signal in the middle cerebral arteries bilaterally due to artifact. Negative for cerebral  aneurysm. IMPRESSION: 1. Edema surrounding the deep brain stimulator leads bilaterally. No restricted diffusion, hemorrhage, or enhancement to suggest infection around the leads. 2. Small occipital subdural hematoma bilaterally which appears subacute with methemoglobin. 3. Negative for acute infarct. 4. Negative MRA Electronically Signed   By: Marlan Palauharles  Clark M.D.   On: 06/21/2020 15:04   MR BRAIN W WO CONTRAST  Result Date: 06/21/2020 CLINICAL DATA:  Parkinson's. [place] brain [place]. Postprocedural confusion and irritable behavior. EXAM: MRI HEAD WITHOUT AND WITH CONTRAST MRA HEAD WITHOUT CONTRAST TECHNIQUE: Multiplanar, multiecho pulse sequences of the brain and surrounding structures were obtained without and with intravenous contrast. Angiographic images of the head were obtained using MRA technique without contrast. CONTRAST:  7mL GADAVIST GADOBUTROL 1 MMOL/ML IV SOLN COMPARISON:  CT head 06/19/2020 FINDINGS: MRI HEAD FINDINGS Brain: Bilateral deep brain stimulators extend through the thalamus and into the midbrain bilaterally. There is moderate edema surrounding both leads similar to that seen on the recent CT. No associated restricted diffusion or hemorrhage in the edema. Following contrast infusion, no abnormal enhancement is seen surrounding the leads to suggest infection. Ventricle size normal. Generalized atrophy. Negative for acute infarct. No mass lesion. There are small posterior subdural hematomas bilaterally in the occipital region. These contain methemoglobin suggesting subacute duration and measure approximately 3 mm each. There is also a small amount of blood extending along the falx posteriorly. Vascular: Normal arterial flow voids Skull and upper cervical spine: No focal skeletal lesion. Sinuses/Orbits: Mild mucosal edema paranasal sinuses. Negative orbit Other: None MRA HEAD FINDINGS Right vertebral artery dominant. Both vertebral arteries  contribute to the basilar but mostly on the right. PICA patent bilaterally. Basilar patent. AICA, superior cerebellar, posterior cerebral arteries patent bilaterally fetal origin left posterior cerebral artery. Decreased signal in the right posterior cerebral artery is most likely related to artifact. Internal carotid artery widely patent bilaterally. Anterior and middle cerebral arteries patent bilaterally. Decreased signal in the middle cerebral arteries bilaterally due to artifact. Negative for cerebral aneurysm. IMPRESSION: 1. Edema surrounding the deep brain stimulator leads bilaterally. No restricted diffusion, hemorrhage, or  enhancement to suggest infection around the leads. 2. Small occipital subdural hematoma bilaterally which appears subacute with methemoglobin. 3. Negative for acute infarct. 4. Negative MRA Electronically Signed   By: Marlan Palau M.D.   On: 06/21/2020 15:04    Labs:  Basic Metabolic Panel: Recent Labs  Lab 07/04/20 0941  NA 138  K 4.2  CL 105  CO2 23  GLUCOSE 152*  BUN 19  CREATININE 0.73  CALCIUM 9.0    CBC: Recent Labs  Lab 07/04/20 0941  WBC 7.5  NEUTROABS 4.6  HGB 11.9*  HCT 36.0*  MCV 91.4  PLT 236    CBG: No results for input(s): GLUCAP in the last 168 hours.   Family history.  Parents with hypertension and hyperlipidemia.  Denies any colon cancer or esophageal cancer Brief HPI:   Reginald Jenkins is a 74 y.o. right-handed male with history of Parkinson's disease maintained on Sinemet as well as Artane followed by neurology services Dr.Tingler in [place] status post brain stimulator [facility] on [facility]06/[facility], hypertension hypothyroidism as well as chronic back pain maintained on oxycodone 15 mg every 4 hours followed by Dr. Erskin Burnet in [place].  Patient lives with spouse reportedly independent with steady decline functionally and cognitive deficits.  1 level home with ramped entrance.  Cranial CT scan  showed bilateral stimulator device seen extending from the previously described bifrontal craniotomy with tips in the expected position of the substantia nigra bilaterally.  Mild low density seen involving the subcortical white matter around the stimulator leads in both frontal lobes most consistent with postoperative edema.  No hemorrhage or definite acute infarct noted.  Admission chemistry sodium 133 glucose 122 hemoglobin 12.4 TSH 0.164 and free T4 1.85, urinalysis negative nitrite sedimentation rate 39 ammonia level 18 vitamin B12 69.  Neurology service as well as neurosurgery consulted follow-up cranial CT scan 06/19/2020 stable bifrontal neurostimulator leads white matter low density surrounding leads in both cerebral hemispheres slightly less prominent than prior tracing.  Stable ill-defined low-density in the midbrain and pons bilaterally possibly representing ischemic changes.  Stable ill-defined low-density right occipital lobe possibly representing area of subacute infarct and recommendations remained to continue to monitor and did remain on low-dose aspirin.  Follow-up MRI 06/21/2020 showing edema around DBS leads bilaterally no suspected infection.  Small bilateral occipital subdural hematomas.  [name] delirium agitation did continue to improve he was initially on low-dose Seroquel.  He was on low-dose Ambien as prior to admission reduced to 5 mg no current plan for EEG.  Placed on B12 1000 mcg intramuscularly daily x7 days.  Therapy evaluations completed and patient was admitted for a comprehensive rehab program   Hospital Course: Reginald Jenkins was admitted to rehab 06/21/2020 for inpatient therapies to consist of PT, ST and OT at least three hours five days a week. Past admission physiatrist, therapy team and rehab RN have worked together to provide customized collaborative inpatient rehab.  Pertaining to patient's Parkinson's disease DBS placement [facility].  Patient  would follow-up outpatient and remained on Sinemet as well as Artane.  Staples had been removed.  He remained on low-dose aspirin.  Chronic pain managed with use of oxycodone adjusted to 15 mg twice daily as needed as advised as well as Flexeril as needed would follow-up with Dr. Madelin Rear [place].  Mood stabilization with low-dose Seroquel and trazodone his Ambien had been discontinued.  His Seroquel appeared to be somewhat sedating was transitioned  to Risperdal.  Blood pressure control with lisinopril.  Hypothyroidism with Synthroid ongoing.  Patient did have a mild B12 deficiency completing 7-day course of vitamin B12.   Blood pressures were monitored on TID basis and controlled      Rehab course: During patient's stay in rehab weekly team conferences were held to monitor patient's progress, set goals and discuss barriers to discharge. At admission, patient required minimal assist 200 feet 1 person hand-held assist minimal assist stand pivot transfers supervision sit to supine and minimal assist supine to sit.  Total assist upper body bathing total assist lower body bathing total assist upper body dressing total assist lower body dressing  Physical exam.  Blood pressure 135/65 pulse 71 temperature 97.9 respirations 18 oxygen saturations 100% Constitutional.  General.  No acute distress HEENT Head.  Normocephalic and atraumatic Eyes.  Pupils round and reactive to light no discharge.nystagmus Neck.  Supple nontender no JVD without thyromegaly Cardiac regular rate rhythm without any extra sounds or murmur heard Abdomen.  Soft nontender positive bowel sounds without rebound Respiratory effort normal no respiratory distress without wheeze Neurological.  Alert and oriented x1 makes good eye contact with examiner follows simple commands.  Mild dysarthria.  Some delay in auditory processing.  Strength grossly graded 5/5.  He does have a resting tremor with rigidity to all 4 limbs.  He/  has had  improvement in activity tolerance, balance, postural control as well as ability to compensate for deficits. He/ has had improvement in functional use RUE/LUE  and RLE/LLE as well as improvement in awareness.  Patient with excellent overall progress.  Perform basic transfers throughout sessions with close supervision somewhat impulsive.  Focused on several high-level balance techniques.  Requires contact-guard assist to minimal assist overall for balance.  Ambulates 250 feet rolling walker to the gym.  Patient ambulates to the sink contact-guard assist to wash hands and oral hygiene.  Min mod assist verbal cues throughout sessions.  Full family teaching completed plan discharge to home       Disposition: Discharge to home    Diet: Regular  Special Instructions: No driving smoking or alcohol  Medications at discharge 1.  Tylenol as needed 2.  Sinemet 25-100 mg 4 times daily 3.  Flexeril 10 mg p.o. 3 times daily as needed muscle spasms 4.  Colace 100 mg p.o. twice daily 5.  Ferrous sulfate 3 and 25 mg p.o. twice daily 6.  Synthroid 125 mcg p.o. daily 7.  Lisinopril 5 mg p.o. daily 8.  Multivitamin daily 9.Patanolol ophthalmic solution 1 drop both eyes twice daily as needed 10.  Oxycodone 15 mg p.o. twice daily as needed severe pain 11.  Risperdal 0.5 mg nightly and as needed 12.  Trazodone 100 mg p.o. daily 13.  Artane 2 mg p.o. 3 times daily with meals  30-35 minutes were spent completing discharge summary and discharge planning     Follow-up Information    Lovorn, Aundra Millet, MD Follow up.   Specialty: Physical Medicine and Rehabilitation Why: No follow up needed Contact information: [address] [place] [address] [phone]        Alcide Goodness, MD Follow up.   Specialty: Pain Medicine Why: Call for appointment Contact information: [facility] [address] [place] [address] [phone]               Signed: Charlton Amor 07/05/2020, 5:21 AM

## 2020-07-03 NOTE — Progress Notes (Signed)
[facility] PHYSICAL MEDICINE & REHABILITATION PROGRESS NOTE   Subjective/Complaints:  Slept from midnight to 6am this Am when they woke him up- wife was OK, no agitation.    ROS:  Pt denies SOB, abd pain, CP, N/V/C/D, and vision changes   Objective:   No results found. No results for input(s): WBC, HGB, HCT, PLT in the last 72 hours. No results for input(s): NA, K, CL, CO2, GLUCOSE, BUN, CREATININE, CALCIUM in the last 72 hours.  Intake/Output Summary (Last 24 hours) at 07/03/2020 1903 Last data filed at 07/03/2020 1814 Gross per 24 hour  Intake 358 ml  Output --  Net 358 ml     Physical Exam: Vital Signs Blood pressure 114/61, pulse 76, temperature 98.6 F (37 C), temperature source Oral, resp. rate 20, height 5\' 7"  (1.702 m), weight 77.1 kg, SpO2 100 %.  Constitutional: No distress . Vital signs reviewed. Heading to therapy, wife in room, NAD HEENT: EOMI, oral membranes moist- head still sensitive Neck: supple Cardiovascular: no JVD  Respiratory/Chest: normal effort; no accessory muscle uset    GI/Abdomen: BS +, non-tender, non-distended Ext: no clubbing, cyanosis, or edema Psych: pleasant and cooperative Neurological: Ox3 still Mild dysarthria but improving. Masked facies, sl rigid. He does have a resting tremor esp in RUE- fast tremor- constantly- no changeMotor: 5/5 throughout      Assessment/Plan: 1. 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  Physiatrist is providing close team supervision and 24 hour management of active medical problems listed below.  Physiatrist and rehab team continue to assess barriers to discharge/monitor patient progress toward functional and medical goals  Care Tool:  Bathing    Body parts bathed by patient: Chest, Abdomen, Front perineal area, Face, Right arm,  Left arm, Buttocks, Right upper leg, Left upper leg, Right lower leg, Left lower leg   Body parts bathed by helper: Right arm, Left arm, Chest, Abdomen, Front perineal area, Buttocks, Right upper leg, Right lower leg, Left upper leg, Left lower leg, Face     Bathing assist Assist Level: Contact Guard/Touching assist     Upper Body Dressing/Undressing Upper body dressing   What is the patient wearing?: Pull over shirt    Upper body assist Assist Level: Minimal Assistance - Patient > 75%    Lower Body Dressing/Undressing Lower body dressing      What is the patient wearing?: Underwear/pull up, Pants     Lower body assist Assist for lower body dressing: Contact Guard/Touching assist     Toileting Toileting    Toileting assist Assist for toileting: Moderate Assistance - Patient 50 - 74%     Transfers Chair/bed transfer  Transfers assist     Chair/bed transfer assist level: Contact Guard/Touching assist Chair/bed transfer assistive device:   Ambulation assist      Assist level: Contact Guard/Touching assist Assistive device: No Device Max distance: +300'   Walk 10 feet activity   Assist     Assist level: Contact Guard/Touching assist Assistive device: No Device   Walk 50 feet activity   Assist    Assist level: Contact Guard/Touching assist Assistive device: No Device    Walk 150 feet activity   Assist    Assist level: Contact Guard/Touching assist Assistive device: No Device    Walk 10 feet on uneven surface  activity   Assist  Assist level: Contact Guard/Touching assist     Wheelchair     Assist Will patient use wheelchair at discharge?: No             Wheelchair 50 feet with 2 turns activity    Assist            Wheelchair 150 feet activity     Assist          Blood pressure 114/61, pulse 76, temperature 98.6 F (37 C), temperature source Oral, resp. rate 20,  height 5\' 7"  (1.702 m), weight 77.1 kg, SpO2 100 %.  Medical Problem List and Plan: 1.Decreased functional mobilitysecondary to Parkinson's disease [facility] -patientmay notshower  8/13- will d/c staples- and then pt can shower  8/16- pt can shower- will remind OT  8/20- head looking great- almost healed -ELOS/Goals: 07/05/20, Mod I/supervision Continue CIR 2. Antithrombotics:SCDs -DVT/anticoagulation:  -antiplatelet therapy: Aspirin 81 mg daily 3. Pain Management/chronicbackpain:Currently on home regimen of oxycodone 15 mg 4 times daily, Flexeril 10 mg 3 times daily as needed. Monitor mental status 8/20- meds being held due to no complaints of pain/orientation 4. Mood:Ambien as prior to admission decreased to 5 mg nightly -antipsychotic agents: Seroquel 12.5 mg nightly as needed  8/20- will increase Seroquel to 50 mg QHS and keep another seroquel 50 mg QHS prn- also maintain trazodone 100 mg QHS and keep off Ambien  8/22- made sure seroquel and trazodone given at 8pm. Pt responded well last night. Continue  8/23- pt slept from midnight to 6am- no agitation- con't meds 5. Neuropsych: This patientisNOTcapable of making decisions on hisown behalf. 6. Skin/Wound Care:Routine skin checks  Staples removed 8/13, healing well 7. Fluids/Electrolytes/Nutrition:Routine in and outs. CMP ordered for tomorrow a.m.  8/20- labs looks OK  8/23- will check labs in AM 8.Parkinson's disease. Continue Sinemet 25-100 mg 4 times daily as well as Artane 2 mg 3 times daily 9. Hypertension. Lisinopril 5 mg daily.  Monitor with increased mobility. Well controlled  8/21- BP controlled- con't regimen 10. B12 deficiency. Complete 7-daycourse of vitamin B12 1000 mcg daily x7 days 11. Hypothyroidism. Continue  Synthroid       LOS: 12 days A FACE TO FACE EVALUATION WAS PERFORMED  Reginald Jenkins 07/03/2020, 7:03 PM

## 2020-07-04 ENCOUNTER — Inpatient Hospital Stay (HOSPITAL_COMMUNITY): Payer: Medicare Other

## 2020-07-04 ENCOUNTER — Inpatient Hospital Stay (HOSPITAL_COMMUNITY): Payer: Medicare Other | Admitting: Occupational Therapy

## 2020-07-04 LAB — CBC WITH DIFFERENTIAL/PLATELET
Abs Immature Granulocytes: 0.04 10*3/uL (ref 0.00–0.07)
Basophils Absolute: 0.1 10*3/uL (ref 0.0–0.1)
Basophils Relative: 1 %
Eosinophils Absolute: 0.2 10*3/uL (ref 0.0–0.5)
Eosinophils Relative: 3 %
HCT: 36 % — ABNORMAL LOW (ref 39.0–52.0)
Hemoglobin: 11.9 g/dL — ABNORMAL LOW (ref 13.0–17.0)
Immature Granulocytes: 1 %
Lymphocytes Relative: 24 %
Lymphs Abs: 1.8 10*3/uL (ref 0.7–4.0)
MCH: 30.2 pg (ref 26.0–34.0)
MCHC: 33.1 g/dL (ref 30.0–36.0)
MCV: 91.4 fL (ref 80.0–100.0)
Monocytes Absolute: 0.8 10*3/uL (ref 0.1–1.0)
Monocytes Relative: 11 %
Neutro Abs: 4.6 10*3/uL (ref 1.7–7.7)
Neutrophils Relative %: 60 %
Platelets: 236 10*3/uL (ref 150–400)
RBC: 3.94 MIL/uL — ABNORMAL LOW (ref 4.22–5.81)
RDW: 13.2 % (ref 11.5–15.5)
WBC: 7.5 10*3/uL (ref 4.0–10.5)
nRBC: 0 % (ref 0.0–0.2)

## 2020-07-04 LAB — BASIC METABOLIC PANEL
Anion gap: 10 (ref 5–15)
BUN: 19 mg/dL (ref 8–23)
CO2: 23 mmol/L (ref 22–32)
Calcium: 9 mg/dL (ref 8.9–10.3)
Chloride: 105 mmol/L (ref 98–111)
Creatinine, Ser: 0.73 mg/dL (ref 0.61–1.24)
GFR calc Af Amer: >60 mL/min (ref >60)
GFR calc non Af Amer: >60 mL/min (ref >60)
Glucose, Bld: 152 mg/dL — ABNORMAL HIGH (ref 70–99)
Potassium: 4.2 mmol/L (ref 3.5–5.1)
Sodium: 138 mmol/L (ref 135–145)

## 2020-07-04 MED ORDER — RISPERIDONE 0.5 MG PO TABS: 0.5000 mg | ORAL_TABLET | Freq: Every evening | ORAL | 0 refills | Status: DC | PRN

## 2020-07-04 MED ORDER — CYCLOBENZAPRINE HCL 10 MG PO TABS: 10.0000 mg | ORAL_TABLET | Freq: Three times a day (TID) | ORAL | 0 refills | Status: DC | PRN

## 2020-07-04 MED ORDER — TRAZODONE HCL 100 MG PO TABS: 100.0000 mg | ORAL_TABLET | Freq: Every day | ORAL | 0 refills | Status: AC

## 2020-07-04 MED ORDER — RISPERIDONE 0.5 MG PO TABS
0.5000 mg | ORAL_TABLET | Freq: Every evening | ORAL | Status: DC | PRN
  Filled 2020-07-04: qty 1

## 2020-07-04 MED ORDER — LEVOTHYROXINE SODIUM 125 MCG PO TABS: 125.0000 ug | ORAL_TABLET | Freq: Every day | ORAL | 0 refills | Status: DC

## 2020-07-04 MED ORDER — LISINOPRIL 5 MG PO TABS: 5.0000 mg | ORAL_TABLET | Freq: Every evening | ORAL | 0 refills | Status: DC

## 2020-07-04 MED ORDER — FERROUS SULFATE 325 (65 FE) MG PO TABS: 325.0000 mg | ORAL_TABLET | Freq: Two times a day (BID) | ORAL | 3 refills | Status: DC

## 2020-07-04 MED ORDER — QUETIAPINE FUMARATE 50 MG PO TABS: 50.0000 mg | ORAL_TABLET | Freq: Every evening | ORAL | 0 refills | Status: DC | PRN

## 2020-07-04 MED ORDER — OXYCODONE HCL 15 MG PO TABS
15.0000 mg | ORAL_TABLET | Freq: Two times a day (BID) | ORAL | 0 refills | Status: DC | PRN
Start: 2020-07-04 — End: 2023-02-25

## 2020-07-04 MED ORDER — RISPERIDONE 0.5 MG PO TABS
0.5000 mg | ORAL_TABLET | Freq: Every day | ORAL | Status: DC
  Administered 2020-07-04: 0.5 mg via ORAL
  Filled 2020-07-04: qty 1

## 2020-07-04 NOTE — Progress Notes (Signed)
[place] PHYSICAL MEDICINE & REHABILITATION PROGRESS NOTE   Subjective/Complaints:  Pt and wife reports was up and down all night- less sleep- didn't work quite as well- wife thinks Seroquel is making him MORE agitated, at least last night- will try to d/c seroquel and add Risperidone 0.5 mg at 8pm along with trazodone- and have prn Risperidone.   Really wants to go home tomorrow.    ROS:   Pt denies SOB, abd pain, CP, N/V/C/D, and vision changes   Objective:   No results found. Recent Labs    07/04/20 0941  WBC 7.5  HGB 11.9*  HCT 36.0*  PLT 236   Recent Labs    07/04/20 0941  NA 138  K 4.2  CL 105  CO2 23  GLUCOSE 152*  BUN 19  CREATININE 0.73  CALCIUM 9.0    Intake/Output Summary (Last 24 hours) at 07/04/2020 1024 Last data filed at 07/03/2020 2022 Gross per 24 hour  Intake 238 ml  Output --  Net 238 ml     Physical Exam: Vital Signs Blood pressure 113/63, pulse 72, temperature 99.7 F (37.6 C), temperature source Oral, resp. rate 16, height 5\' 7"  (1.702 m), weight 77.1 kg, SpO2 100 %.  Constitutional: No distress . Walking with no AD in hallway with PT- wife in room, appropriate, Ox3, NAD HEENT: EOMI, oral membranes moist- head still sensitive Neck: supple Cardiovascular: RRR Respiratory/Chest: CTA B/L- no W/R/R- good air movement    GI/Abdomen: Soft, NT, ND, (+)BS  Ext: no clubbing, cyanosis, or edema Psych: pleasant and cooperative- more appropriate, better memory Neurological: Ox3 still Mild dysarthria but improving. Masked facies, sl rigid- no change. He does have a resting tremor esp in RUE- fast tremor- constantly- no changeMotor: 5/5 throughout      Assessment/Plan: 1. 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  Physiatrist is providing close team supervision and 24  hour management of active medical problems listed below.  Physiatrist and rehab team continue to assess barriers to discharge/monitor patient progress toward functional and medical goals  Care Tool:  Bathing    Body parts bathed by patient: Chest, Abdomen, Front perineal area, Face, Right arm, Left arm, Buttocks, Right upper leg, Left upper leg, Right lower leg, Left lower leg   Body parts bathed by helper: Right arm, Left arm, Chest, Abdomen, Front perineal area, Buttocks, Right upper leg, Right lower leg, Left upper leg, Left lower leg, Face     Bathing assist Assist Level: Contact Guard/Touching assist     Upper Body Dressing/Undressing Upper body dressing   What is the patient wearing?: Pull over shirt    Upper body assist Assist Level: Minimal Assistance - Patient > 75%    Lower Body Dressing/Undressing Lower body dressing      What is the patient wearing?: Underwear/pull up, Pants     Lower body assist Assist for lower body dressing: Contact Guard/Touching assist     Toileting Toileting    Toileting assist Assist for toileting: Moderate Assistance - Patient 50 - 74%     Transfers Chair/bed transfer  Transfers assist     Chair/bed transfer assist level: Contact Guard/Touching assist Chair/bed transfer assistive device:   Ambulation assist      Assist level: Contact Guard/Touching assist Assistive device: No Device Max distance: +300'   Walk 10 feet activity  Assist     Assist level: Contact Guard/Touching assist Assistive device: No Device   Walk 50 feet activity   Assist    Assist level: Contact Guard/Touching assist Assistive device: No Device    Walk 150 feet activity   Assist    Assist level: Contact Guard/Touching assist Assistive device: No Device    Walk 10 feet on uneven surface  activity   Assist     Assist level: Contact Guard/Touching assist     Wheelchair     Assist  Will patient use wheelchair at discharge?: No             Wheelchair 50 feet with 2 turns activity    Assist            Wheelchair 150 feet activity     Assist          Blood pressure 113/63, pulse 72, temperature 99.7 F (37.6 C), temperature source Oral, resp. rate 16, height 5\' 7"  (1.702 m), weight 77.1 kg, SpO2 100 %.  Medical Problem List and Plan: 1.Decreased functional mobilitysecondary to Parkinson's disease [facility] -patientmay notshower  8/13- will d/c staples- and then pt can shower  8/16- pt can shower- will remind OT  8/20- head looking great- almost healed  8/24- d/c tomorrow -ELOS/Goals: 07/05/20, Mod I/supervision Continue CIR 2. Antithrombotics:SCDs -DVT/anticoagulation:  -antiplatelet therapy: Aspirin 81 mg daily 3. Pain Management/chronicbackpain:Currently on home regimen of oxycodone 15 mg 4 times daily, Flexeril 10 mg 3 times daily as needed. Monitor mental status 8/20- meds being held due to no complaints of pain/orientation 4. Mood:Ambien as prior to admission decreased to 5 mg nightly -antipsychotic agents: Seroquel 12.5 mg nightly as needed  8/20- will increase Seroquel to 50 mg QHS and keep another seroquel 50 mg QHS prn- also maintain trazodone 100 mg QHS and keep off Ambien  8/22- made sure seroquel and trazodone given at 8pm. Pt responded well last night. Continue  8/23- pt slept from midnight to 6am- no agitation- con't meds  8/24- per wife request, will d/c Seroquel and change to Risperidone 0.5 mg at 8pm and prn if needed overnight. Con't trazodone 100 mg QHS 5. Neuropsych: This patientisNOTcapable of making decisions on hisown behalf. 6. Skin/Wound Care:Routine skin checks  Staples removed 8/13, healing well 7. Fluids/Electrolytes/Nutrition:Routine in and  outs. CMP ordered for tomorrow a.m.  8/20- labs looks OK  8/23- will check labs in AM 8.Parkinson's disease. Continue Sinemet 25-100 mg 4 times daily as well as Artane 2 mg 3 times daily 9. Hypertension. Lisinopril 5 mg daily.  Monitor with increased mobility. Well controlled  8/21- BP controlled- con't regimen 10. B12 deficiency. Complete 7-daycourse of vitamin B12 1000 mcg daily x7 days 11. Hypothyroidism. Continue Synthroid 12. D/C 8/25- explained to wife should NOT conitnue Ambien at home-  Can make confusion worse and make pts agitated- needs to f/u with [facility] as soon as can. Will have meds changed- also let nursing know to try and give prn if needed tonight.      LOS: 13 days A FACE TO FACE EVALUATION WAS PERFORMED  Philicia Heyne 07/04/2020, 10:24 AM

## 2020-07-04 NOTE — Progress Notes (Signed)
Speech Language Pathology Discharge Summary  Patient Details  Name: Reginald Jenkins MRN: [id number] Date of Birth: 10-Sep-1946  Today's Date: 07/04/2020 SLP Individual Time: 0904-1000 SLP Individual Time Calculation (min): 56 min   Skilled Therapeutic Interventions: Skilled ST services focused on education and cognitive skills. Pt demonstrated ability to recall am PT events with mod A verbal cues from wife and safety protocol during ambulation with min A verbal cues from SLP. SLP facilitated assessment of current cognitive linguistic skills with formal assessment SLUMS, pt scored 13 out 30 (n=>27) indicating mild-moderate impairments in sustained attention, short term recall, problem solving and emergent awareness. SLP provided education pertaining to cognitive progress and continued deficits to be targeted by follow up ST services with pt and pt's wife. SLP also facilitated recall, basic problem solving, sustained attention and error awareness in familiar card task (Blink), pt demonstrated ability to recall 2 out 3 rules mod I, required max A fade to min A verbal cues for problem solving and error awareness once the pt familiarized himself with the task. All questions answered to satisfaction. Pt was left in room with wife. SLP recommends to continue skilled services.     Patient has met 7 of 7 long term goals.  Patient to discharge at overall Min;Mod level.  Reasons goals not met:     Clinical Impression/Discharge Summary:   Pt made great progress meeting 7 out 7 goals discharging at min-mod A with an upgrade in level for sustained attention, basic problem solving and orientation. Pt demonstrated increased mentation from initial confused state at admission to CIR. SLP facilitated basic problem solving, intellectual then emergent awareness, safety awareness, short term recall, sustained attention and ability to express wants/needs during functional tasks. Pt scored 13 out 30 (n=>27) on SLUMS at  discharge with continued deficits in short term recall, sustained attention, basic problem solving and error awareness to be addressed in follow up ST services. Education was on going and completed with pt's wife, handout given for memory strategies and al questions answered to satisfaction. Pt benefited from skilled ST services in order to maximize functional independence and reduce burden of care, requiring 24 hour supervision at discharge with continued skilled ST services.  Care Partner:  Caregiver Able to Provide Assistance: Yes  Type of Caregiver Assistance: Cognitive;Physical  Recommendation:  24 hour supervision/assistance;Home Health SLP;Outpatient SLP  Rationale for SLP Follow Up: Maximize cognitive function and independence;Reduce caregiver burden   Equipment: N/A   Reasons for discharge: Discharged from hospital   Patient/Family Agrees with Progress Made and Goals Achieved: Yes    [name]  [facility] 07/04/2020, 12:44 PM

## 2020-07-04 NOTE — Progress Notes (Signed)
Patient ID: Reginald Jenkins, male   DOB: June 12, 1946, 74 y.o.   MRN: [id number]   SW met with pt and pt husband to review discharge. SW informed on [facility] services resuming with [facility].   Loralee Pacas, MSW, [place] Office: [phone] Cell: [phone] Fax: [phone]

## 2020-07-04 NOTE — Patient Care Conference (Signed)
Inpatient RehabilitationTeam Conference and Plan of Care Update Date: 07/04/2020   Time: 11:49 AM   Patient Name: Reginald Jenkins      Medical Record Number: [id number]  Date of Birth: 1946/02/27 Sex: Male         Room/Bed: 4W15C/4W15C-02 Payor Info: Payor: MEDICARE / Plan: MEDICARE PART A AND B / Product Type: *No Product type* /    Admit Date/Time:  06/21/2020  4:29 PM  Primary Diagnosis:  Debility  Hospital Problems: Principal Problem:   Debility Active Problems:   Parkinson disease [facility])    Expected Discharge Date: Expected Discharge Date: 07/05/20  Team Members Present: Physician leading conference: Dr. Courtney Heys Care Coodinator Present: Dorthula Nettles, RN, BSN, CRRN;Loralee Pacas, [place] Nurse Present: Mohammed Kindle, RN PT Present: Tereasa Coop, PT OT Present: Lillia Corporal, OT SLP Present: Charolett Bumpers, SLP PPS Coordinator present : Ileana Ladd, PT     Current Status/Progress Goal Weekly Team Focus  Bowel/Bladder   Pt is continent of b and b lbm 07/03/20  remain cont of b and b  Assess q shhift and prn   Swallow/Nutrition/ Hydration             ADL's   Close supervision/occasional CGA for standing ADLs, showers with close supervision with shower seat, toileting CGA/close supervision  Supervision for most, Min for LB ADLs  sequencing/problem solving, safety, standing balance   Mobility   CGA transfers and ambulation >300', independent bed mobility.  Supervision  DC prep, dynamic balance with reciprocal coordination tasks   Communication   Min A  Min      Safety/Cognition/ Behavioral Observations  Min-Mod A  Min-Mod - met      Pain   Pt has no current complaints of pain  remain pain free  Assess q shifty and prn   Skin   No current skin issues  Assess q shift prn        Discharge Planning:  D/c to home with 24/7 care from his wife, and PRN assistance from children.   Team Discussion: Continent B/B, not sleeping well, MD adjusting medication.  Nursing only giving Tylenol for pain now. Close supervision for ADL's. Supervision with slight balance deficits but this is due to Parkinson's Disease. At goal level with SLP. Has DME.  Patient on target to meet rehab goals: yes  *See Care Plan and progress notes for long and short-term goals.   Revisions to Treatment Plan:  None  Teaching Needs: Family Education complete  Current Barriers to Discharge: None, ready for discharge.  Possible Resolutions to Barriers: N/A     Medical Summary Current Status: no wounds- doesn't sleep well at night-no pain; continent- giving tylenol not oxycodone  Barriers to Discharge: Decreased family/caregiver support;Home enviroment access/layout;Behavior  Barriers to Discharge Comments: agitation at night- changed to risperidone; MMSE 13/30 done today- d/c tomorrow Possible Resolutions to [employer] Focus: changed to risperidone-will try tonight;  doing great functionally- has DME he needs; close Supervision for PT/OT; mild balance deficits- due to PD   Continued Need for Acute Rehabilitation Level of Care: The patient requires daily medical management by a physician with specialized training in physical medicine and rehabilitation for the following reasons: Direction of a multidisciplinary physical rehabilitation program to maximize functional independence : Yes Medical management of patient stability for increased activity during participation in an intensive rehabilitation regime.: Yes Analysis of laboratory values and/or radiology reports with any subsequent need for medication adjustment and/or medical intervention. : Yes   I attest  that I was present, lead the team conference, and concur with the assessment and plan of the team.   Cristi Loron 07/04/2020, 5:31 PM

## 2020-07-04 NOTE — Progress Notes (Signed)
Occupational Therapy Discharge Summary  Patient Details  Name: Reginald Jenkins MRN: [id number] Date of Birth: 24-Jan-1946    Patient has met 12 of 13 long term goals due to improved activity tolerance, improved balance, postural control, improved attention, improved awareness and improved coordination.  Patient to discharge at overall Supervision level.  [name] care partner is independent to provide the necessary physical and cognitive assistance at discharge. Pt is supervision with all ADLs, wife has been trained to provide appropriate supervision during self care.   Reasons goals not met: Pt continues to require Mod verbal cues for safety, sequencing, and attending to ADL tasks for safety, especially during ADL transfers and standing to don pants over hips, washing buttocks, etc.   Recommendation:  Patient will benefit from ongoing skilled OT services in home health setting to continue to advance functional skills in the area of BADL and Reduce care partner burden.  Equipment: pt already has shower seat  Reasons for discharge: treatment goals met and discharge from hospital  Patient/family agrees with progress made and goals achieved: Yes  OT Discharge Precautions/Restrictions  Precautions Precautions: Fall Restrictions Weight Bearing Restrictions: No Pain Pain Assessment Pain Scale: 0-10 Pain Score: 0-No pain ADL ADL Eating: Supervision/safety Grooming: Supervision/safety Upper Body Bathing: Supervision/safety Where Assessed-Upper Body Bathing: Shower Lower Body Bathing: Supervision/safety Where Assessed-Lower Body Bathing: Shower Upper Body Dressing: Setup Where Assessed-Upper Body Dressing: Edge of bed Lower Body Dressing: Supervision/safety Where Assessed-Lower Body Dressing: Edge of bed Toileting: Supervision/safety Toilet Transfer: Close supervision Toilet Transfer Method: Ambulating Tub/Shower Transfer: Close supervison Clinical cytogeneticist Method:  Optometrist: Shower seat without back Social research officer, government: Close supervision Social research officer, government Method: Heritage manager: Grab bars Vision Baseline Vision/History: Wears glasses Wears Glasses: At all times Perception  Perception: Within Functional Limits Praxis Praxis: Impaired Praxis Impairment Details: Initiation;Motor planning Cognition Overall Cognitive Status: Impaired/Different from baseline Arousal/Alertness: Awake/alert Orientation Level: Disoriented to time;Oriented to situation;Oriented to person;Oriented to place Attention: Sustained Sustained Attention: Impaired Sustained Attention Impairment: Verbal complex;Functional complex Memory: Impaired Memory Impairment: Retrieval deficit;Storage deficit;Decreased recall of new information Awareness: Impaired Awareness Impairment: Emergent impairment Problem Solving: Impaired Problem Solving Impairment: Verbal basic;Functional basic Safety/Judgment: Impaired Sensation Sensation Light Touch: Appears Intact Coordination Gross Motor Movements are Fluid and Coordinated: No Fine Motor Movements are Fluid and Coordinated: No Motor  Motor Motor: Abnormal postural alignment and control Motor - Skilled Clinical Observations: parkinsons tremors RUE>LUE Mobility  Bed Mobility Bed Mobility: Supine to Sit;Sit to Supine Supine to Sit: Independent Sit to Supine: Independent Transfers Sit to Stand: Supervision/Verbal cueing Stand to Sit: Supervision/Verbal cueing  Trunk/Postural Assessment  Cervical Assessment Cervical Assessment: Exceptions to [facility] Thoracic Assessment Thoracic Assessment: Exceptions to [facility] Lumbar Assessment Lumbar Assessment: Exceptions to [facility] Postural Control Righting Reactions: delayed Protective Responses: delayed  Balance Balance Balance Assessed: Yes Static Sitting Balance Static Sitting - Level of Assistance: 7: Independent Dynamic Sitting  Balance Dynamic Sitting - Level of Assistance: 5: Stand by assistance Static Standing Balance Static Standing - Level of Assistance: 5: Stand by assistance Dynamic Standing Balance Dynamic Standing - Level of Assistance: 5: Stand by assistance Extremity/Trunk Assessment RUE Assessment RUE Assessment: Exceptions to [facility] General Strength Comments: RUE tremors>LUE; 4 to 4+/5 strength LUE Assessment LUE Assessment: Exceptions to [facility] General Strength Comments: decreased coordination LUE>RUE but functional   Viona Gilmore 07/04/2020, 2:51 PM

## 2020-07-04 NOTE — Progress Notes (Signed)
Occupational Therapy Session Note  Patient Details  Name: Reginald Jenkins MRN: [id number] Date of Birth: 03/12/1946  Today's Date: 07/04/2020 OT Individual Time: 1330-1416 OT Individual Time Calculation (min): 46 min    Short Term Goals: Week 2:  OT Short Term Goal 1 (Week 2): Pt will perform 3/3 toileting tasks with CS and Min VCs OT Short Term Goal 2 (Week 2): Pt will bathe body parts with MOD VC to demo improved sequencing OT Short Term Goal 3 (Week 2): Pt will perform LB dressing with occasional VCs and CS for balance OT Short Term Goal 4 (Week 2): Pt will consistently perform ADL transfers with CS and safe use of AD   Skilled Therapeutic Interventions/Progress Updates:    Pt greeted at time of session sitting up in chair with wife present, agreeable to OT session and aware today is last day of OT and plan to go home tomorrow. Agreeable to ADl session for last day, ambulated to shower with CS and no AD, doffed clothing in standing initially but agreeable to sit to doff LB clothing pants, underwear, etc. Ambulated short distance to shower without AD and CS, transferred to shower seat in same manner. Performed UB/LB bathing at shower level with close supervision, requiring Min-Mod verbal cues throughout for problem solving, sequencing, and safety to remember to sit for LB ADLs instead of standing for safety. Wife present throughout entire ADL, providing appropriate verbal cues as needed for safety, sequencing, and problem solving. Pt dried off in the same manner and ambulated to bed without AD with close supervision and transferred in the same manner. UB dress set up, LB dress supervision with cues for sequencing and sitting for safety when applicable. Ambulated to gym with CS, no AD and performed seated UB strengthening with 3# dowel for bicep curl, chest press, FWD and backward circles with multimodal cuing for form. Ambulated back to room, SPT to recliner, call bell in reach and all needs met.    Therapy Documentation Precautions:  Precautions Precautions: Fall Precaution Comments: wife denies fall history  Restrictions Weight Bearing Restrictions: No     Therapy/Group: Individual Therapy  Viona Gilmore 07/04/2020, 2:51 PM

## 2020-07-05 NOTE — Plan of Care (Signed)
  Problem: Consults Goal: RH GENERAL PATIENT EDUCATION Description: See Patient Education module for education specifics. Outcome: Completed/Met Goal: Skin Care Protocol Initiated - if Braden Score 18 or less Description: If consults are not indicated, leave blank or document N/A Outcome: Completed/Met Goal: Nutrition Consult-if indicated Outcome: Completed/Met   Problem: RH BOWEL ELIMINATION Goal: RH STG MANAGE BOWEL WITH ASSISTANCE Description: STG Manage Bowel with min Assistance. Outcome: Completed/Met Goal: RH STG MANAGE BOWEL W/MEDICATION W/ASSISTANCE Description: STG Manage Bowel with Medication with min Assistance. Outcome: Completed/Met   Problem: RH BLADDER ELIMINATION Goal: RH STG MANAGE BLADDER WITH ASSISTANCE Description: STG Manage Bladder With min Assistance Outcome: Completed/Met   Problem: RH SKIN INTEGRITY Goal: RH STG SKIN FREE OF INFECTION/BREAKDOWN Description: Skin to remain free of breakdown while on rehab with min assist. Outcome: Completed/Met Goal: RH STG MAINTAIN SKIN INTEGRITY WITH ASSISTANCE Description: STG Maintain Skin Integrity With min Assistance. Outcome: Completed/Met Goal: RH STG ABLE TO PERFORM INCISION/WOUND CARE W/ASSISTANCE Description: STG Able To Perform Incision/Wound Care With min Assistance. Outcome: Completed/Met   Problem: RH SAFETY Goal: RH STG ADHERE TO SAFETY PRECAUTIONS W/ASSISTANCE/DEVICE Description: STG Adhere to Safety Precautions With min Assistance and appropriate assistive Device. Outcome: Completed/Met   Problem: RH PAIN MANAGEMENT Goal: RH STG PAIN MANAGED AT OR BELOW PT'S PAIN GOAL Description: <4 on a 0-10 pain scale Outcome: Completed/Met   Problem: RH KNOWLEDGE DEFICIT GENERAL Goal: RH STG INCREASE KNOWLEDGE OF SELF CARE AFTER HOSPITALIZATION Description: Patient and caregiver will be able to demonstrate knowledge of medication management, skin care, mobility safety, and follow up care with the MD post  discharge from CIR with min assist from staff. Outcome: Completed/Met

## 2020-07-05 NOTE — Progress Notes (Signed)
[place] PHYSICAL MEDICINE & REHABILITATION PROGRESS NOTE   Subjective/Complaints:  D/c planned for today- pt ready  Pt did great last night per pt and wife- slept 6 hours AND wasn't agitated at ALL!!! Much different last night than last few nights.    ROS:   Pt denies SOB, abd pain, CP, N/V/C/D, and vision changes   Objective:   No results found. Recent Labs    07/04/20 0941  WBC 7.5  HGB 11.9*  HCT 36.0*  PLT 236   Recent Labs    07/04/20 0941  NA 138  K 4.2  CL 105  CO2 23  GLUCOSE 152*  BUN 19  CREATININE 0.73  CALCIUM 9.0    Intake/Output Summary (Last 24 hours) at 07/05/2020 0901 Last data filed at 07/05/2020 0730 Gross per 24 hour  Intake 600 ml  Output 1 ml  Net 599 ml     Physical Exam: Vital Signs Blood pressure (!) 109/55, pulse 82, temperature 98.7 F (37.1 C), resp. rate 16, height 5\' 7"  (1.702 m), weight 77.1 kg, SpO2 99 %.  Constitutional: sitting up in bedside chair- RN in room, appropriate, NAD- wife at side HEENT: EOMI, oral membranes moist- head still sensitive Neck: supple Cardiovascular: RRR Respiratory/Chest: CTA B/L- no W/R/R- good air movement    GI/Abdomen: Soft, NT, ND, (+)BS  Ext: no clubbing, cyanosis, or edema Psych: pleasant and cooperative- more appropriate, better memory Neurological: Ox3 still Mild dysarthria but improving. Masked facies, sl rigid- no change. He does have a resting tremor esp in RUE- fast tremor- constantly- no changeMotor: 5/5 throughout      Assessment/Plan: 1. 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  Physiatrist is providing close team supervision and 24 hour management of active medical problems listed below.  Physiatrist and rehab team continue to assess barriers to discharge/monitor patient progress toward functional  and medical goals  Care Tool:  Bathing    Body parts bathed by patient: Chest, Abdomen, Front perineal area, Face, Right arm, Left arm, Buttocks, Right upper leg, Left upper leg, Right lower leg, Left lower leg   Body parts bathed by helper: Right arm, Left arm, Chest, Abdomen, Front perineal area, Buttocks, Right upper leg, Right lower leg, Left upper leg, Left lower leg, Face     Bathing assist Assist Level: Supervision/Verbal cueing     Upper Body Dressing/Undressing Upper body dressing   What is the patient wearing?: Pull over shirt    Upper body assist Assist Level: Set up assist    Lower Body Dressing/Undressing Lower body dressing      What is the patient wearing?: Underwear/pull up, Pants     Lower body assist Assist for lower body dressing: Supervision/Verbal cueing     Toileting Toileting    Toileting assist Assist for toileting: Supervision/Verbal cueing     Transfers Chair/bed transfer  Transfers assist     Chair/bed transfer assist level: Supervision/Verbal cueing Chair/bed transfer assistive device:   Ambulation assist      Assist level: Supervision/Verbal cueing Assistive device: No Device Max distance: +300'   Walk 10 feet activity   Assist     Assist level: Supervision/Verbal cueing Assistive device: No Device   Walk 50 feet activity   Assist    Assist level: Supervision/Verbal cueing Assistive device: No Device    Walk 150 feet activity  Assist    Assist level: Supervision/Verbal cueing Assistive device: No Device    Walk 10 feet on uneven surface  activity   Assist     Assist level: Supervision/Verbal cueing     Wheelchair     Assist Will patient use wheelchair at discharge?: No             Wheelchair 50 feet with 2 turns activity    Assist            Wheelchair 150 feet activity     Assist          Blood pressure (!) 109/55, pulse 82,  temperature 98.7 F (37.1 C), resp. rate 16, height 5\' 7"  (1.702 m), weight 77.1 kg, SpO2 99 %.  Medical Problem List and Plan: 1.Decreased functional mobilitysecondary to Parkinson's disease [facility] -patientmay notshower  8/13- will d/c staples- and then pt can shower  8/16- pt can shower- will remind OT  8/20- head looking great- almost healed  8/24- d/c tomorrow -ELOS/Goals: 07/05/20, Mod I/supervision Continue CIR 2. Antithrombotics:SCDs -DVT/anticoagulation:  -antiplatelet therapy: Aspirin 81 mg daily 3. Pain Management/chronicbackpain:Currently on home regimen of oxycodone 15 mg 4 times daily, Flexeril 10 mg 3 times daily as needed. Monitor mental status 8/20- meds being held due to no complaints of pain/orientation 4. Mood:Ambien as prior to admission decreased to 5 mg nightly -antipsychotic agents: Seroquel 12.5 mg nightly as needed  8/20- will increase Seroquel to 50 mg QHS and keep another seroquel 50 mg QHS prn- also maintain trazodone 100 mg QHS and keep off Ambien  8/22- made sure seroquel and trazodone given at 8pm. Pt responded well last night. Continue  8/23- pt slept from midnight to 6am- no agitation- con't meds  8/24- per wife request, will d/c Seroquel and change to Risperidone 0.5 mg at 8pm and prn if needed overnight. Con't trazodone 100 mg QHS  8/25- did MUCH better- send home on these meds 5. Neuropsych: This patientisNOTcapable of making decisions on hisown behalf. 6. Skin/Wound Care:Routine skin checks  Staples removed 8/13, healing well 7. Fluids/Electrolytes/Nutrition:Routine in and outs. CMP ordered for tomorrow a.m.  8/20- labs looks OK  8/23- will check labs in AM 8.Parkinson's disease. Continue Sinemet 25-100 mg 4 times daily as well as Artane 2 mg 3 times daily 9.  Hypertension. Lisinopril 5 mg daily.  Monitor with increased mobility. Well controlled  8/21- BP controlled- con't regimen 10. B12 deficiency. Complete 7-daycourse of vitamin B12 1000 mcg daily x7 days 11. Hypothyroidism. Continue Synthroid 12. D/C 8/25- explained to wife should NOT conitnue Ambien at home-  Can make confusion worse and make pts agitated- needs to f/u with [facility] as soon as can. Will have meds changed- also let nursing know to try and give prn if needed tonight.   8/25- d/w wife make sure he gets appointment with [facility] ASAP - since he's being d/c'd-      LOS: 14 days A FACE TO FACE EVALUATION WAS PERFORMED  Elye Harmsen 07/05/2020, 9:01 AM

## 2020-07-05 NOTE — Discharge Instructions (Signed)
Inpatient Rehab Discharge Instructions  Reginald Jenkins Discharge date and time: No discharge date for patient encounter.   Activities/Precautions/ Functional Status: Activity: activity as tolerated Diet: regular diet Wound Care: none needed Functional status:  ___ No restrictions     ___ Walk up steps independently ___ 24/7 supervision/assistance   ___ Walk up steps with assistance ___ Intermittent supervision/assistance  ___ Bathe/dress independently ___ Walk with walker     _x__ Bathe/dress with assistance ___ Walk Independently    ___ Shower independently ___ Walk with assistance    ___ Shower with assistance ___ No alcohol     ___ Return to work/school ________  COMMUNITY REFERRALS UPON DISCHARGE:    Home Health:   PT     OT    ST                Agency:  [facility]  Phone: [phone] *Please expect follow-up to schedule your home visit. If you have not receive follow-up within 2-3 days of your discharge, please contact the branch directly.*   Special Instructions:  Follow-up Dr. Myer Peer neurology services [facility]  No smoking driving or alcohol  My questions have been answered and I understand these instructions. I will adhere to these goals and the provided educational materials after my discharge from the hospital.  Patient/Caregiver Signature _______________________________ Date __________  Clinician Signature _______________________________________ Date __________  Please bring this form and your medication list with you to all your follow-up doctor's appointments.

## 2020-07-05 NOTE — Progress Notes (Signed)
Pt discharged home with family. D/c instructions given by Jesusita Oka, PA. Belongings sent with pt and family. No further questions from pt or family. Pt stable at time of discharge.    Marylu Lund, RN

## 2020-07-05 NOTE — Progress Notes (Addendum)
Inpatient Rehabilitation Care Coordinator  Discharge Note  The overall goal for the admission was met for:   Discharge location: Yes. D/c to home with 24/7 care from wife.   Length of Stay: Yes. 13 days.  Discharge activity level: Yes. Supervision to contact guard.  Home/community participation: Yes. Limited.   Services provided included: MD, RD, PT, OT, SLP, RN, CM, TR, Pharmacy, Neuropsych and SW  Financial Services: Medicare  Follow-up services arranged: Home Health: [facility] [phone] for HHPT/OT/SLP (resumption)  Comments (or additional information): contact pt wife Collie Siad #[phone]  Patient/Family verbalized understanding of follow-up arrangements: Yes  Individual responsible for coordination of the follow-up plan: Pt will have assistance with coordinating care needs.   Confirmed correct DME delivered: Rana Snare 07/05/2020    Rana Snare

## 2022-06-20 IMAGING — MR MR HEAD WO/W CM
9 of 13 series · 29 of 48 positions shown · IV contrast (gadavist)
Comparison: CT head 06/19/2020

CLINICAL DATA: Parkinson's. Deep brain stimulator placement
06/06/2020 in [REDACTED].

Postprocedural confusion and irritable behavior.
EXAM:
MRI HEAD WITHOUT AND WITH CONTRAST
MRA HEAD WITHOUT CONTRAST
TECHNIQUE: Multiplanar, multiecho pulse sequences of the brain and surrounding
structures were obtained without and with intravenous contrast.
Angiographic images of the head were obtained using MRA technique
without contrast.
CONTRAST:  7mL GADAVIST GADOBUTROL 1 MMOL/ML IV SOLN

[Series 2: DWI · axial · 3.0mm · 1.09mm/px · z∈[-28,+106]mm · 6 of 100 slices shown (1 of 4)]
[im 1/100]
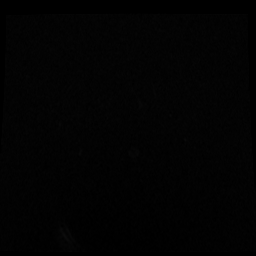
[im 20/100]
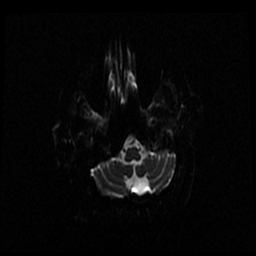
[im 40/100]
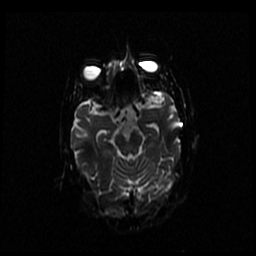
[im 60/100]
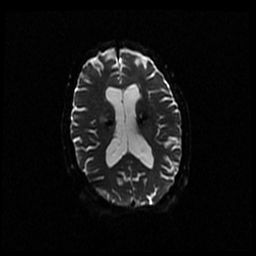
[im 80/100]
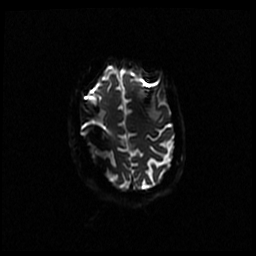
[im 100/100]
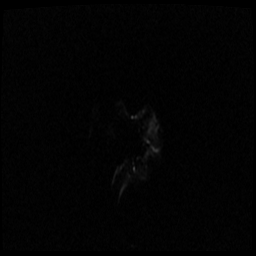

[Series 3: T1 · sagittal · 3.0mm · 0.47mm/px · 6 of 96 slices shown (1 of 3)]
[im 1/96]
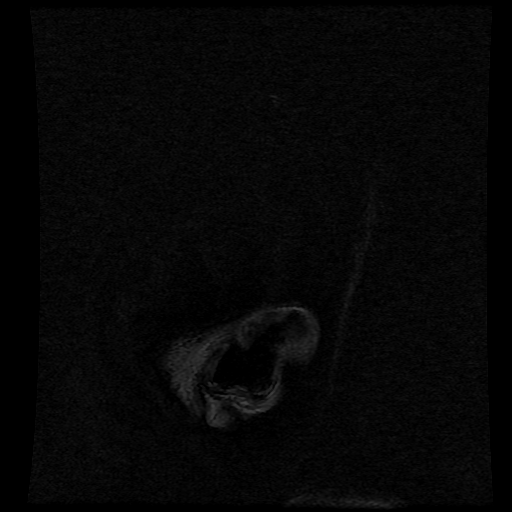
[im 20/96]
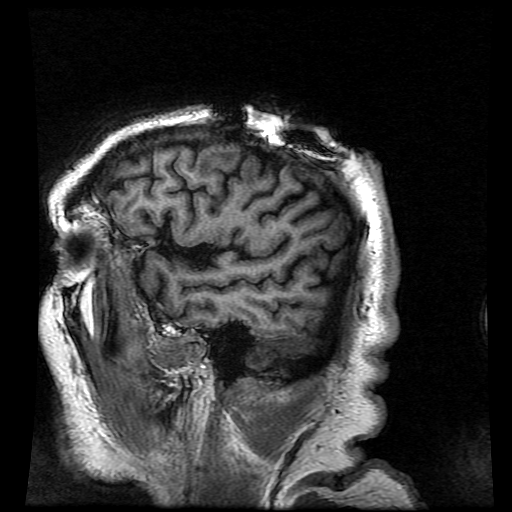
[im 39/96]
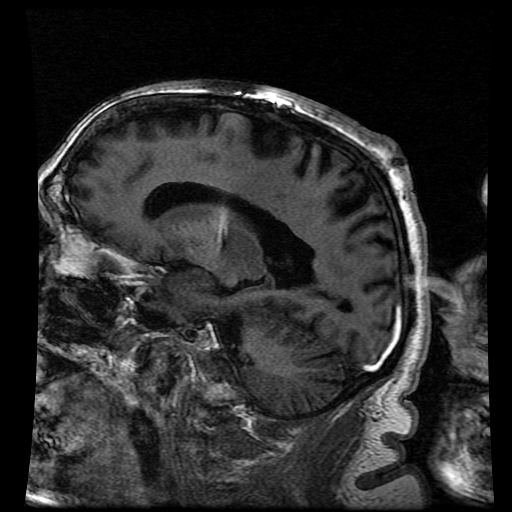
[im 58/96]
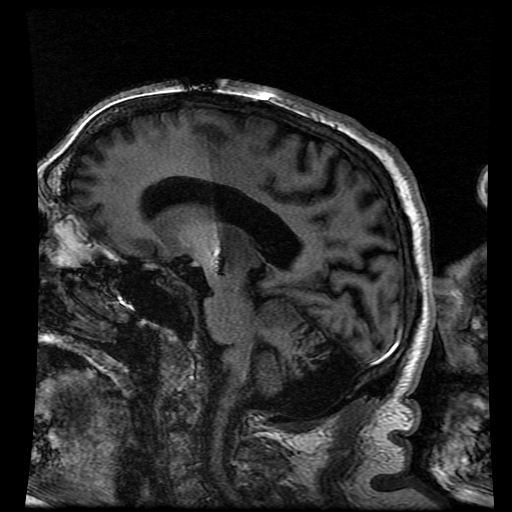
[im 77/96]
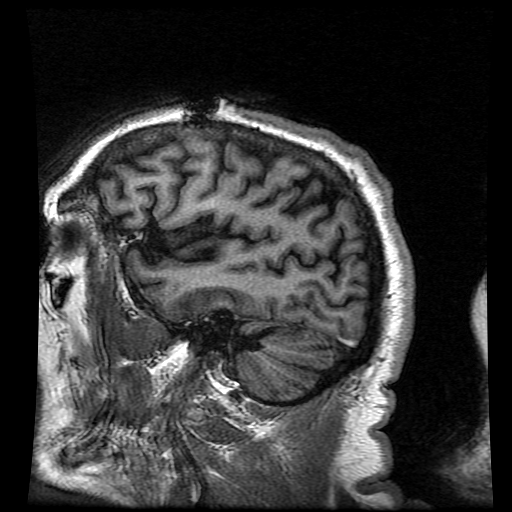
[im 96/96]
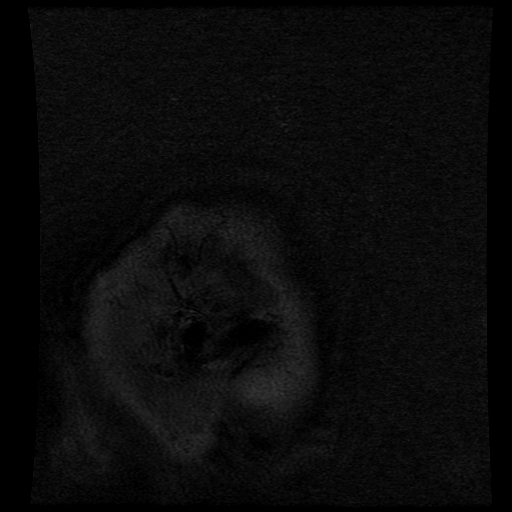

[Series 4: DWI · coronal · 5.0mm · 1.09mm/px · 4 of 74 slices shown (2 of 4)]
[im 1/74]
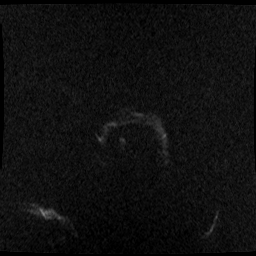
[im 25/74]
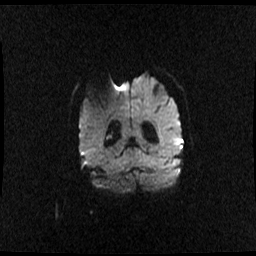
[im 49/74]
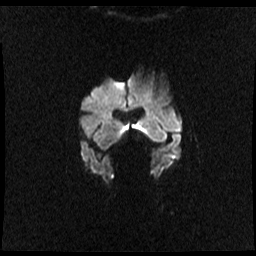
[im 74/74]
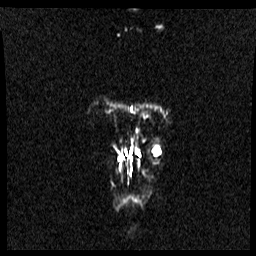

[Series 5: T2 · axial · 5.0mm · 0.86mm/px · 1 of 25 slices shown]
[im 1/25]
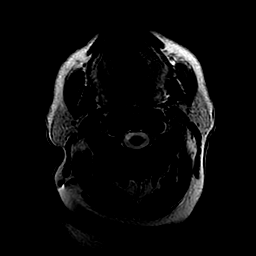

[Series 6: FLAIR · axial · 3.0mm · 0.43mm/px · 1 of 27 slices shown]
[im 1/27]
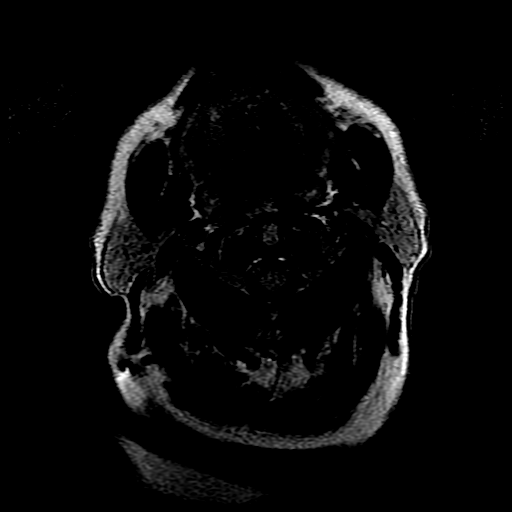

[Series 8: T1 · axial · 3.0mm · 0.47mm/px · z∈[-71,+75]mm · 5 of 100 slices shown (2 of 3)]
[im 1/100]
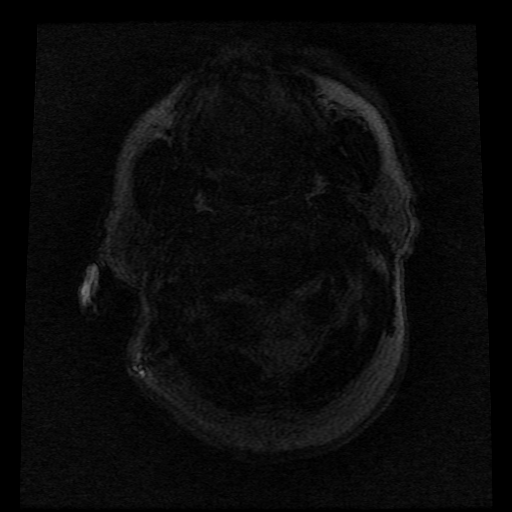
[im 25/100]
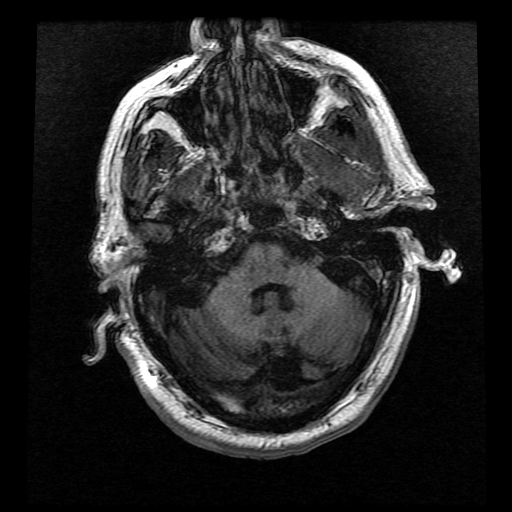
[im 50/100]
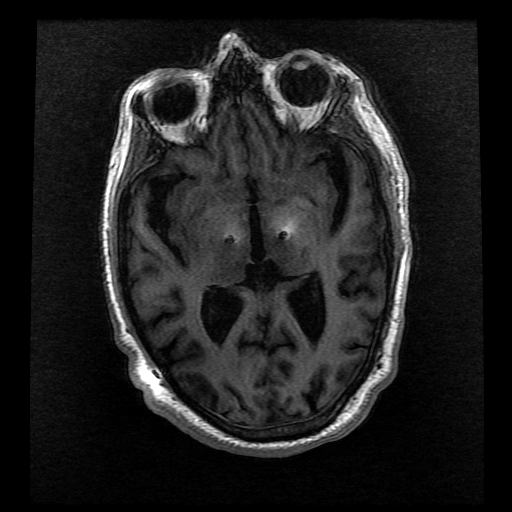
[im 75/100]
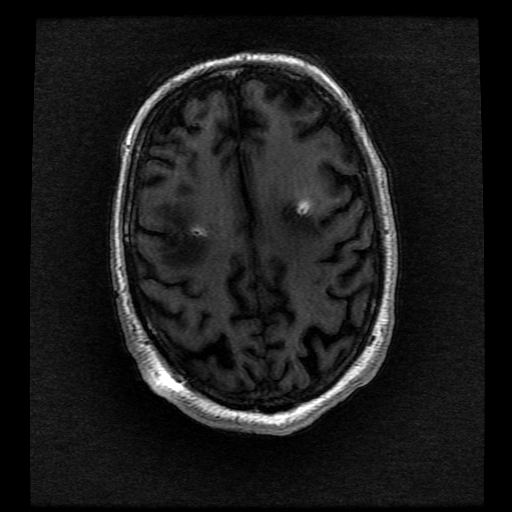
[im 100/100]
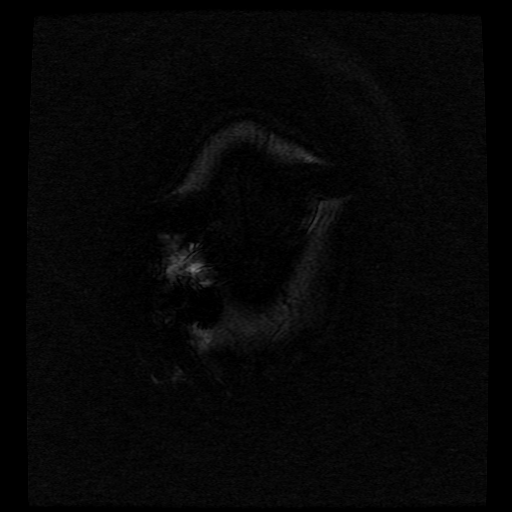

[Series 11: T1 · axial · 3.0mm · 0.47mm/px · z∈[-71,-36]mm · 2 of 100 slices shown (3 of 3)]
[im 1/100]
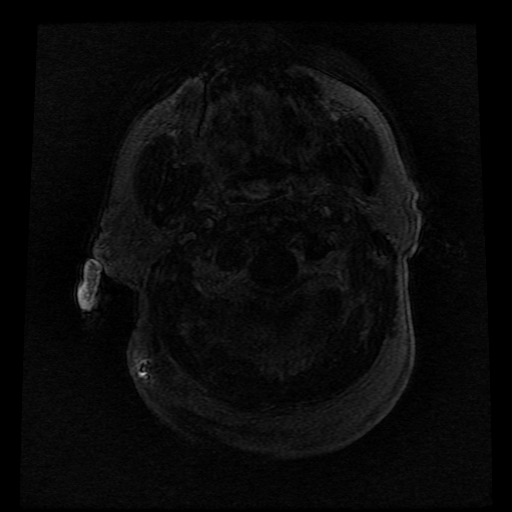
[im 25/100]
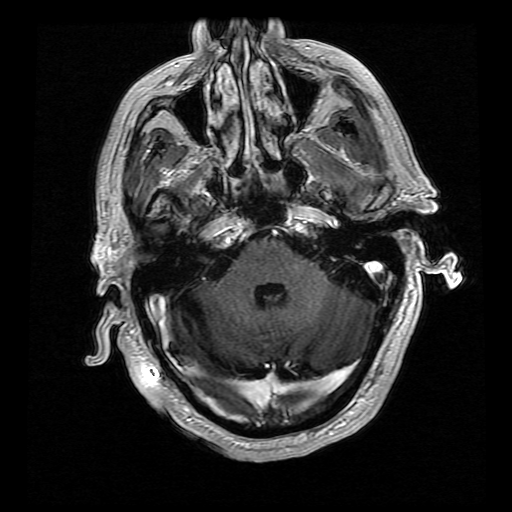

[Series 200: DWI · axial · 3.0mm · 1.09mm/px · z∈[-28,+106]mm · 2 of 43 slices shown (3 of 4)]
[im 1/43]
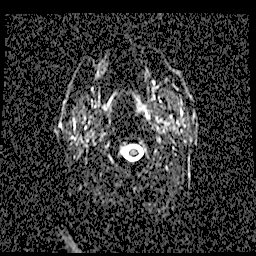
[im 43/43]
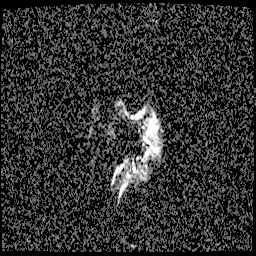

[Series 400: DWI · coronal · 5.0mm · 1.09mm/px · 2 of 37 slices shown (4 of 4)]
[im 1/37]
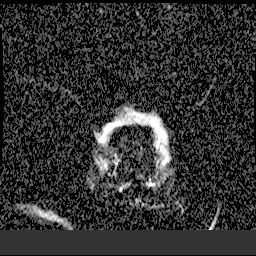
[im 37/37]
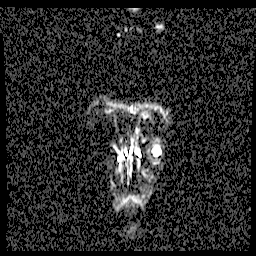

[29 of 48 positions shown; findings below may reference images not displayed]

FINDINGS: MRI HEAD FINDINGS

Brain: Bilateral deep brain stimulators extend through the thalamus
and into the midbrain bilaterally. There is moderate edema
surrounding both leads similar to that seen on the recent CT. No
associated restricted diffusion or hemorrhage in the edema.
Following contrast infusion, no abnormal enhancement is seen
surrounding the leads to suggest infection.

Ventricle size normal. Generalized atrophy. Negative for acute
infarct. No mass lesion.

There are small posterior subdural hematomas bilaterally in the
occipital region. These contain methemoglobin suggesting subacute
duration and measure approximately 3 mm each. There is also a small
amount of blood extending along the falx posteriorly.

Vascular: Normal arterial flow voids

Skull and upper cervical spine: No focal skeletal lesion.

Sinuses/Orbits: Mild mucosal edema paranasal sinuses. Negative orbit

Other: None

MRA HEAD FINDINGS

Right vertebral artery dominant. Both vertebral arteries contribute
to the basilar but mostly on the right. PICA patent bilaterally.
Basilar patent. AICA, superior cerebellar, posterior cerebral
arteries patent bilaterally fetal origin left posterior cerebral
artery. Decreased signal in the right posterior cerebral artery is
most likely related to artifact.

Internal carotid artery widely patent bilaterally. Anterior and
middle cerebral arteries patent bilaterally. Decreased signal in the
middle cerebral arteries bilaterally due to artifact.

Negative for cerebral aneurysm.
IMPRESSION: 1. Edema surrounding the deep brain stimulator leads bilaterally. No
restricted diffusion, hemorrhage, or enhancement to suggest
infection around the leads.
2. Small occipital subdural hematoma bilaterally which appears
subacute with methemoglobin.
3. Negative for acute infarct.
4. Negative MRA

## 2023-02-25 ENCOUNTER — Observation Stay (HOSPITAL_COMMUNITY): Payer: No Typology Code available for payment source

## 2023-02-25 ENCOUNTER — Emergency Department (HOSPITAL_COMMUNITY): Payer: No Typology Code available for payment source

## 2023-02-25 ENCOUNTER — Other Ambulatory Visit: Payer: Self-pay

## 2023-02-25 ENCOUNTER — Encounter (HOSPITAL_COMMUNITY): Payer: Self-pay | Admitting: Internal Medicine

## 2023-02-25 ENCOUNTER — Observation Stay (HOSPITAL_COMMUNITY)
Admission: EM | Admit: 2023-02-25 | Discharge: 2023-02-26 | Disposition: A | Discharge status: Home / Self Care | Payer: No Typology Code available for payment source | Attending: Family Medicine | Admitting: Family Medicine

## 2023-02-25 DIAGNOSIS — I10 Essential (primary) hypertension: Secondary | ICD-10-CM | POA: Diagnosis not present

## 2023-02-25 DIAGNOSIS — G20C Parkinsonism, unspecified: Secondary | ICD-10-CM | POA: Insufficient documentation

## 2023-02-25 DIAGNOSIS — Z87891 Personal history of nicotine dependence: Secondary | ICD-10-CM | POA: Insufficient documentation

## 2023-02-25 DIAGNOSIS — G459 Transient cerebral ischemic attack, unspecified: Secondary | ICD-10-CM | POA: Diagnosis not present

## 2023-02-25 DIAGNOSIS — Z96612 Presence of left artificial shoulder joint: Secondary | ICD-10-CM | POA: Diagnosis not present

## 2023-02-25 DIAGNOSIS — Z9689 Presence of other specified functional implants: Secondary | ICD-10-CM

## 2023-02-25 DIAGNOSIS — R4701 Aphasia: Secondary | ICD-10-CM | POA: Diagnosis present

## 2023-02-25 DIAGNOSIS — Z9682 Presence of neurostimulator: Secondary | ICD-10-CM | POA: Diagnosis not present

## 2023-02-25 DIAGNOSIS — G20A1 Parkinson's disease without dyskinesia, without mention of fluctuations: Secondary | ICD-10-CM | POA: Diagnosis present

## 2023-02-25 DIAGNOSIS — E039 Hypothyroidism, unspecified: Secondary | ICD-10-CM | POA: Diagnosis present

## 2023-02-25 DIAGNOSIS — Z79899 Other long term (current) drug therapy: Secondary | ICD-10-CM | POA: Diagnosis not present

## 2023-02-25 DIAGNOSIS — Z66 Do not resuscitate: Secondary | ICD-10-CM | POA: Diagnosis not present

## 2023-02-25 LAB — DIFFERENTIAL
Abs Immature Granulocytes: 0.03 10*3/uL (ref 0.00–0.07)
Basophils Absolute: 0 10*3/uL (ref 0.0–0.1)
Basophils Relative: 0 %
Eosinophils Absolute: 0 10*3/uL (ref 0.0–0.5)
Eosinophils Relative: 0 %
Immature Granulocytes: 0 %
Lymphocytes Relative: 28 %
Lymphs Abs: 2 10*3/uL (ref 0.7–4.0)
Monocytes Absolute: 0.8 10*3/uL (ref 0.1–1.0)
Monocytes Relative: 10 %
Neutro Abs: 4.6 10*3/uL (ref 1.7–7.7)
Neutrophils Relative %: 62 %

## 2023-02-25 LAB — I-STAT CHEM 8, ED
BUN: 16 mg/dL (ref 8–23)
Calcium, Ion: 1.1 mmol/L — ABNORMAL LOW (ref 1.15–1.40)
Chloride: 104 mmol/L (ref 98–111)
Creatinine, Ser: 0.6 mg/dL — ABNORMAL LOW (ref 0.61–1.24)
Glucose, Bld: 93 mg/dL (ref 70–99)
HCT: 44 % (ref 39.0–52.0)
Hemoglobin: 15 g/dL (ref 13.0–17.0)
Potassium: 4.1 mmol/L (ref 3.5–5.1)
Sodium: 137 mmol/L (ref 135–145)
TCO2: 23 mmol/L (ref 22–32)

## 2023-02-25 LAB — COMPREHENSIVE METABOLIC PANEL
ALT: 7 U/L (ref 0–44)
AST: 21 U/L (ref 15–41)
Albumin: 4 g/dL (ref 3.5–5.0)
Alkaline Phosphatase: 114 U/L (ref 38–126)
Anion gap: 11 (ref 5–15)
BUN: 16 mg/dL (ref 8–23)
CO2: 21 mmol/L — ABNORMAL LOW (ref 22–32)
Calcium: 8.7 mg/dL — ABNORMAL LOW (ref 8.9–10.3)
Chloride: 103 mmol/L (ref 98–111)
Creatinine, Ser: 0.74 mg/dL (ref 0.61–1.24)
GFR, Estimated: >60 mL/min (ref >60)
Glucose, Bld: 95 mg/dL (ref 70–99)
Potassium: 4.1 mmol/L (ref 3.5–5.1)
Sodium: 135 mmol/L (ref 135–145)
Total Bilirubin: 0.3 mg/dL (ref 0.3–1.2)
Total Protein: 6.7 g/dL (ref 6.5–8.1)

## 2023-02-25 LAB — CBC
HCT: 42.8 % (ref 39.0–52.0)
Hemoglobin: 14.6 g/dL (ref 13.0–17.0)
MCH: 30.1 pg (ref 26.0–34.0)
MCHC: 34.1 g/dL (ref 30.0–36.0)
MCV: 88.2 fL (ref 80.0–100.0)
Platelets: 215 10*3/uL (ref 150–400)
RBC: 4.85 MIL/uL (ref 4.22–5.81)
RDW: 13.2 % (ref 11.5–15.5)
WBC: 7.4 10*3/uL (ref 4.0–10.5)
nRBC: 0 % (ref 0.0–0.2)

## 2023-02-25 LAB — LIPID PANEL
Cholesterol: 183 mg/dL (ref 0–200)
HDL: 76 mg/dL (ref >40)
LDL Cholesterol: 93 mg/dL (ref 0–99)
Total CHOL/HDL Ratio: 2.4 RATIO
Triglycerides: 69 mg/dL (ref <150)
VLDL: 14 mg/dL (ref 0–40)

## 2023-02-25 LAB — PROTIME-INR
INR: 1 (ref 0.8–1.2)
Prothrombin Time: 13.2 seconds (ref 11.4–15.2)

## 2023-02-25 LAB — HEMOGLOBIN A1C
Hgb A1c MFr Bld: 5.7 % — ABNORMAL HIGH (ref 4.8–5.6)
Mean Plasma Glucose: 116.89 mg/dL

## 2023-02-25 LAB — ETHANOL: Alcohol, Ethyl (B): <10 mg/dL (ref <10)

## 2023-02-25 LAB — TSH: TSH: 1.794 u[IU]/mL (ref 0.350–4.500)

## 2023-02-25 LAB — APTT: aPTT: 27 seconds (ref 24–36)

## 2023-02-25 MED ORDER — STROKE: EARLY STAGES OF RECOVERY BOOK: Freq: Once | Status: AC

## 2023-02-25 MED ORDER — SODIUM CHLORIDE 0.9 % IV BOLUS: 1000.0000 mL | Freq: Once | INTRAVENOUS | Status: DC

## 2023-02-25 MED ORDER — CYCLOBENZAPRINE HCL 10 MG PO TABS: 10.0000 mg | ORAL_TABLET | Freq: Three times a day (TID) | ORAL | Status: DC | PRN

## 2023-02-25 MED ORDER — ATORVASTATIN CALCIUM 40 MG PO TABS
40.0000 mg | ORAL_TABLET | Freq: Every day | ORAL | Status: DC
  Administered 2023-02-26: 40 mg via ORAL
  Filled 2023-02-25: qty 1

## 2023-02-25 MED ORDER — TRIHEXYPHENIDYL HCL 2 MG PO TABS
2.0000 mg | ORAL_TABLET | Freq: Three times a day (TID) | ORAL | Status: DC
  Filled 2023-02-25 (×4): qty 1

## 2023-02-25 MED ORDER — ACETAMINOPHEN 650 MG RE SUPP: 650.0000 mg | RECTAL | Status: DC | PRN

## 2023-02-25 MED ORDER — CARBIDOPA-LEVODOPA 25-100 MG PO TABS
1.0000 | ORAL_TABLET | Freq: Four times a day (QID) | ORAL | Status: DC
  Administered 2023-02-26 (×2): 1 via ORAL
  Filled 2023-02-25 (×3): qty 1

## 2023-02-25 MED ORDER — ACETAMINOPHEN 500 MG PO TABS: 1000.0000 mg | ORAL_TABLET | Freq: Once | ORAL | Status: DC

## 2023-02-25 MED ORDER — DONEPEZIL HCL 10 MG PO TABS
10.0000 mg | ORAL_TABLET | Freq: Every day | ORAL | Status: DC
  Administered 2023-02-26: 10 mg via ORAL
  Filled 2023-02-25: qty 1

## 2023-02-25 MED ORDER — ACETAMINOPHEN 325 MG PO TABS: 650.0000 mg | ORAL_TABLET | ORAL | Status: DC | PRN

## 2023-02-25 MED ORDER — ASPIRIN 325 MG PO TABS
325.0000 mg | ORAL_TABLET | Freq: Every day | ORAL | Status: DC
  Filled 2023-02-25: qty 1

## 2023-02-25 MED ORDER — MELATONIN 5 MG PO TABS
10.0000 mg | ORAL_TABLET | Freq: Every day | ORAL | Status: DC
  Administered 2023-02-25: 10 mg via ORAL
  Filled 2023-02-25: qty 2

## 2023-02-25 MED ORDER — ACETAMINOPHEN 160 MG/5ML PO SOLN: 650.0000 mg | ORAL | Status: DC | PRN

## 2023-02-25 MED ORDER — LORATADINE 10 MG PO TABS
10.0000 mg | ORAL_TABLET | Freq: Every day | ORAL | Status: DC
  Administered 2023-02-26: 10 mg via ORAL
  Filled 2023-02-25: qty 1

## 2023-02-25 MED ORDER — CARBIDOPA-LEVODOPA ER 50-200 MG PO TBCR
1.0000 | EXTENDED_RELEASE_TABLET | Freq: Every day | ORAL | Status: DC
  Administered 2023-02-25: 1 via ORAL
  Filled 2023-02-25 (×2): qty 1

## 2023-02-25 MED ORDER — CLOPIDOGREL BISULFATE 75 MG PO TABS
75.0000 mg | ORAL_TABLET | Freq: Every day | ORAL | Status: DC
  Administered 2023-02-25 – 2023-02-26 (×2): 75 mg via ORAL
  Filled 2023-02-25 (×2): qty 1

## 2023-02-25 MED ORDER — KETOTIFEN FUMARATE 0.035 % OP SOLN: 1.0000 [drp] | Freq: Two times a day (BID) | OPHTHALMIC | Status: DC | PRN

## 2023-02-25 MED ORDER — ARTIFICIAL TEARS OPHTHALMIC OINT
1.0000 | TOPICAL_OINTMENT | Freq: Two times a day (BID) | OPHTHALMIC | Status: DC
  Administered 2023-02-26: 1 via OPHTHALMIC
  Filled 2023-02-25: qty 3.5

## 2023-02-25 MED ORDER — LEVOTHYROXINE SODIUM 112 MCG PO TABS
112.0000 ug | ORAL_TABLET | Freq: Every day | ORAL | Status: DC
  Administered 2023-02-26: 112 ug via ORAL
  Filled 2023-02-25: qty 1

## 2023-02-25 MED ORDER — TRAZODONE HCL 100 MG PO TABS
100.0000 mg | ORAL_TABLET | Freq: Every day | ORAL | Status: DC
  Administered 2023-02-25: 100 mg via ORAL
  Filled 2023-02-25: qty 1

## 2023-02-25 MED ORDER — SENNOSIDES-DOCUSATE SODIUM 8.6-50 MG PO TABS: 1.0000 | ORAL_TABLET | Freq: Every evening | ORAL | Status: DC | PRN

## 2023-02-25 MED ORDER — IOHEXOL 350 MG/ML SOLN
75.0000 mL | Freq: Once | INTRAVENOUS | Status: AC | PRN
  Administered 2023-02-25: 75 mL via INTRAVENOUS

## 2023-02-25 MED ORDER — ENOXAPARIN SODIUM 40 MG/0.4ML IJ SOSY
40.0000 mg | PREFILLED_SYRINGE | INTRAMUSCULAR | Status: DC
  Administered 2023-02-25: 40 mg via SUBCUTANEOUS
  Filled 2023-02-25: qty 0.4

## 2023-02-25 MED ORDER — PANTOPRAZOLE SODIUM 40 MG PO TBEC
40.0000 mg | DELAYED_RELEASE_TABLET | Freq: Every day | ORAL | Status: DC
  Administered 2023-02-25: 40 mg via ORAL
  Filled 2023-02-25: qty 1

## 2023-02-25 NOTE — ED Triage Notes (Signed)
Pt's wife reports yesterday at 1430 patient had sudden onset of the worst headache of his life and 25-minute episode of expressive aphasia. Symptoms, including headache, all resolved and patient back to his baseline. No hx TIA or stroke.

## 2023-02-25 NOTE — Consult Note (Signed)
Neurology Consultation  Reason for Consult: Transient aphasia Referring Physician: Dr. Ophelia Charter  CC: Transient aphasia  History is obtained from: Patient, chart  HPI: Reginald Jenkins is a 77 y.o. male past medical history of Parkinson's status post bilateral DBS placement, hypertension, hypothyroidism, anxiety, presented to the emergency room today for an episode of difficulty with speech that lasted about 25 minutes yesterday.  Wife reports that they were held to the store and some around 2:30 PM yesterday, he reported a sudden onset of severe headache followed by being awake and not being able to bring his words out.  This lasted about 25 minutes and then resolved.  The headache also resolved without any medication.  No chest pain shortness of breath.  No visual symptoms.  No prior history of similar episodes.  He had been feeling somewhat unwell the week preceding with upper respiratory infection symptoms but no history of fevers. Admitted for TIA workup.  Neurological consultation for recommendations on workup and management.   LKW: 2:30 PM on 02/24/2023-symptoms lasted 25 minutes and completely resolved.  Presented to the ER a day later. IV thrombolysis given?: no, symptoms resolved, outside the window Premorbid modified Rankin scale (mRS): 3 ROS: Full ROS was performed and is negative except as noted in the HPI.    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 No pertinent family history.   Social History:   reports that he quit smoking about 56 years ago. His smoking use included cigarettes. He has a 1.50 pack-year smoking history. He quit smokeless tobacco use about 34 years ago.  His smokeless tobacco use included  chew. He reports that he does not drink alcohol and does not use drugs.  Medications  Current Facility-Administered Medications:    acetaminophen (TYLENOL) tablet 650 mg, 650 mg, Oral, Q4H PRN **OR** acetaminophen (TYLENOL) 160 MG/5ML solution 650 mg, 650 mg, Per Tube, Q4H PRN **OR** acetaminophen (TYLENOL) suppository 650 mg, 650 mg, Rectal, Q4H PRN, Jonah Blue, MD   artificial tears (LACRILUBE) ophthalmic ointment 1 Application, 1 Application, Both Eyes, BID, Jonah Blue, MD   aspirin tablet 325 mg, 325 mg, Oral, Daily, Jonah Blue, MD   atorvastatin (LIPITOR) tablet 40 mg, 40 mg, Oral, Daily, Jonah Blue, MD   carbidopa-levodopa (SINEMET CR) 50-200 MG per tablet controlled release 1 tablet, 1 tablet, Oral, QHS, Jonah Blue, MD   carbidopa-levodopa (SINEMET IR) 25-100 MG per tablet immediate release 1 tablet, 1 tablet, Oral, QID, Jonah Blue, MD   cyclobenzaprine (FLEXERIL) tablet 10 mg, 10 mg, Oral, TID PRN, Jonah Blue, MD   [START ON 02/26/2023] donepezil (ARICEPT) tablet 10 mg, 10 mg, Oral, Daily, Jonah Blue, MD   enoxaparin (LOVENOX) injection 40 mg, 40 mg, Subcutaneous, Q24H, Jonah Blue, MD   ketotifen (ZADITOR) 0.035 % ophthalmic solution 1 drop, 1 drop, Both Eyes, BID PRN, Jonah Blue, MD   [START ON 02/26/2023] levothyroxine (SYNTHROID) tablet 112 mcg, 112 mcg, Oral, QAC breakfast, Jonah Blue, MD   [START ON 02/26/2023] loratadine (CLARITIN) tablet 10 mg, 10 mg, Oral, Daily, Jonah Blue, MD   melatonin tablet 10 mg, 10 mg, Oral, QHS, Jonah Blue,  MD   pantoprazole (PROTONIX) EC tablet 40 mg, 40 mg, Oral, QHS, Jonah Blue, MD   senna-docusate (Senokot-S) tablet 1 tablet, 1 tablet, Oral, QHS PRN, Jonah Blue, MD   traZODone (DESYREL) tablet 100 mg, 100 mg, Oral, QHS, Jonah Blue, MD   trihexyphenidyl (ARTANE) tablet 2 mg, 2 mg, Oral, TID WC, Jonah Blue, MD   Exam: Current vital signs: BP (!) 174/92 (BP Location:  Left Arm)   Pulse (!) 54   Temp 98 F (36.7 C) (Oral)   Resp 18   Ht 5\' 9"  (1.753 m)   SpO2 97%   BMI 25.10 kg/m  Vital signs in last 24 hours: Temp:  [98 F (36.7 C)-98.5 F (36.9 C)] 98 F (36.7 C) (04/16 1802) Pulse Rate:  [54-68] 54 (04/16 1802) Resp:  [16-20] 18 (04/16 1802) BP: (149-196)/(74-99) 174/92 (04/16 1802) SpO2:  [96 %-99 %] 97 % (04/16 1802) General: Awake alert in no distress HEENT: Normocephalic atraumatic Lungs: Clear Cardiovascular: Regular rate rhythm Abdomen nondistended nontender Neurological exam Awake alert oriented x 3 Hypomimia No dysarthria No aphasia Cranial nerves: Pupils equal round reactive light, extraocular movements intact, visual fields full, no facial asymmetry.  Auditory acuity intact.  Tongue and palate midline. Motor examination: No drift in any of the 4 extremities.  There is resting tremor much more pronounced on the left and very subtle tremor on the right off-and-on. Sensory exam: Intact to light touch without extinction Coordination exam with no dysmetria.  There is resting tremor on the left upper extremity which is more prominent than a very subtle resting tremor in the right upper extremity. Gait testing was deferred at this time NIH stroke scale-0   Labs I have reviewed labs in epic and the results pertinent to this consultation are: CBC    Component Value Date/Time   WBC 7.4 02/25/2023 1122   RBC 4.85 02/25/2023 1122   HGB 15.0 02/25/2023 1154   HCT 44.0 02/25/2023 1154   PLT 215 02/25/2023 1122   MCV 88.2 02/25/2023 1122   MCH 30.1 02/25/2023 1122   MCHC 34.1 02/25/2023 1122   RDW 13.2 02/25/2023 1122   LYMPHSABS 2.0 02/25/2023 1122   MONOABS 0.8 02/25/2023 1122   EOSABS 0.0 02/25/2023 1122   BASOSABS 0.0 02/25/2023 1122    CMP     Component Value Date/Time   NA 137 02/25/2023 1154   K 4.1 02/25/2023 1154   CL 104 02/25/2023 1154   CO2 21 (L) 02/25/2023 1122   GLUCOSE 93 02/25/2023 1154   BUN 16  02/25/2023 1154   CREATININE 0.60 (L) 02/25/2023 1154   CALCIUM 8.7 (L) 02/25/2023 1122   PROT 6.7 02/25/2023 1122   ALBUMIN 4.0 02/25/2023 1122   AST 21 02/25/2023 1122   ALT 7 02/25/2023 1122   ALKPHOS 114 02/25/2023 1122   BILITOT 0.3 02/25/2023 1122   GFRNONAA >60 02/25/2023 1122   GFRAA >60 07/04/2020 0941    Lipid Panel     Component Value Date/Time   CHOL 183 02/25/2023 1648   TRIG 69 02/25/2023 1648   HDL 76 02/25/2023 1648   CHOLHDL 2.4 02/25/2023 1648   VLDL 14 02/25/2023 1648   LDLCALC 93 02/25/2023 1648    A1c 5.7  Imaging I have reviewed the images obtained:  CT-head- NAICP CTA head and neck - no ELVO  MRI examination of the brain-no acute changes.  DBS leads in bilateral subthalamic nuclei  Assessment: 77 year old man with above past medical history presenting for 25 minutes  episode of word finding difficulty and inability to bring his words out.  Symptoms concerning either for a freezing spell associated with Parkinson's disease or given his age and risk factors, could reflect a left hemispheric TIA. He would benefit from a stroke risk factor workup.   Recommendations: Agree with hospitalist admission Telemetry Frequent neurochecks Aspirin 81+ Plavix 75 mg now and daily-he is on no antiplatelets at home I would recommend dual antiplatelet for 3 weeks followed by aspirin only High intensity statin-LDL goal should be less than 70. 2D echo A1c at goal PT OT ST Continue outpatient follow-up with the [facility] neurologist in [place] upon discharge. Continue parkinsonian medication regimen as recommended by the outpatient neurologist Stroke team will follow-up the echocardiogram and update any findings as necessary Plan was discussed with the family at bedside in detail.   -- Milon Dikes, MD Neurologist [facility] Pager: [phone]

## 2023-02-25 NOTE — ED Notes (Signed)
Diagnostic Criteria for Hypermobile Ehlers-Danlos Syndrome (hEDS)   No data filed   ED TO INPATIENT HANDOFF REPORT  ED Nurse Name and Phone #: Lowanda Foster ext [phone]  S Name/Age/Gender Reginald Jenkins 77 y.o. male Room/Bed: 043C/043C  Code Status   Code Status: DNR  Home/SNF/Other Home Patient oriented to: self, place, time, and situation Is this baseline? Yes   Triage Complete: Triage complete  Chief Complaint TIA (transient ischemic attack) [G45.9]  Triage Note Pt's wife reports yesterday at 1430 patient had sudden onset of the worst headache of his life and 25-minute episode of expressive aphasia. Symptoms, including headache, all resolved and patient back to his baseline. No hx TIA or stroke.    Allergies No Known Allergies  Level of Care/Admitting Diagnosis ED Disposition     ED Disposition  Admit   Condition  --   Comment  Hospital Area: [facility] [[id number]]  Level of Care: Telemetry Medical [[id number]]  May place patient in observation at [facility] or [facility] if equivalent level of care is available:: No  Covid Evaluation: Asymptomatic - no recent exposure (last 10 days) testing not required  Diagnosis: TIA (transient ischemic attack) [191478]  Admitting Physician: Jonah Blue [[id number]]  Attending Physician: Jonah Blue [[id number]]          B Medical/Surgery History 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   Arthritis    Broken collarbone    as a child   Dyspnea    when climbing stairs    GERD (gastroesophageal reflux disease)    Hypertension    Hypothyroidism    Parkinson disease 2010   Past Surgical History:  Procedure Laterality Date   APPENDECTOMY     arm surgery Left 1967   a rod was put in then 8 months it was removed   CHOLECYSTECTOMY     LAMINECTOMY  1986   ROTATOR CUFF REPAIR Right 2001   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   Procedure: LEFT  TOTAL SHOULDER ARTHROPLASTY;  Surgeon: Francena Hanly, MD;  Location: [facility] OR;  Service: Orthopedics;  Laterality: Left;     A IV Location/Drains/Wounds Patient Lines/Drains/Airways Status     Active Line/Drains/Airways     Name Placement date Placement time Site Days   Incision (Closed) 06/18/20 Head Left;Upper;Right;Posterior;Medial;Bilateral 06/18/20  2000  -- 982            Intake/Output Last 24 hours No intake or output data in the 24 hours ending 02/25/23 1722  Labs/Imaging Results for orders placed or performed during the hospital encounter of 02/25/23 (from the past 48 hour(s))  Protime-INR     Status: None   Collection Time: 02/25/23 11:22 AM  Result Value Ref Range   Prothrombin Time 13.2 11.4 - 15.2 seconds   INR 1.0 0.8 - 1.2    Comment: (NOTE) INR goal varies based on device and disease states. Performed at [facility], [address] [place], [place] [address]   APTT     Status: None   Collection Time: 02/25/23 11:22 AM  Result Value Ref Range   aPTT 27 24 - 36 seconds    Comment: Performed at [facility], [address], [place], [place] [address]  CBC     Status: None   Collection Time: 02/25/23 11:22 AM  Result Value Ref Range   WBC 7.4 4.0 - 10.5 K/uL  RBC 4.85 4.22 - 5.81 MIL/uL   Hemoglobin 14.6 13.0 - 17.0 g/dL   HCT 41.3 24.4 - 01.0 %   MCV 88.2 80.0 - 100.0 fL   MCH 30.1 26.0 - 34.0 pg   MCHC 34.1 30.0 - 36.0 g/dL   RDW 27.2 53.6 - 64.4 %   Platelets 215 150 - 400 K/uL   nRBC 0.0 0.0 - 0.2 %    Comment: Performed at [facility], [address], [place], [place] [address]  Differential     Status: None   Collection Time: 02/25/23 11:22 AM  Result Value Ref Range   Neutrophils Relative % 62 %   Neutro Abs 4.6 1.7 - 7.7 K/uL   Lymphocytes Relative 28 %   Lymphs Abs 2.0 0.7 - 4.0 K/uL   Monocytes Relative 10 %   Monocytes Absolute 0.8 0.1 - 1.0 K/uL   Eosinophils Relative 0 %   Eosinophils Absolute 0.0 0.0 - 0.5 K/uL    Basophils Relative 0 %   Basophils Absolute 0.0 0.0 - 0.1 K/uL   Immature Granulocytes 0 %   Abs Immature Granulocytes 0.03 0.00 - 0.07 K/uL    Comment: Performed at [facility], [address], [place], [place] [address]  Comprehensive metabolic panel     Status: Abnormal   Collection Time: 02/25/23 11:22 AM  Result Value Ref Range   Sodium 135 135 - 145 mmol/L   Potassium 4.1 3.5 - 5.1 mmol/L   Chloride 103 98 - 111 mmol/L   CO2 21 (L) 22 - 32 mmol/L   Glucose, Bld 95 70 - 99 mg/dL    Comment: Glucose reference range applies only to samples taken after fasting for at least 8 hours.   BUN 16 8 - 23 mg/dL   Creatinine, Ser 3.87 0.61 - 1.24 mg/dL   Calcium 8.7 (L) 8.9 - 10.3 mg/dL   Total Protein 6.7 6.5 - 8.1 g/dL   Albumin 4.0 3.5 - 5.0 g/dL   AST 21 15 - 41 U/L   ALT 7 0 - 44 U/L   Alkaline Phosphatase 114 38 - 126 U/L   Total Bilirubin 0.3 0.3 - 1.2 mg/dL   GFR, Estimated >56 >43 mL/min    Comment: (NOTE) Calculated using the CKD-EPI Creatinine Equation (2021)    Anion gap 11 5 - 15    Comment: Performed at [facility], [address] [place], [place] [address]  Ethanol     Status: None   Collection Time: 02/25/23 11:22 AM  Result Value Ref Range   Alcohol, Ethyl (B) <10 <10 mg/dL    Comment: (NOTE) Lowest detectable limit for serum alcohol is 10 mg/dL.  For medical purposes only. Performed at [facility], [address], [place], [place] [address]   I-stat chem 8, ED     Status: Abnormal   Collection Time: 02/25/23 11:54 AM  Result Value Ref Range   Sodium 137 135 - 145 mmol/L   Potassium 4.1 3.5 - 5.1 mmol/L   Chloride 104 98 - 111 mmol/L   BUN 16 8 - 23 mg/dL   Creatinine, Ser 6.06 (L) 0.61 - 1.24 mg/dL   Glucose, Bld 93 70 - 99 mg/dL    Comment: Glucose reference range applies only to samples taken after fasting for at least 8 hours.   Calcium, Ion 1.10 (L) 1.15 - 1.40 mmol/L   TCO2 23 22 - 32 mmol/L   Hemoglobin 15.0 13.0 -  17.0  g/dL   HCT 09.8 11.9 - 14.7 %   MR BRAIN WO CONTRAST  Result Date: 02/25/2023 CLINICAL DATA:  Stroke suspected EXAM: MRI HEAD WITHOUT CONTRAST TECHNIQUE: Multiplanar, multiecho pulse sequences of the brain and surrounding structures were obtained without intravenous contrast. COMPARISON:  Same day CT head, MR Head 06/21/20 FINDINGS: Brain: No acute infarction, hemorrhage, hydrocephalus, extra-axial collection or mass lesion.Bilateral DBS leads in place with the leads positioned near the subthalamic nuclei. Mega cisterna magna. Vascular: Normal flow voids. Skull and upper cervical spine: Normal marrow signal. Sinuses/Orbits: Mild pan sinus mucosal thickening.Orbits are unremarkable. No middle ear or mastoid effusion. Other: None. IMPRESSION: 1. No acute intracranial abnormality. 2. Bilateral DBS leads in place with the leads positioned near the subthalamic nuclei. Electronically Signed   By: Lorenza Cambridge M.D.   On: 02/25/2023 15:59   CT HEAD WO CONTRAST  Result Date: 02/25/2023 CLINICAL DATA:  Transient ischemic attack. EXAM: CT HEAD WITHOUT CONTRAST TECHNIQUE: Contiguous axial images were obtained from the base of the skull through the vertex without intravenous contrast. RADIATION DOSE REDUCTION: This exam was performed according to the departmental dose-optimization program which includes automated exposure control, adjustment of the mA and/or kV according to patient size and/or use of iterative reconstruction technique. COMPARISON:  Head CT 06/19/2020. FINDINGS: Brain: Unchanged bilateral deep brain stimulator electrodes targeting the subthalamic nuclei. Interval resolution of lead edema. Within the limitations of streak artifact from DBS leads, no acute intracranial hemorrhage or loss of cortical gray-white differentiation. No hydrocephalus or extra-axial collection. No mass effect or midline shift. Vascular: No hyperdense vessel or unexpected calcification. Skull: Bilateral parietal burr holes.  No  calvarial fracture. Sinuses/Orbits: Unremarkable. Other: None. IMPRESSION: 1. No acute intracranial abnormality. 2. Unchanged bilateral deep brain stimulator electrodes. Electronically Signed   By: Orvan Falconer M.D.   On: 02/25/2023 11:57    Pending Labs Unresulted Labs (From admission, onward)     Start     Ordered   02/25/23 1648  Lipid panel  (Labs)  Once,   R       Comments: Fasting    02/25/23 1649   02/25/23 1648  TSH  Once,   R        02/25/23 1649   02/25/23 1646  Hemoglobin A1c  (Labs)  Once,   R       Comments: To assess prior glycemic control    02/25/23 1649            Vitals/Pain Today's Vitals   02/25/23 1349 02/25/23 1352 02/25/23 1700 02/25/23 1715  BP: (!) 189/88  (!) 149/74 (!) 156/78  Pulse:   (!) 54 60  Resp: Temp: 98.5 F (36.9 C) 98.5 F (36.9 C)    TempSrc: Oral Oral    SpO2: 97%  99% 98%  PainSc:        Isolation Precautions No active isolations  Medications Medications  aspirin tablet 325 mg (has no administration in time range)  carbidopa-levodopa (SINEMET IR) 25-100 MG per tablet immediate release 1 tablet (has no administration in time range)  trihexyphenidyl (ARTANE) tablet 2 mg (has no administration in time range)  cyclobenzaprine (FLEXERIL) tablet 10 mg (has no administration in time range)  enoxaparin (LOVENOX) injection 40 mg (has no administration in time range)   stroke: early stages of recovery book (has no administration in time range)  acetaminophen (TYLENOL) tablet 650 mg (has no administration in time range)    Or  acetaminophen (  TYLENOL) 160 MG/5ML solution 650 mg (has no administration in time range)    Or  acetaminophen (TYLENOL) suppository 650 mg (has no administration in time range)  senna-docusate (Senokot-S) tablet 1 tablet (has no administration in time range)    Mobility walks     Focused Assessments Neuro Assessment Handoff:  Swallow screen pass?  Patient can swallow. Does hold pills in  mouth and needs prompting.  Cardiac Rhythm: Normal sinus rhythm NIH Stroke Scale  Dizziness Present: No Headache Present: No Interval: Initial Level of Consciousness (1a.)   : Alert, keenly responsive LOC Questions (1b. )   : Answers both questions correctly LOC Commands (1c. )   : Performs both tasks correctly Best Gaze (2. )  : Normal Visual (3. )  : No visual loss Facial Palsy (4. )    : Normal symmetrical movements Motor Arm, Left (5a. )   : No drift Motor Arm, Right (5b. ) : No drift Motor Leg, Left (6a. )  : No drift Motor Leg, Right (6b. ) : No drift Limb Ataxia (7. ): Absent Sensory (8. )  : Normal, no sensory loss Best Language (9. )  : No aphasia Dysarthria (10. ): Normal Extinction/Inattention (11.)   : No Abnormality Complete NIHSS TOTAL: 0     Neuro Assessment: Within Defined Limits Neuro Checks:   Initial (02/25/23 1306)  Has TPA been given? No If patient is a Neuro Trauma and patient is going to OR before floor call report to 4N Charge nurse: [phone] or [phone]   R Recommendations: See Admitting Provider Note  Report given to:   Additional Note: Patient ambulatory.

## 2023-02-25 NOTE — ED Notes (Signed)
MRI notified that DBS has been turned off/family aware and MRI not until 5pm. DBS will remain on and family will turn off when ready for MRI

## 2023-02-25 NOTE — H&P (Signed)
History and Physical    Patient: Reginald Jenkins ZOX:[id number] DOB: 07-23-1946 DOA: 02/25/2023 DOS: the patient was seen and examined on 02/25/2023 PCP: Hyler, Reginald Limbo, NP  Patient coming from: Home - lives with wife; [name]: Wife, [phone]   Chief Complaint: Aphasia  HPI: Reginald Jenkins is a 77 y.o. male with medical history significant of HTN, hypothyroidism, and Parkinson's with deep brain stimulator presenting with transient headache and aphasia yesterday.  He was coming up the road and stopped at [employer].  His wife came out and he had a really bad headache.  He was talking to his wife and his words wouldn't come up - words were incoherent but he was making noises.  It lasted for 20 minutes.  He could say words then but they were slurred.  He started feeling better and the headache resolved spontaneously.  No further symptoms.  They turned off his DBS for the MRI but there seemed to be a glitch when they turned it back on.  No prior h/o CVA.      ER Course:  TIA vs. CVA.  Yesterday with transient aphasia with headache, refused to come yesterday.  Today with unremarkable exam.  CT ok.  Neuro recommends observation for TIA regardless of MRI.     Review of Systems: As mentioned in the history of present illness. All other systems reviewed and are negative. 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   Arthritis    Broken collarbone    as a child   Dyspnea    when climbing stairs    GERD (gastroesophageal reflux disease)    Hypertension    Hypothyroidism    Parkinson disease 2010   Past Surgical History:  Procedure Laterality Date   APPENDECTOMY     arm surgery Left 1967   a rod was put in then 8 months it was removed   CHOLECYSTECTOMY     LAMINECTOMY  1986   ROTATOR CUFF REPAIR Right 2001   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   Procedure: LEFT TOTAL SHOULDER ARTHROPLASTY;  Surgeon: Francena Hanly, MD;   Location: [facility] OR;  Service: Orthopedics;  Laterality: Left;   Social History:  reports that he quit smoking about 56 years ago. His smoking use included cigarettes. He has a 1.50 pack-year smoking history. He quit smokeless tobacco use about 34 years ago.  His smokeless tobacco use included chew. He reports that he does not drink alcohol and does not use drugs.  No Known Allergies  History reviewed. No pertinent family history.  Prior to Admission medications   Medication Sig Start Date End Date Taking? Authorizing Provider  acetaminophen (TYLENOL) 500 MG tablet Take 500 mg by mouth every 6 (six) hours as needed (for pain.).    [provider]  carbidopa-levodopa (SINEMET IR) 25-100 MG tablet Take 1 tablet by mouth 4 (four) times daily.    [provider]  cyclobenzaprine (FLEXERIL) 10 MG tablet Take 1 tablet (10 mg total) by mouth 3 (three) times daily as needed for muscle spasms. 07/04/20   Angiulli, Mcarthur Rossetti, PA-C  docusate sodium (COLACE) 100 MG capsule Take 100 mg by mouth 3 (three) times daily.    [provider]  ferrous sulfate 325 (65 FE) MG tablet Take 1 tablet (325 mg total) by mouth 2 (two) times daily. 07/04/20   Angiulli, Mcarthur Rossetti, PA-C  levothyroxine (SYNTHROID) 125 MCG tablet  Take 1 tablet (125 mcg total) by mouth daily before breakfast. 07/04/20 08/03/20  Angiulli, Mcarthur Rossetti, PA-C  lisinopril (ZESTRIL) 5 MG tablet Take 1 tablet (5 mg total) by mouth every evening. 07/04/20   Angiulli, Mcarthur Rossetti, PA-C  Multiple Vitamin (MULTIVITAMIN WITH MINERALS) TABS tablet Take 1 tablet by mouth daily. Centrum    [provider]  olopatadine (PATANOL) 0.1 % ophthalmic solution Place 1 drop into both eyes 2 (two) times daily as needed for allergies.    [provider]  oxyCODONE (ROXICODONE) 15 MG immediate release tablet Take 1 tablet (15 mg total) by mouth 2 (two) times daily as needed for severe pain. 07/04/20   Angiulli, Mcarthur Rossetti, PA-C  polyethylene glycol  (MIRALAX / GLYCOLAX) 17 g packet Take 17 g by mouth daily as needed for moderate constipation. 06/21/20   Arrien, York Ram, MD  risperiDONE (RISPERDAL) 0.5 MG tablet Take 1 tablet (0.5 mg total) by mouth at bedtime as needed (agitation). 07/04/20   Angiulli, Mcarthur Rossetti, PA-C  traZODone (DESYREL) 100 MG tablet Take 1 tablet (100 mg total) by mouth daily at 8 pm. 07/04/20   Angiulli, Mcarthur Rossetti, PA-C  trihexyphenidyl (ARTANE) 2 MG tablet Take 2 mg by mouth 3 (three) times daily with meals.     [provider]    Physical Exam: Vitals:   02/25/23 1700 02/25/23 1715 02/25/23 1802 02/25/23 1826  BP: (!) 149/74 (!) 156/78 (!) 174/92   Pulse: (!) 54 60 (!) 54   Resp: Temp:   98 F (36.7 C)   TempSrc:   Oral   SpO2: 99% 98% 97%   Height:     (1.753 m)   General:  Appears calm and comfortable and is in NAD Eyes:  R pupil is larger than L (family says this may be new), EOMI, normal lids, iris ENT:  grossly normal hearing, lips & tongue, mmm; some absent dentition Neck:  no LAD, masses or thyromegaly Cardiovascular:  RRR, no m/r/g. No LE edema.  Respiratory:   CTA bilaterally with no wheezes/rales/rhonchi.  Normal respiratory effort. Abdomen:  soft, NT, ND Skin:  no rash or induration seen on limited exam Musculoskeletal:  grossly normal tone BUE/BLE, good ROM, no bony abnormality Psychiatric:  blunted mood and affect, speech fluent and appropriate with some ?cognitive slowing, AOx3 Neurologic:  CN 2-12 grossly intact, moves all extremities in coordinated fashion, sensation intact, L>R hand tremor   Radiological Exams on Admission: Independently reviewed - see discussion in A/P where applicable  MR BRAIN WO CONTRAST  Result Date: 02/25/2023 CLINICAL DATA:  Stroke suspected EXAM: MRI HEAD WITHOUT CONTRAST TECHNIQUE: Multiplanar, multiecho pulse sequences of the brain and surrounding structures were obtained without intravenous contrast. COMPARISON:  Same day CT head,  MR Head 06/21/20 FINDINGS: Brain: No acute infarction, hemorrhage, hydrocephalus, extra-axial collection or mass lesion.Bilateral DBS leads in place with the leads positioned near the subthalamic nuclei. Mega cisterna magna. Vascular: Normal flow voids. Skull and upper cervical spine: Normal marrow signal. Sinuses/Orbits: Mild pan sinus mucosal thickening.Orbits are unremarkable. No middle ear or mastoid effusion. Other: None. IMPRESSION: 1. No acute intracranial abnormality. 2. Bilateral DBS leads in place with the leads positioned near the subthalamic nuclei. Electronically Signed   By: Lorenza Cambridge M.D.   On: 02/25/2023 15:59   CT HEAD WO CONTRAST  Result Date: 02/25/2023 CLINICAL DATA:  Transient ischemic attack. EXAM: CT HEAD WITHOUT CONTRAST TECHNIQUE: Contiguous axial images were obtained from the base  of the skull through the vertex without intravenous contrast. RADIATION DOSE REDUCTION: This exam was performed according to the departmental dose-optimization program which includes automated exposure control, adjustment of the mA and/or kV according to patient size and/or use of iterative reconstruction technique. COMPARISON:  Head CT 06/19/2020. FINDINGS: Brain: Unchanged bilateral deep brain stimulator electrodes targeting the subthalamic nuclei. Interval resolution of lead edema. Within the limitations of streak artifact from DBS leads, no acute intracranial hemorrhage or loss of cortical gray-white differentiation. No hydrocephalus or extra-axial collection. No mass effect or midline shift. Vascular: No hyperdense vessel or unexpected calcification. Skull: Bilateral parietal burr holes.  No calvarial fracture. Sinuses/Orbits: Unremarkable. Other: None. IMPRESSION: 1. No acute intracranial abnormality. 2. Unchanged bilateral deep brain stimulator electrodes. Electronically Signed   By: Orvan Falconer M.D.   On: 02/25/2023 11:57    EKG: Independently reviewed.  NSR with rate 59; nonspecific ST  changes with no evidence of acute ischemia   Labs on Admission: I have personally reviewed the available labs and imaging studies at the time of the admission.  Pertinent labs:    Unremarkable CMP Normal CBC INR 1.0 ETOH <10   Assessment and Plan: Principal Problem:   TIA (transient ischemic attack) Active Problems:   Parkinson's disease   HTN (hypertension)   Hypothyroidism   DNR (do not resuscitate)    TIA -Patient with transient episode yesterday of headache and aphasia, now resolved -Concerning for TIA/CVA -Aspirin has been given to reduce stroke mortality and decrease morbidity -Will place in observation status for CVA/TIA evaluation -Telemetry monitoring -MRI negative for acute CVA; this indicates that it was more likely TIA although it could also have been related to his progressive Parkinson's -CTA ordered -Echo ordered -Risk stratification with FLP, A1c; will also check TSH  -Patient will need DAPT for 21 days when ABCD2 score is at least 4 and NIH score is 3 or less, and then can transition to monotherapy with a single antiplatelet agent.  He was not taking ASA prior to event.  Will defer to neurology for now. -Neurology consult -PT/OT/ST/Nutrition Consults -Check FLP; start empiric Lipitor 40 mg daily  Parkinson's -Patient taking Carbidopa/Levadopa and also has a deep brain stimulator -That said, he seems to have progressive symptoms -Yesterday's episode could have been related to his Parkinson's -Neurology consult is pending -Continue Aricept melatonin  HTN -Allow permissive HTN for now -Treat BP only if >220/120, and then with goal of 15% reduction -Hold lisinopril and plan to restart in 48-72 hours   Hypothyroidism -Continue Synthroid   DNR -I have discussed code status with the patient and his wife/daughter and  they are in agreement that the patient would not desire resuscitation and would prefer to die a natural death should that situation  arise.     Advance Care Planning:   Code Status: DNR   Consults: Neurology; PT/OT/ST; nutrition; TOC team  DVT Prophylaxis: Lovenox  Family Communication: Wife and daughter were present throughout evaluation  Severity of Illness: The appropriate patient status for this patient is OBSERVATION. Observation status is judged to be reasonable and necessary in order to provide the required intensity of service to ensure the patient's safety. The patient's presenting symptoms, physical exam findings, and initial radiographic and laboratory data in the context of their medical condition is felt to place them at decreased risk for further clinical deterioration. Furthermore, it is anticipated that the patient will be medically stable for discharge from the hospital within 2 midnights of admission.  Author: Jonah Blue, MD 02/25/2023 6:34 PM  For on call review [email or web address].

## 2023-02-25 NOTE — ED Provider Notes (Signed)
[place] EMERGENCY DEPARTMENT AT [facility] Provider Note   CSN: [id number] Arrival date & time: 02/25/23  1059     History  Chief Complaint  Patient presents with   Aphasia    Reginald Jenkins is a 77 y.o. male.  Pt is a 77 yo male with pmhx significant for Parkinson's disease, HTN, hypothyroidism, anemia, and arthritis.  Pt has a deep brain stimulator ([employer] STN [id number]) that was placed in 2021 for Parkinson's tremors.  It was placed at [facility] in [place].  Pt had an episode of a severe headache yesterday at 1430 with 25 minutes of expressive aphasia.  Sx resolved and he is back to baseline.  He denies any other sx.  He does not have a hx of CVA.  Pt did not want to come to the hospital yesterday.  Family convinced him to come to the ED today.       Home Medications Prior to Admission medications   Medication Sig Start Date End Date Taking? Authorizing Provider  acetaminophen (TYLENOL) 500 MG tablet Take 500 mg by mouth every 6 (six) hours as needed (for pain.).    [provider]  carbidopa-levodopa (SINEMET IR) 25-100 MG tablet Take 1 tablet by mouth 4 (four) times daily.    [provider]  cyclobenzaprine (FLEXERIL) 10 MG tablet Take 1 tablet (10 mg total) by mouth 3 (three) times daily as needed for muscle spasms. 07/04/20   Angiulli, Mcarthur Rossetti, PA-C  docusate sodium (COLACE) 100 MG capsule Take 100 mg by mouth 3 (three) times daily.    [provider]  ferrous sulfate 325 (65 FE) MG tablet Take 1 tablet (325 mg total) by mouth 2 (two) times daily. 07/04/20   Angiulli, Mcarthur Rossetti, PA-C  levothyroxine (SYNTHROID) 125 MCG tablet Take 1 tablet (125 mcg total) by mouth daily before breakfast. 07/04/20 08/03/20  Angiulli, Mcarthur Rossetti, PA-C  lisinopril (ZESTRIL) 5 MG tablet Take 1 tablet (5 mg total) by mouth every evening. 07/04/20   Angiulli, Mcarthur Rossetti, PA-C  Multiple Vitamin (MULTIVITAMIN WITH MINERALS) TABS tablet Take 1 tablet by mouth daily.  Centrum    [provider]  olopatadine (PATANOL) 0.1 % ophthalmic solution Place 1 drop into both eyes 2 (two) times daily as needed for allergies.    [provider]  oxyCODONE (ROXICODONE) 15 MG immediate release tablet Take 1 tablet (15 mg total) by mouth 2 (two) times daily as needed for severe pain. 07/04/20   Angiulli, Mcarthur Rossetti, PA-C  polyethylene glycol (MIRALAX / GLYCOLAX) 17 g packet Take 17 g by mouth daily as needed for moderate constipation. 06/21/20   Arrien, York Ram, MD  risperiDONE (RISPERDAL) 0.5 MG tablet Take 1 tablet (0.5 mg total) by mouth at bedtime as needed (agitation). 07/04/20   Angiulli, Mcarthur Rossetti, PA-C  traZODone (DESYREL) 100 MG tablet Take 1 tablet (100 mg total) by mouth daily at 8 pm. 07/04/20   Angiulli, Mcarthur Rossetti, PA-C  trihexyphenidyl (ARTANE) 2 MG tablet Take 2 mg by mouth 3 (three) times daily with meals.     [provider]      Allergies    Patient has no known allergies.    Review of Systems   Review of Systems  Neurological:  Positive for speech difficulty.  All other systems reviewed and are negative.   Physical Exam Updated Vital Signs BP (!) 189/88 (BP Location: Right Arm)   Pulse 68   Temp 98.5 F (36.9 C) (Oral)  Resp 20   SpO2 97%  Physical Exam Vitals and nursing note reviewed.  Constitutional:      Appearance: Normal appearance.  HENT:     Head: Normocephalic and atraumatic.     Right Ear: External ear normal.     Left Ear: External ear normal.     Nose: Nose normal.     Mouth/Throat:     Mouth: Mucous membranes are moist.     Pharynx: Oropharynx is clear.  Eyes:     Extraocular Movements: Extraocular movements intact.     Conjunctiva/sclera: Conjunctivae normal.     Pupils: Pupils are equal, round, and reactive to light.  Cardiovascular:     Rate and Rhythm: Normal rate and regular rhythm.     Pulses: Normal pulses.     Heart sounds: Normal heart sounds.  Pulmonary:     Effort: Pulmonary  effort is normal.     Breath sounds: Normal breath sounds.  Abdominal:     General: Abdomen is flat. Bowel sounds are normal.     Palpations: Abdomen is soft.  Musculoskeletal:        General: Normal range of motion.     Cervical back: Normal range of motion and neck supple.  Skin:    General: Skin is warm.     Capillary Refill: Capillary refill takes less than 2 seconds.  Neurological:     General: No focal deficit present.     Mental Status: He is alert and oriented to person, place, and time.     Motor: Tremor present.  Psychiatric:        Mood and Affect: Mood normal.        Behavior: Behavior normal.     ED Results / Procedures / Treatments   Labs (all labs ordered are listed, but only abnormal results are displayed) Labs Reviewed  COMPREHENSIVE METABOLIC PANEL - Abnormal; Notable for the following components:      Result Value   CO2 21 (*)    Calcium 8.7 (*)    All other components within normal limits  I-STAT CHEM 8, ED - Abnormal; Notable for the following components:   Creatinine, Ser 0.60 (*)    Calcium, Ion 1.10 (*)    All other components within normal limits  PROTIME-INR  APTT  CBC  DIFFERENTIAL  ETHANOL  URINALYSIS, ROUTINE W REFLEX MICROSCOPIC  CBG MONITORING, ED    EKG EKG Interpretation  Date/Time:  Tuesday February 25 2023 11:09:01 EDT Ventricular Rate:  59 PR Interval:  142 QRS Duration: 88 QT Interval:  410 QTC Calculation: 405 R Axis:   -33 Text Interpretation: Sinus bradycardia Left axis deviation Pulmonary disease pattern Moderate voltage criteria for LVH, may be normal variant ( R in aVL , Cornell product ) Nonspecific ST abnormality Abnormal ECG When compared with ECG of 16-Jun-2020 14:26, PREVIOUS ECG IS PRESENT No significant change since last tracing Confirmed by Jacalyn Lefevre [phone]) on 02/25/2023 12:36:54 PM  Radiology CT HEAD WO CONTRAST  Result Date: 02/25/2023 CLINICAL DATA:  Transient ischemic attack. EXAM: CT HEAD WITHOUT  CONTRAST TECHNIQUE: Contiguous axial images were obtained from the base of the skull through the vertex without intravenous contrast. RADIATION DOSE REDUCTION: This exam was performed according to the departmental dose-optimization program which includes automated exposure control, adjustment of the mA and/or kV according to patient size and/or use of iterative reconstruction technique. COMPARISON:  Head CT 06/19/2020. FINDINGS: Brain: Unchanged bilateral deep brain stimulator electrodes targeting the subthalamic nuclei. Interval resolution of  lead edema. Within the limitations of streak artifact from DBS leads, no acute intracranial hemorrhage or loss of cortical gray-white differentiation. No hydrocephalus or extra-axial collection. No mass effect or midline shift. Vascular: No hyperdense vessel or unexpected calcification. Skull: Bilateral parietal burr holes.  No calvarial fracture. Sinuses/Orbits: Unremarkable. Other: None. IMPRESSION: 1. No acute intracranial abnormality. 2. Unchanged bilateral deep brain stimulator electrodes. Electronically Signed   By: Orvan Falconer M.D.   On: 02/25/2023 11:57    Procedures Procedures    Medications Ordered in ED Medications  sodium chloride 0.9 % bolus 1,000 mL (has no administration in time range)  acetaminophen (TYLENOL) tablet 1,000 mg (has no administration in time range)  aspirin tablet 325 mg (has no administration in time range)    ED Course/ Medical Decision Making/ A&P                             Medical Decision Making Amount and/or Complexity of Data Reviewed Labs: ordered. Radiology: ordered.  Risk OTC drugs. Decision regarding hospitalization.   This patient presents to the ED for concern of speech difficulty, this involves an extensive number of treatment options, and is a complaint that carries with it a high risk of complications and morbidity.  The differential diagnosis includes cva, tia   Co morbidities that complicate the  patient evaluation  arkinson's disease, HTN, hypothyroidism, anemia, and arthritis   Additional history obtained:  Additional history obtained from epic chart review External records from outside source obtained and reviewed including family   Lab Tests:  I Ordered, and personally interpreted labs.  The pertinent results include:  cmp nl, etoh nl, cbc nl   Imaging Studies ordered:  I ordered imaging studies including ct head/MRI brain  I independently visualized and interpreted imaging which showed  CT head: No acute intracranial abnormality.  2. Unchanged bilateral deep brain stimulator electrodes.  MRI brain pending on admission I agree with the radiologist interpretation   Cardiac Monitoring:  The patient was maintained on a cardiac monitor.  I personally viewed and interpreted the cardiac monitored which showed an underlying rhythm of: nsr   Medicines ordered and prescription drug management:  I ordered medication including ivfs and asa for sx  Reevaluation of the patient after these medicines showed that the patient improved I have reviewed the patients home medicines and have made adjustments as needed   Test Considered:  mri   Critical Interventions:  mri   Consultations Obtained:  I requested consultation with the neurologist (Dr. Otelia Limes),  and discussed lab and imaging findings as well as pertinent plan - he recommends admission for TIA work up. Pt d/w Dr. Ophelia Charter (triad) for admission.   Problem List / ED Course:  TIA:  Due to DBS, pt unable to go straight to MRI.  I called [employer] and spoke with the rep.  She came out and checked out his DBS and filled out the eligibility form.  I have taken pictures of this and put it in the media section.  MRI unable to take pt for several hours.  Family knows how to turn it off and on.  They will turn it off before MRI and turn it back on.  Pt d/w Dr. Otelia Limes (neuro) who recommends admission for TIA work  up.  Reevaluation:  After the interventions noted above, I reevaluated the patient and found that they have :improved   Social Determinants of Health:  Lives at home  Dispostion:  After consideration of the diagnostic results and the patients response to treatment, I feel that the patent would benefit from admission.    CRITICAL CARE Performed by: Jacalyn Lefevre   Total critical care time: 30 minutes  Critical care time was exclusive of separately billable procedures and treating other patients.  Critical care was necessary to treat or prevent imminent or life-threatening deterioration.  Critical care was time spent personally by me on the following activities: development of treatment plan with patient and/or surrogate as well as nursing, discussions with consultants, evaluation of patient's response to treatment, examination of patient, obtaining history from patient or surrogate, ordering and performing treatments and interventions, ordering and review of laboratory studies, ordering and review of radiographic studies, pulse oximetry and re-evaluation of patient's condition.        Final Clinical Impression(s) / ED Diagnoses Final diagnoses:  TIA (transient ischemic attack)  S/P deep brain stimulator placement    Rx / DC Orders ED Discharge Orders     None         Jacalyn Lefevre, MD 02/25/23 1535

## 2023-02-25 NOTE — ED Notes (Signed)
ED TO INPATIENT HANDOFF REPORT  ED Nurse Name and Phone #:  Marcello Moores [phone]  S Name/Age/Gender Reginald Jenkins 77 y.o. male Room/Bed: 043C/043C  Code Status   Code Status: DNR  Home/SNF/Other Home Patient oriented to: self, place, time, and situation Is this baseline? Yes   Triage Complete: Triage complete  Chief Complaint TIA (transient ischemic attack) [G45.9]  Triage Note Pt's wife reports yesterday at 1430 patient had sudden onset of the worst headache of his life and 25-minute episode of expressive aphasia. Symptoms, including headache, all resolved and patient back to his baseline. No hx TIA or stroke.    Allergies No Known Allergies  Level of Care/Admitting Diagnosis ED Disposition     ED Disposition  Admit   Condition  --   Comment  Hospital Area: [facility] [[id number]]  Level of Care: Telemetry Medical [[id number]]  May place patient in observation at [facility] or [facility] if equivalent level of care is available:: No  Covid Evaluation: Asymptomatic - no recent exposure (last 10 days) testing not required  Diagnosis: TIA (transient ischemic attack) [660630]  Admitting Physician: Jonah Blue [[id number]]  Attending Physician: Jonah Blue [[id number]]          B Medical/Surgery History 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   Arthritis    Broken collarbone    as a child   Dyspnea    when climbing stairs    GERD (gastroesophageal reflux disease)    Hypertension    Hypothyroidism    Parkinson disease 2010   Past Surgical History:  Procedure Laterality Date   APPENDECTOMY     arm surgery Left 1967   a rod was put in then 8 months it was removed   CHOLECYSTECTOMY     LAMINECTOMY  1986   ROTATOR CUFF REPAIR Right 2001   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   Procedure: LEFT TOTAL SHOULDER ARTHROPLASTY;  Surgeon: Francena Hanly, MD;  Location: [facility] OR;  Service:  Orthopedics;  Laterality: Left;     A IV Location/Drains/Wounds Patient Lines/Drains/Airways Status     Active Line/Drains/Airways     Name Placement date Placement time Site Days   Incision (Closed) 06/18/20 Head Left;Upper;Right;Posterior;Medial;Bilateral 06/18/20  2000  -- 982            Intake/Output Last 24 hours No intake or output data in the 24 hours ending 02/25/23 1658  Labs/Imaging Results for orders placed or performed during the hospital encounter of 02/25/23 (from the past 48 hour(s))  Protime-INR     Status: None   Collection Time: 02/25/23 11:22 AM  Result Value Ref Range   Prothrombin Time 13.2 11.4 - 15.2 seconds   INR 1.0 0.8 - 1.2    Comment: (NOTE) INR goal varies based on device and disease states. Performed at [facility], [address], [place], [place] [address]   APTT     Status: None   Collection Time: 02/25/23 11:22 AM  Result Value Ref Range   aPTT 27 24 - 36 seconds    Comment: Performed at [facility], [address] [place], [place] [address]  CBC     Status: None   Collection Time: 02/25/23 11:22 AM  Result Value Ref Range   WBC 7.4 4.0 - 10.5 K/uL   RBC 4.85 4.22 - 5.81 MIL/uL   Hemoglobin 14.6 13.0 - 17.0  g/dL   HCT 16.1 09.6 - 04.5 %   MCV 88.2 80.0 - 100.0 fL   MCH 30.1 26.0 - 34.0 pg   MCHC 34.1 30.0 - 36.0 g/dL   RDW 40.9 81.1 - 91.4 %   Platelets 215 150 - 400 K/uL   nRBC 0.0 0.0 - 0.2 %    Comment: Performed at [facility], [address], [place], [place] [address]  Differential     Status: None   Collection Time: 02/25/23 11:22 AM  Result Value Ref Range   Neutrophils Relative % 62 %   Neutro Abs 4.6 1.7 - 7.7 K/uL   Lymphocytes Relative 28 %   Lymphs Abs 2.0 0.7 - 4.0 K/uL   Monocytes Relative 10 %   Monocytes Absolute 0.8 0.1 - 1.0 K/uL   Eosinophils Relative 0 %   Eosinophils Absolute 0.0 0.0 - 0.5 K/uL   Basophils Relative 0 %   Basophils Absolute 0.0 0.0 - 0.1 K/uL   Immature  Granulocytes 0 %   Abs Immature Granulocytes 0.03 0.00 - 0.07 K/uL    Comment: Performed at [facility], [address] [place], [place] [address]  Comprehensive metabolic panel     Status: Abnormal   Collection Time: 02/25/23 11:22 AM  Result Value Ref Range   Sodium 135 135 - 145 mmol/L   Potassium 4.1 3.5 - 5.1 mmol/L   Chloride 103 98 - 111 mmol/L   CO2 21 (L) 22 - 32 mmol/L   Glucose, Bld 95 70 - 99 mg/dL    Comment: Glucose reference range applies only to samples taken after fasting for at least 8 hours.   BUN 16 8 - 23 mg/dL   Creatinine, Ser 8.65 0.61 - 1.24 mg/dL   Calcium 8.7 (L) 8.9 - 10.3 mg/dL   Total Protein 6.7 6.5 - 8.1 g/dL   Albumin 4.0 3.5 - 5.0 g/dL   AST 21 15 - 41 U/L   ALT 7 0 - 44 U/L   Alkaline Phosphatase 114 38 - 126 U/L   Total Bilirubin 0.3 0.3 - 1.2 mg/dL   GFR, Estimated >78 >46 mL/min    Comment: (NOTE) Calculated using the CKD-EPI Creatinine Equation (2021)    Anion gap 11 5 - 15    Comment: Performed at [facility], [address] [place], [place] [address]  Ethanol     Status: None   Collection Time: 02/25/23 11:22 AM  Result Value Ref Range   Alcohol, Ethyl (B) <10 <10 mg/dL    Comment: (NOTE) Lowest detectable limit for serum alcohol is 10 mg/dL.  For medical purposes only. Performed at [facility], [address] [place], [place] [address]   I-stat chem 8, ED     Status: Abnormal   Collection Time: 02/25/23 11:54 AM  Result Value Ref Range   Sodium 137 135 - 145 mmol/L   Potassium 4.1 3.5 - 5.1 mmol/L   Chloride 104 98 - 111 mmol/L   BUN 16 8 - 23 mg/dL   Creatinine, Ser 2.44 (L) 0.61 - 1.24 mg/dL   Glucose, Bld 93 70 - 99 mg/dL    Comment: Glucose reference range applies only to samples taken after fasting for at least 8 hours.   Calcium, Ion 1.10 (L) 1.15 - 1.40 mmol/L   TCO2 23 22 - 32 mmol/L   Hemoglobin 15.0 13.0 - 17.0 g/dL   HCT 01.0 27.2 - 53.6 %   MR  BRAIN WO CONTRAST  Result Date:  02/25/2023 CLINICAL DATA:  Stroke suspected EXAM: MRI HEAD WITHOUT CONTRAST TECHNIQUE: Multiplanar, multiecho pulse sequences of the brain and surrounding structures were obtained without intravenous contrast. COMPARISON:  Same day CT head, MR Head 06/21/20 FINDINGS: Brain: No acute infarction, hemorrhage, hydrocephalus, extra-axial collection or mass lesion.Bilateral DBS leads in place with the leads positioned near the subthalamic nuclei. Mega cisterna magna. Vascular: Normal flow voids. Skull and upper cervical spine: Normal marrow signal. Sinuses/Orbits: Mild pan sinus mucosal thickening.Orbits are unremarkable. No middle ear or mastoid effusion. Other: None. IMPRESSION: 1. No acute intracranial abnormality. 2. Bilateral DBS leads in place with the leads positioned near the subthalamic nuclei. Electronically Signed   By: Lorenza Cambridge M.D.   On: 02/25/2023 15:59   CT HEAD WO CONTRAST  Result Date: 02/25/2023 CLINICAL DATA:  Transient ischemic attack. EXAM: CT HEAD WITHOUT CONTRAST TECHNIQUE: Contiguous axial images were obtained from the base of the skull through the vertex without intravenous contrast. RADIATION DOSE REDUCTION: This exam was performed according to the departmental dose-optimization program which includes automated exposure control, adjustment of the mA and/or kV according to patient size and/or use of iterative reconstruction technique. COMPARISON:  Head CT 06/19/2020. FINDINGS: Brain: Unchanged bilateral deep brain stimulator electrodes targeting the subthalamic nuclei. Interval resolution of lead edema. Within the limitations of streak artifact from DBS leads, no acute intracranial hemorrhage or loss of cortical gray-white differentiation. No hydrocephalus or extra-axial collection. No mass effect or midline shift. Vascular: No hyperdense vessel or unexpected calcification. Skull: Bilateral parietal burr holes.  No calvarial fracture. Sinuses/Orbits: Unremarkable. Other: None. IMPRESSION:  1. No acute intracranial abnormality. 2. Unchanged bilateral deep brain stimulator electrodes. Electronically Signed   By: Orvan Falconer M.D.   On: 02/25/2023 11:57    Pending Labs Unresulted Labs (From admission, onward)     Start     Ordered   02/25/23 1648  Lipid panel  (Labs)  Once,   R       Comments: Fasting    02/25/23 1649   02/25/23 1648  TSH  Once,   R        02/25/23 1649   02/25/23 1646  Hemoglobin A1c  (Labs)  Once,   R       Comments: To assess prior glycemic control    02/25/23 1649            Vitals/Pain Today's Vitals   02/25/23 1107 02/25/23 1112 02/25/23 1349 02/25/23 1352  BP: (!) 196/99  (!) 189/88   Pulse: 68     Resp: 16  20   Temp: 98.4 F (36.9 C)  98.5 F (36.9 C) 98.5 F (36.9 C)  TempSrc:   Oral Oral  SpO2: 96%  97%   PainSc:  0-No pain      Isolation Precautions No active isolations  Medications Medications  aspirin tablet 325 mg (has no administration in time range)  carbidopa-levodopa (SINEMET IR) 25-100 MG per tablet immediate release 1 tablet (has no administration in time range)  trihexyphenidyl (ARTANE) tablet 2 mg (has no administration in time range)  cyclobenzaprine (FLEXERIL) tablet 10 mg (has no administration in time range)  enoxaparin (LOVENOX) injection 40 mg (has no administration in time range)   stroke: early stages of recovery book (has no administration in time range)  acetaminophen (TYLENOL) tablet 650 mg (has no administration in time range)    Or  acetaminophen (TYLENOL) 160 MG/5ML solution 650 mg (has no administration in time  range)    Or  acetaminophen (TYLENOL) suppository 650 mg (has no administration in time range)  senna-docusate (Senokot-S) tablet 1 tablet (has no administration in time range)    Mobility walks     Focused Assessments Neuro Assessment Handoff:  Swallow screen pass? Yes  Cardiac Rhythm: Normal sinus rhythm NIH Stroke Scale  Dizziness Present: No Headache Present:  No Interval: Initial Level of Consciousness (1a.)   : Alert, keenly responsive LOC Questions (1b. )   : Answers both questions correctly LOC Commands (1c. )   : Performs both tasks correctly Best Gaze (2. )  : Normal Visual (3. )  : No visual loss Facial Palsy (4. )    : Normal symmetrical movements Motor Arm, Left (5a. )   : No drift Motor Arm, Right (5b. ) : No drift Motor Leg, Left (6a. )  : No drift Motor Leg, Right (6b. ) : No drift Limb Ataxia (7. ): Absent Sensory (8. )  : Normal, no sensory loss Best Language (9. )  : No aphasia Dysarthria (10. ): Normal Extinction/Inattention (11.)   : No Abnormality Complete NIHSS TOTAL: 0     Neuro Assessment: Within Defined Limits Neuro Checks:   Initial (02/25/23 1306)  Has TPA been given? No If patient is a Neuro Trauma and patient is going to OR before floor call report to 4N Charge nurse: [phone] or [phone]   R Recommendations: See Admitting Provider Note  Report given to:   Additional Notes:

## 2023-02-26 ENCOUNTER — Observation Stay (HOSPITAL_COMMUNITY): Payer: No Typology Code available for payment source

## 2023-02-26 ENCOUNTER — Other Ambulatory Visit (HOSPITAL_COMMUNITY): Payer: Self-pay

## 2023-02-26 DIAGNOSIS — G43109 Migraine with aura, not intractable, without status migrainosus: Secondary | ICD-10-CM | POA: Diagnosis not present

## 2023-02-26 DIAGNOSIS — G459 Transient cerebral ischemic attack, unspecified: Secondary | ICD-10-CM | POA: Diagnosis not present

## 2023-02-26 DIAGNOSIS — R569 Unspecified convulsions: Secondary | ICD-10-CM

## 2023-02-26 DIAGNOSIS — Z9689 Presence of other specified functional implants: Secondary | ICD-10-CM

## 2023-02-26 LAB — ECHOCARDIOGRAM COMPLETE
Area-P 1/2: 2.95 cm2
Calc EF: 55.3 %
Height: 69 in
S' Lateral: 2.6 cm
Single Plane A2C EF: 57 %
Single Plane A4C EF: 51 %

## 2023-02-26 MED ORDER — ASPIRIN 81 MG PO TBEC
81.0000 mg | DELAYED_RELEASE_TABLET | Freq: Every day | ORAL | Status: DC
  Administered 2023-02-26: 81 mg via ORAL
  Filled 2023-02-26: qty 1

## 2023-02-26 MED ORDER — CLOPIDOGREL BISULFATE 75 MG PO TABS
75.0000 mg | ORAL_TABLET | Freq: Every day | ORAL | 0 refills | Status: AC
  Filled 2023-02-26: qty 21, 21d supply, fill #0

## 2023-02-26 MED ORDER — ATORVASTATIN CALCIUM 40 MG PO TABS
40.0000 mg | ORAL_TABLET | Freq: Every day | ORAL | 0 refills | Status: AC
  Filled 2023-02-26: qty 30, 30d supply, fill #0

## 2023-02-26 MED ORDER — ASPIRIN 81 MG PO TBEC: 81.0000 mg | DELAYED_RELEASE_TABLET | Freq: Every day | ORAL | 12 refills | Status: AC

## 2023-02-26 NOTE — Evaluation (Signed)
Occupational Therapy Evaluation Patient Details Name: Reginald Jenkins MRN: [id number] DOB: 1946/09/30 Today's Date: 02/26/2023   History of Present Illness Reginald Jenkins is a 77 y.o. male admitted under observation for TIA workup after experiencing difficulty of speech and sudden headache which has since resolved without medication. MRI negative for acute abnormality. PMH includes Parkinson's status post bilateral DBS placement, hypertension, hypothyroidism, anxiety.   Clinical Impression   Pt modified independent at baseline with the exception of  wife managing medications. Today he presents at baseline. He was able to demonstrate UB and LB ADL, standing grooming including fine motor tasks, visual scanning, transfers, side stepping, hallway ambulation and negotiating stairs. Pt does have baseline parkinson's symptoms but reports that these have not been exacerbated - he is at his baseline. Wife present and confirming all information. OT evaluation complete and will sign off.      Recommendations for follow up therapy are one component of a multi-disciplinary discharge planning process, led by the attending physician.  Recommendations may be updated based on patient status, additional functional criteria and insurance authorization.   Assistance Recommended at Discharge None  Patient can return home with the following      Functional Status Assessment  Patient has not had a recent decline in their functional status  Equipment Recommendations  None recommended by OT    Recommendations for Other Services       Precautions / Restrictions Restrictions Weight Bearing Restrictions: No      Mobility Bed Mobility Overal bed mobility: Independent                  Transfers Overall transfer level: Independent                        Balance Overall balance assessment: Independent                                         ADL either performed or  assessed with clinical judgement   ADL Overall ADL's : At baseline;Modified independent                                       General ADL Comments: able to demonstrate LB and UB ADL, standing grooming, transfers, etc without need for any kind of assist     Vision Baseline Vision/History: 0 No visual deficits Ability to See in Adequate Light: 0 Adequate Patient Visual Report: No change from baseline Vision Assessment?: No apparent visual deficits     Perception     Praxis      Pertinent Vitals/Pain Pain Assessment Pain Assessment: No/denies pain     Hand Dominance Right   Extremity/Trunk Assessment Upper Extremity Assessment Upper Extremity Assessment: Overall WFL for tasks assessed (baseline tremors)   Lower Extremity Assessment Lower Extremity Assessment: Overall WFL for tasks assessed   Cervical / Trunk Assessment Cervical / Trunk Assessment: Normal   Communication Communication Communication:  (baseline)   Cognition Arousal/Alertness: Awake/alert Behavior During Therapy: WFL for tasks assessed/performed Overall Cognitive Status: Within Functional Limits for tasks assessed                                       General  Comments  wife present throughout, Pt is being [facility] by [facility]    Exercises     Shoulder Instructions      Home Living Family/patient expects to be discharged to:: Private residence Living Arrangements: Spouse/significant other Available Help at Discharge: Family;Available 24 hours/day Type of Home: House Home Access: Stairs to enter;Ramped entrance Entrance Stairs-Number of Steps: 9 Entrance Stairs-Rails: Right;Left;Can reach both Home Layout: One level     Bathroom Shower/Tub: Producer, television/film/video: Handicapped height Bathroom Accessibility: Yes How Accessible: Accessible via wheelchair Home Equipment: Shower seat - built in;Hand held shower head;Cane - single point           Prior Functioning/Environment Prior Level of Function : Independent/Modified Independent               ADLs Comments: wife does manage medicine        OT Problem List:        OT Treatment/Interventions:      OT Goals(Current goals can be found in the care plan section) Acute Rehab OT Goals Patient Stated Goal: get thorough testing and get home OT Goal Formulation: With patient Time For Goal Achievement: 03/11/23 Potential to Achieve Goals: Good  OT Frequency:      Co-evaluation              AM-PAC OT "6 Clicks" Daily Activity     Outcome Measure Help from another person eating meals?: None Help from another person taking care of personal grooming?: None Help from another person toileting, which includes using toliet, bedpan, or urinal?: None Help from another person bathing (including washing, rinsing, drying)?: None Help from another person to put on and taking off regular upper body clothing?: None Help from another person to put on and taking off regular lower body clothing?: None 6 Click Score: 24   End of Session Equipment Utilized During Treatment: Gait belt Nurse Communication: Mobility status  Activity Tolerance: Patient tolerated treatment well Patient left: in chair;with family/visitor present                   Time: 1610-9604 OT Time Calculation (min): 26 min Charges:  OT General Charges $OT Visit: 1 Visit OT Evaluation $OT Eval Low Complexity: 1 Low  Nyoka Cowden OTR/L Acute Rehabilitation Services Office: [phone]  Evern Bio [facility] 02/26/2023, 12:42 PM

## 2023-02-26 NOTE — Progress Notes (Signed)
SLP Cancellation Note  Patient Details Name: Reginald Jenkins MRN: [id number] DOB: 06-14-1946   Cancelled treatment:       Reason Eval/Treat Not Completed: SLP screened, no needs identified, will sign off MRI was negative for acute changes, pt's acute language/cognition symptoms have resolved and, per pt and his family, his communication is now back to baseline with dysarthria (likely secondary to PD). A formal evaluation does not appear to be clinically indicated at this time. SLP will sign off.  Oather Muilenburg I. Vear Clock, MS, CCC-SLP Acute Rehabilitation Services Neuro Diagnostic Specialist Office number [phone]  Scheryl Marten 02/26/2023, 2:32 PM

## 2023-02-26 NOTE — Procedures (Signed)
Patient Name: Reginald Jenkins  MRN: [id number]  Epilepsy Attending: Charlsie Quest  Referring Physician/Provider: Marvel Plan, MD  Date: 02/26/2023 Duration: 23.32 mins  Patient history: 77 year old man presenting for 25 minutes episode of word finding difficulty and inability to bring his words out. EEG to evaluate for seizure  Level of alertness: Awake  AEDs during EEG study: None  Technical aspects: This EEG study was done with scalp electrodes positioned according to the 10-20 International system of electrode placement. Electrical activity was reviewed with band pass filter of 1-70Hz , sensitivity of 7 uV/mm, display speed of 24mm/sec with a  notched filter applied as appropriate. EEG data were recorded continuously and digitally stored.  Video monitoring was available and reviewed as appropriate.  Description: The posterior dominant rhythm consists of 8 Hz activity of moderate voltage (25-35 uV) seen predominantly in posterior head regions, symmetric and reactive to eye opening and eye closing. Hyperventilation and photic stimulation were not performed.     IMPRESSION: This study is within normal limits. No seizures or epileptiform discharges were seen throughout the recording.  A normal interictal EEG does not exclude the diagnosis of epilepsy.  Tierrah Anastos Annabelle Harman

## 2023-02-26 NOTE — Progress Notes (Signed)
STROKE TEAM PROGRESS NOTE   INTERVAL HISTORY His wife and daughter are at the bedside.  Patient admitted 4/16 for TIA workup after having a transient episode of not being able to get his words out that lasted about 25 minutes and then resolved.  On exam patient has no issues with speech and is ambulating in room unassisted.   Vitals:   02/26/23 0034 02/26/23 0357 02/26/23 0903 02/26/23 1114  BP: 110/70 127/72 (!) 150/86 122/88  Pulse: (!) 47 (!) 52 69 70  Resp: 16 12  17   Temp: 98.6 F (37 C) 98.8 F (37.1 C) 98 F (36.7 C) 98.5 F (36.9 C)  TempSrc: Oral  Oral Oral  SpO2: 95% 98% 100% 98%  Height:       CBC:  Recent Labs  Lab 02/25/23 1122 02/25/23 1154  WBC 7.4  --   NEUTROABS 4.6  --   HGB 14.6 15.0  HCT 42.8 44.0  MCV 88.2  --   PLT 215  --    Basic Metabolic Panel:  Recent Labs  Lab 02/25/23 1122 02/25/23 1154  NA 135 137  K 4.1 4.1  CL 103 104  CO2 21*  --   GLUCOSE 95 93  BUN 16 16  CREATININE 0.74 0.60*  CALCIUM 8.7*  --    Lipid Panel:  Recent Labs  Lab 02/25/23 1648  CHOL 183  TRIG 69  HDL 76  CHOLHDL 2.4  VLDL 14  LDLCALC 93   HgbA1c:  Recent Labs  Lab 02/25/23 1646  HGBA1C 5.7*   Urine Drug Screen: No results for input(s): "LABOPIA", "COCAINSCRNUR", "LABBENZ", "AMPHETMU", "THCU", "LABBARB" in the last 168 hours.  Alcohol Level  Recent Labs  Lab 02/25/23 1122  ETH <10    IMAGING past 24 hours ECHOCARDIOGRAM COMPLETE  Result Date: 02/26/2023    ECHOCARDIOGRAM REPORT   Patient Name:   Reginald Jenkins Date of Exam: 02/26/2023 Medical Rec #:  [id number]       Height:       69.0 in Accession #:    [id number]      Weight:       170.0 lb Date of Birth:  [birth year]      BSA:          1.928 m Patient Age:    76 years        BP:           127/72 mmHg Patient Gender: M               HR:           76 bpm. Exam Location:  Inpatient Procedure: 2D Echo, Color Doppler and Cardiac Doppler Indications:    TIA  History:        Patient has no prior  history of Echocardiogram examinations.                 TIA; Risk Factors:Hypertension.  Sonographer:    Milbert Coulter Referring Phys: [id number] JENNIFER YATES IMPRESSIONS  1. Left ventricular ejection fraction, by estimation, is 60 to 65%. The left ventricle has normal function. The left ventricle has no regional wall motion abnormalities. Left ventricular diastolic parameters are consistent with Grade I diastolic dysfunction (impaired relaxation).  2. Right ventricular systolic function is normal. The right ventricular size is normal. Tricuspid regurgitation signal is inadequate for assessing PA pressure.  3. The mitral valve is normal in structure. No evidence of mitral valve regurgitation. No evidence of mitral stenosis.  4. The aortic valve is tricuspid. There is mild thickening of the aortic valve. Aortic valve regurgitation is not visualized. Aortic valve sclerosis is present, with no evidence of aortic valve stenosis.  5. The inferior vena cava is normal in size with greater than 50% respiratory variability, suggesting right atrial pressure of 3 mmHg. FINDINGS  Left Ventricle: There appears to be diaphragmatic wall pseudodyskinesis, due to high infradiaphragmatic pressure. Left ventricular ejection fraction, by estimation, is 60 to 65%. The left ventricle has normal function. The left ventricle has no regional  wall motion abnormalities. The left ventricular internal cavity size was normal in size. There is no left ventricular hypertrophy. Left ventricular diastolic parameters are consistent with Grade I diastolic dysfunction (impaired relaxation). Right Ventricle: The right ventricular size is normal. No increase in right ventricular wall thickness. Right ventricular systolic function is normal. Tricuspid regurgitation signal is inadequate for assessing PA pressure. Left Atrium: Left atrial size was normal in size. Right Atrium: Right atrial size was normal in size. Pericardium: There is no evidence of  pericardial effusion. Mitral Valve: The mitral valve is normal in structure. Mild mitral annular calcification. No evidence of mitral valve regurgitation. No evidence of mitral valve stenosis. Tricuspid Valve: The tricuspid valve is normal in structure. Tricuspid valve regurgitation is not demonstrated. No evidence of tricuspid stenosis. Aortic Valve: The aortic valve is tricuspid. There is mild thickening of the aortic valve. Aortic valve regurgitation is not visualized. Aortic valve sclerosis is present, with no evidence of aortic valve stenosis. Pulmonic Valve: The pulmonic valve was not well visualized. Pulmonic valve regurgitation is not visualized. No evidence of pulmonic stenosis. Aorta: The aortic root is normal in size and structure. Venous: The inferior vena cava is normal in size with greater than 50% respiratory variability, suggesting right atrial pressure of 3 mmHg. IAS/Shunts: No atrial level shunt detected by color flow Doppler.  LEFT VENTRICLE PLAX 2D LVIDd:         3.60 cm     Diastology LVIDs:         2.60 cm     LV e' medial:    8.16 cm/s LV PW:         1.10 cm     LV E/e' medial:  7.7 LV IVS:        1.00 cm     LV e' lateral:   8.38 cm/s LVOT diam:     2.20 cm     LV E/e' lateral: 7.5 LVOT Area:     3.80 cm  LV Volumes (MOD) LV vol d, MOD A2C: 53.5 ml LV vol d, MOD A4C: 76.7 ml LV vol s, MOD A2C: 23.0 ml LV vol s, MOD A4C: 37.6 ml LV SV MOD A2C:     30.5 ml LV SV MOD A4C:     76.7 ml LV SV MOD BP:      36.7 ml RIGHT VENTRICLE RV Basal diam:  3.10 cm RV Mid diam:    2.80 cm RV S prime:     11.90 cm/s TAPSE (M-mode): 1.8 cm LEFT ATRIUM           Index        RIGHT ATRIUM          Index LA diam:      2.50 cm 1.30 cm/m   RA Area:     9.79 cm LA Vol (A4C): 33.1 ml 17.17 ml/m  RA Volume:   17.30 ml 8.97 ml/m  MITRAL VALVE MV Area (  PHT): 2.95 cm    SHUNTS MV Decel Time: 257 msec    Systemic Diam: 2.20 cm MV E velocity: 62.90 cm/s MV A velocity: 97.50 cm/s MV E/A ratio:  0.65 Mihai Croitoru MD  Electronically signed by Thurmon Fair MD Signature Date/Time: 02/26/2023/10:33:41 AM    Final    CT ANGIO HEAD NECK W WO CM  Result Date: 02/25/2023 CLINICAL DATA:  Stroke suspected EXAM: CT ANGIOGRAPHY HEAD AND NECK WITH AND WITHOUT CONTRAST TECHNIQUE: Multidetector CT imaging of the head and neck was performed using the standard protocol during bolus administration of intravenous contrast. Multiplanar CT image reconstructions and MIPs were obtained to evaluate the vascular anatomy. Carotid stenosis measurements (when applicable) are obtained utilizing NASCET criteria, using the distal internal carotid diameter as the denominator. RADIATION DOSE REDUCTION: This exam was performed according to the departmental dose-optimization program which includes automated exposure control, adjustment of the mA and/or kV according to patient size and/or use of iterative reconstruction technique. CONTRAST:  75mL OMNIPAQUE IOHEXOL 350 MG/ML SOLN COMPARISON:  No prior CTA, correlation is made with MRA head 06/21/2020 and CT head 02/25/2023 FINDINGS: CT HEAD FINDINGS For noncontrast findings, please see same day CT head. CTA NECK FINDINGS Aortic arch: Standard branching. Imaged portion shows no evidence of aneurysm or dissection. No significant stenosis of the major arch vessel origins. Right carotid system: No evidence of dissection, occlusion, or hemodynamically significant stenosis (greater than 50%). Left carotid system: No evidence of dissection, occlusion, or hemodynamically significant stenosis (greater than 50%). Vertebral arteries: Evaluation of the origin of the left vertebral artery is limited by beam hardening artifact from adjacent contrast bolus. The left vertebral artery is otherwise patent to the skull base. The right vertebral artery is patent from its origin to the skull base. No significant stenosis or dissection. Right dominant system. Skeleton: No acute fracture or suspicious osseous lesion. Degenerative  changes in the cervical spine. Other neck: No acute finding. Upper chest: No focal pulmonary opacity or pleural effusion. Review of the MIP images confirms the above findings CTA HEAD FINDINGS Anterior circulation: Both internal carotid arteries are patent to the termini, without significant stenosis. A1 segments patent, hypoplastic on the right. Normal anterior communicating artery. Anterior cerebral arteries are patent to their distal aspects without significant stenosis. No M1 stenosis or occlusion. MCA branches perfused to their distal aspects without significant stenosis. Posterior circulation: Vertebral arteries patent to the vertebrobasilar junction without significant stenosis. Posterior inferior cerebellar arteries patent proximally. Basilar patent to its distal aspect without significant stenosis. Superior cerebellar arteries patent proximally. Patent, diminutive right P1. Fetal origin of the left PCA from the left posterior communicating artery. Near fetal origin of the right PCA from the right posterior communicating artery. PCAs perfused to their distal aspects without significant stenosis. Venous sinuses: As permitted by contrast timing, patent. Anatomic variants: Fetal origin of the left PCA. Near fetal origin the right PCA. Review of the MIP images confirms the above findings IMPRESSION: 1. No intracranial large vessel occlusion or significant stenosis. 2. No hemodynamically significant stenosis in the neck. Electronically Signed   By: Wiliam Ke M.D.   On: 02/25/2023 19:20   MR BRAIN WO CONTRAST  Result Date: 02/25/2023 CLINICAL DATA:  Stroke suspected EXAM: MRI HEAD WITHOUT CONTRAST TECHNIQUE: Multiplanar, multiecho pulse sequences of the brain and surrounding structures were obtained without intravenous contrast. COMPARISON:  Same day CT head, MR Head 06/21/20 FINDINGS: Brain: No acute infarction, hemorrhage, hydrocephalus, extra-axial collection or mass lesion.Bilateral DBS leads in place  with the leads positioned near the subthalamic nuclei. Mega cisterna magna. Vascular: Normal flow voids. Skull and upper cervical spine: Normal marrow signal. Sinuses/Orbits: Mild pan sinus mucosal thickening.Orbits are unremarkable. No middle ear or mastoid effusion. Other: None. IMPRESSION: 1. No acute intracranial abnormality. 2. Bilateral DBS leads in place with the leads positioned near the subthalamic nuclei. Electronically Signed   By: Lorenza Cambridge M.D.   On: 02/25/2023 15:59    PHYSICAL EXAM General: Awake alert in no distress, ambulating in room then sitting in chair.  HEENT: Normocephalic atraumatic Lungs: Clear Cardiovascular: Regular rate rhythm Abdomen nondistended nontender Neurological exam Awake alert oriented x 3 Hypomimia No dysarthria No aphasia Cranial nerves: Pupils equal round reactive light, extraocular movements intact, visual fields full, no facial asymmetry.  Auditory acuity intact.  Tongue and palate midline. Motor examination: No drift in any of the 4 extremities.  There is resting tremor, L>R.  Sensory exam: Intact to light touch, symmetric Coordination: There is resting tremor on the left upper extremity which is more prominent than a very subtle resting tremor in the right upper extremity. Gait testing was deferred at this time NIH stroke scale-0  ASSESSMENT/PLAN Reginald Jenkins is a 77 y.o. male with history of Parkinson's status post bilateral DBS placement, hypertension, hypothyroidism, anxiety  presenting for 25 minutes episode of word finding difficulty and inability to bring his words out.  Symptoms concerning either for a freezing spell associated with Parkinson's disease or given his age and risk factors; admitted for TIA workup.     Likely complicated Migraine/Headache, less likely TIA Code Stroke CT head:  No acute intracranial abnormality.  CTA head & neck: Negative MRI: No acute intracranial abnormality. Bilateral DBS leads in place with the  leads positioned near the subthalamic nuclei 2D Echo: EF 60-65%, Grade 1 diastolic dysfunction, Aortic valve sclerosis present without stenosis.  EEG within normal limits LDL 93 HgbA1c 5.7 VTE prophylaxis - lovenox No antithrombotic prior to admission, now on aspirin 81 mg daily and clopidogrel 75 mg daily.  Continue aspirin and Plavix for 3 weeks, then aspirin alone. Therapy recommendations: Pending Disposition:  pending  Hypertension Home meds:  lisinopril  BP high on presentation, now within normal limits Long-term BP goal normotensive  Hyperlipidemia Home meds:  none LDL 93, goal < 70 Add Lipitor   Continue statin at discharge  Other Stroke Risk Factors Advanced Age >/= 36   Other Active Problems Parkinson's Disease Status post bilateral Deep brain stimulator in place. Home meds include Sinemet, Aricept  Hospital day # 0  Pt seen by Neuro NP/APP and later by MD. Note/plan to be edited by MD as needed.    Lynnae January, DNP, AGACNP-BC [facility] Please use AMION for pager and EPIC for messaging  ATTENDING NOTE: I reviewed above note and agree with the assessment and plan. Pt was seen and examined.   Wife and daughter are at bedside.  Patient sitting in chair, AOx3, neuro intact except left hand resting tremor with mild bradykinesia.  Per wife and patient, he had sudden onset severe headache 9/10 sharp at the vertex with speech difficulty yesterday for 20 to 25 minutes.  Symptoms are all resolved.  Patient does have history of " migraine" since a little boy, however infrequent, no need any medication and short lasting.  Did not have similar episode in the past.  Stroke workup all negative.  Patient symptoms concerning for complicated migraine/headache, less likely TIA or seizure.  However, given the  benefit of doubt, will treat with DAPT for 3 weeks and then aspirin alone.  Add Lipitor 40 for HLD.  Continue other home medication.  [facility] [place]  For detailed assessment and plan, please refer to above/below as I have made changes wherever appropriate.   Neurology will sign off. Please call with questions. [facility]. Thanks for the consult.  Marvel Plan, MD PhD Stroke Neurology 02/26/2023 2:53 PM     To contact Stroke Continuity provider, please refer to [email or web address]. After hours, contact General Neurology

## 2023-02-26 NOTE — Progress Notes (Signed)
EEG complete - results pending 

## 2023-02-26 NOTE — Discharge Summary (Signed)
Physician Discharge Summary   Patient: Reginald Jenkins MRN: [id number] DOB: 01-Jan-1946  Admit date:     02/25/2023  Discharge date: 02/26/23  Discharge Physician: Alberteen Sam   PCP: [name], NP     Recommendations at discharge:  Follow up with your primary care [facility] in 1 week after your episode, suspected TIA Follow up with your Neurologist at the [facility] in 1 month     Discharge Diagnoses: Principal Problem:   Suspected TIA (transient ischemic attack) Active Problems:   Parkinson's disease   HTN (hypertension)   Hypothyroidism       Hospital Course: Reginald Jenkins is a 77 y.o. M with Parkinson's disease and DBS who presented with a brief episode of headache and aphasia.  This episode was brief, occurred 1 day prior to admission and resolved spontaneously.    Suspected TIA  Neurology also suspect possibly this was a complicated migraine.  He underwent MRI brain that showed no acute abnormality. EEG showed no epileptiform activity. Echo showed no cardiogenic source.  CTA head and neck showed no large vessel disease.  LDL ordered, 93 mg/dL.  Started on medium dose Lipitor.  Neurology evaluated patient and recommended aspirin and Plavix for 3 weeks and follow up with outpatient Neurologist.          The [facility] Controlled Substances Registry was reviewed for this patient prior to discharge.   Consultants: Neurology, Dr. Roda Shutters Procedures performed:   CTA head and neck MRI brain Echo EEG   Disposition: Home Diet recommendation: Cardoac   DISCHARGE MEDICATION: Allergies as of 02/26/2023   No Known Allergies      Medication List     STOP taking these medications    methylPREDNISolone 4 MG Tbpk tablet Commonly known as: MEDROL DOSEPAK       TAKE these medications    acetaminophen 325 MG tablet Commonly known as: TYLENOL Take 650 mg by mouth daily as needed for moderate pain.   aspirin EC 81 MG tablet Take 1 tablet (81  mg total) by mouth daily. Swallow whole. Start taking on: February 27, 2023   atorvastatin 40 MG tablet Commonly known as: LIPITOR Take 1 tablet (40 mg total) by mouth daily. Start taking on: February 27, 2023   carbidopa-levodopa 25-100 MG tablet Commonly known as: SINEMET IR Take 1 tablet by mouth 4 (four) times daily.   carbidopa-levodopa 50-200 MG tablet Commonly known as: SINEMET CR Take 1 tablet by mouth at bedtime.   Carboxymethylcellulose Sodium 1 % Gel Place 1 application  into both eyes 2 (two) times daily.   celecoxib 100 MG capsule Commonly known as: CELEBREX Take 100 mg by mouth 2 (two) times daily.   cholecalciferol 25 MCG (1000 UNIT) tablet Commonly known as: VITAMIN D3 Take 1,000 Units by mouth daily.   clobetasol 0.05 % external solution Commonly known as: TEMOVATE Apply 1 Application topically 2 (two) times daily as needed (irritation, scaling).   clopidogrel 75 MG tablet Commonly known as: PLAVIX Take 1 tablet (75 mg total) by mouth daily. Start taking on: February 27, 2023   donepezil 10 MG tablet Commonly known as: ARICEPT Take 10 mg by mouth daily.   ketoconazole 2 % shampoo Commonly known as: NIZORAL Apply 1 Application topically See admin instructions. Wash scalp every other day, alternating with selenium sulfide 1% shampoo.   ketotifen 0.035 % ophthalmic solution Commonly known as: ZADITOR Place 1 drop into both eyes 2 (two) times daily as needed (itchy eyes).  levothyroxine 112 MCG tablet Commonly known as: SYNTHROID Take 112 mcg by mouth daily before breakfast. What changed: Another medication with the same name was removed. Continue taking this medication, and follow the directions you see here.   lisinopril 10 MG tablet Commonly known as: ZESTRIL Take 10 mg by mouth daily in the afternoon.   loratadine 10 MG tablet Commonly known as: CLARITIN Take 10 mg by mouth daily.   Melatonin 5 MG Caps Take 10 mg by mouth at bedtime.    multivitamin with minerals Tabs tablet Take 1 tablet by mouth daily.   omeprazole 20 MG capsule Commonly known as: PRILOSEC Take 20 mg by mouth See admin instructions. 20 mg every night at bedtime + 20 mg as needed for heartburn during the day.   selenium sulfide 1 % Lotn Commonly known as: SELSUN Apply 1 Application topically See admin instructions. Wash scalp every other day, alternating with Ketoconazole 2% shampoo.   sildenafil 100 MG tablet Commonly known as: VIAGRA Take 100 mg by mouth daily as needed for erectile dysfunction.   traZODone 100 MG tablet Commonly known as: DESYREL Take 1 tablet (100 mg total) by mouth daily at 8 pm. What changed: when to take this        Follow-up Information     Your Neurologist at the [facility]. Schedule an appointment as soon as possible for a visit in 1 month(s).                  Discharge Instructions     Diet - low sodium heart healthy   Complete by: As directed    Discharge instructions   Complete by: As directed    **IMPORTANT DISCHARGE INSTRUCTIONS**   From Dr. Maryfrances Bunnell: You were admitted for an episode of trouble speaking.  We believe this may have been a complex migraine or it may have been a "mini-stroke" or "TIA, transient ischemic attack".  If it was a migraine, no further treatment is necessary.  But if it was a TIA, we recommend that you take aspirin 81 mg and clopidogrel/Plavix 75 mg daily for 3 weeks then take aspirin alone indefinitely These medicines help prevent platelets from sticking together and causing strokes  Also take atorvastatin/Lipitor the cholesterol medicine to reduce cholesterol, which also contributes to strokes and mini-strokes  Go see your Neurologist at the [facility] as soon as possible   Increase activity slowly   Complete by: As directed        Discharge Exam: There were no vitals filed for this visit.  General: Pt is alert, awake, not in acute distress Cardiovascular: RRR, nl S1-S2, no  murmurs appreciated.   No LE edema.   Respiratory: Normal respiratory rate and rhythm.  CTAB without rales or wheezes. Abdominal: Abdomen soft and non-tender.  No distension or HSM.   Neuro/Psych: Strength symmetric in upper and lower extremities.  Judgment and insight appear normal.  Resting tremor noted.   Condition at discharge: stable  The results of significant diagnostics from this hospitalization (including imaging, microbiology, ancillary and laboratory) are listed below for reference.   Imaging Studies: EEG adult  Result Date: 2023/03/23 Reginald Quest, MD     2023-03-23  2:02 PM Patient Name: Reginald Jenkins MRN: [id number] Epilepsy Attending: Charlsie Jenkins Referring Physician/Provider: Marvel Plan, MD Date: 03/23/23 Duration: 23.32 mins Patient history: 77 year old man presenting for 25 minutes episode of word finding difficulty and inability to bring his words out. EEG to evaluate for seizure Level of  alertness: Awake AEDs during EEG study: None Technical aspects: This EEG study was done with scalp electrodes positioned according to the 10-20 International system of electrode placement. Electrical activity was reviewed with band pass filter of 1-70Hz , sensitivity of 7 uV/mm, display speed of 33mm/sec with a  notched filter applied as appropriate. EEG data were recorded continuously and digitally stored.  Video monitoring was available and reviewed as appropriate. Description: The posterior dominant rhythm consists of 8 Hz activity of moderate voltage (25-35 uV) seen predominantly in posterior head regions, symmetric and reactive to eye opening and eye closing. Hyperventilation and photic stimulation were not performed.   IMPRESSION: This study is within normal limits. No seizures or epileptiform discharges were seen throughout the recording. A normal interictal EEG does not exclude the diagnosis of epilepsy. Reginald Jenkins   ECHOCARDIOGRAM COMPLETE  Result Date: 02/26/2023     ECHOCARDIOGRAM REPORT   Patient Name:   Reginald Jenkins Date of Exam: 02/26/2023 Medical Rec #:  [id number]       Height:       69.0 in Accession #:    [id number]      Weight:       170.0 lb Date of Birth:  October 11, 1946      BSA:          1.928 m Patient Age:    76 years        BP:           127/72 mmHg Patient Gender: M               HR:           76 bpm. Exam Location:  Inpatient Procedure: 2D Echo, Color Doppler and Cardiac Doppler Indications:    TIA  History:        Patient has no prior history of Echocardiogram examinations.                 TIA; Risk Factors:Hypertension.  Sonographer:    Milbert Coulter Referring Phys: [id number] JENNIFER YATES IMPRESSIONS  1. Left ventricular ejection fraction, by estimation, is 60 to 65%. The left ventricle has normal function. The left ventricle has no regional wall motion abnormalities. Left ventricular diastolic parameters are consistent with Grade I diastolic dysfunction (impaired relaxation).  2. Right ventricular systolic function is normal. The right ventricular size is normal. Tricuspid regurgitation signal is inadequate for assessing PA pressure.  3. The mitral valve is normal in structure. No evidence of mitral valve regurgitation. No evidence of mitral stenosis.  4. The aortic valve is tricuspid. There is mild thickening of the aortic valve. Aortic valve regurgitation is not visualized. Aortic valve sclerosis is present, with no evidence of aortic valve stenosis.  5. The inferior vena cava is normal in size with greater than 50% respiratory variability, suggesting right atrial pressure of 3 mmHg. FINDINGS  Left Ventricle: There appears to be diaphragmatic wall pseudodyskinesis, due to high infradiaphragmatic pressure. Left ventricular ejection fraction, by estimation, is 60 to 65%. The left ventricle has normal function. The left ventricle has no regional  wall motion abnormalities. The left ventricular internal cavity size was normal in size. There is no left ventricular  hypertrophy. Left ventricular diastolic parameters are consistent with Grade I diastolic dysfunction (impaired relaxation). Right Ventricle: The right ventricular size is normal. No increase in right ventricular wall thickness. Right ventricular systolic function is normal. Tricuspid regurgitation signal is inadequate for assessing PA pressure. Left Atrium: Left atrial size was normal  in size. Right Atrium: Right atrial size was normal in size. Pericardium: There is no evidence of pericardial effusion. Mitral Valve: The mitral valve is normal in structure. Mild mitral annular calcification. No evidence of mitral valve regurgitation. No evidence of mitral valve stenosis. Tricuspid Valve: The tricuspid valve is normal in structure. Tricuspid valve regurgitation is not demonstrated. No evidence of tricuspid stenosis. Aortic Valve: The aortic valve is tricuspid. There is mild thickening of the aortic valve. Aortic valve regurgitation is not visualized. Aortic valve sclerosis is present, with no evidence of aortic valve stenosis. Pulmonic Valve: The pulmonic valve was not well visualized. Pulmonic valve regurgitation is not visualized. No evidence of pulmonic stenosis. Aorta: The aortic root is normal in size and structure. Venous: The inferior vena cava is normal in size with greater than 50% respiratory variability, suggesting right atrial pressure of 3 mmHg. IAS/Shunts: No atrial level shunt detected by color flow Doppler.  LEFT VENTRICLE PLAX 2D LVIDd:         3.60 cm     Diastology LVIDs:         2.60 cm     LV e' medial:    8.16 cm/s LV PW:         1.10 cm     LV E/e' medial:  7.7 LV IVS:        1.00 cm     LV e' lateral:   8.38 cm/s LVOT diam:     2.20 cm     LV E/e' lateral: 7.5 LVOT Area:     3.80 cm  LV Volumes (MOD) LV vol d, MOD A2C: 53.5 ml LV vol d, MOD A4C: 76.7 ml LV vol s, MOD A2C: 23.0 ml LV vol s, MOD A4C: 37.6 ml LV SV MOD A2C:     30.5 ml LV SV MOD A4C:     76.7 ml LV SV MOD BP:      36.7 ml RIGHT  VENTRICLE RV Basal diam:  3.10 cm RV Mid diam:    2.80 cm RV S prime:     11.90 cm/s TAPSE (M-mode): 1.8 cm LEFT ATRIUM           Index        RIGHT ATRIUM          Index LA diam:      2.50 cm 1.30 cm/m   RA Area:     9.79 cm LA Vol (A4C): 33.1 ml 17.17 ml/m  RA Volume:   17.30 ml 8.97 ml/m  MITRAL VALVE MV Area (PHT): 2.95 cm    SHUNTS MV Decel Time: 257 msec    Systemic Diam: 2.20 cm MV E velocity: 62.90 cm/s MV A velocity: 97.50 cm/s MV E/A ratio:  0.65 Mihai Croitoru MD Electronically signed by Thurmon Fair MD Signature Date/Time: 02/26/2023/10:33:41 AM    Final    CT ANGIO HEAD NECK W WO CM  Result Date: 02/25/2023 CLINICAL DATA:  Stroke suspected EXAM: CT ANGIOGRAPHY HEAD AND NECK WITH AND WITHOUT CONTRAST TECHNIQUE: Multidetector CT imaging of the head and neck was performed using the standard protocol during bolus administration of intravenous contrast. Multiplanar CT image reconstructions and MIPs were obtained to evaluate the vascular anatomy. Carotid stenosis measurements (when applicable) are obtained utilizing NASCET criteria, using the distal internal carotid diameter as the denominator. RADIATION DOSE REDUCTION: This exam was performed according to the departmental dose-optimization program which includes automated exposure control, adjustment of the mA and/or kV according to patient size and/or use  of iterative reconstruction technique. CONTRAST:  75mL OMNIPAQUE IOHEXOL 350 MG/ML SOLN COMPARISON:  No prior CTA, correlation is made with MRA head 06/21/2020 and CT head 02/25/2023 FINDINGS: CT HEAD FINDINGS For noncontrast findings, please see same day CT head. CTA NECK FINDINGS Aortic arch: Standard branching. Imaged portion shows no evidence of aneurysm or dissection. No significant stenosis of the major arch vessel origins. Right carotid system: No evidence of dissection, occlusion, or hemodynamically significant stenosis (greater than 50%). Left carotid system: No evidence of dissection,  occlusion, or hemodynamically significant stenosis (greater than 50%). Vertebral arteries: Evaluation of the origin of the left vertebral artery is limited by beam hardening artifact from adjacent contrast bolus. The left vertebral artery is otherwise patent to the skull base. The right vertebral artery is patent from its origin to the skull base. No significant stenosis or dissection. Right dominant system. Skeleton: No acute fracture or suspicious osseous lesion. Degenerative changes in the cervical spine. Other neck: No acute finding. Upper chest: No focal pulmonary opacity or pleural effusion. Review of the MIP images confirms the above findings CTA HEAD FINDINGS Anterior circulation: Both internal carotid arteries are patent to the termini, without significant stenosis. A1 segments patent, hypoplastic on the right. Normal anterior communicating artery. Anterior cerebral arteries are patent to their distal aspects without significant stenosis. No M1 stenosis or occlusion. MCA branches perfused to their distal aspects without significant stenosis. Posterior circulation: Vertebral arteries patent to the vertebrobasilar junction without significant stenosis. Posterior inferior cerebellar arteries patent proximally. Basilar patent to its distal aspect without significant stenosis. Superior cerebellar arteries patent proximally. Patent, diminutive right P1. Fetal origin of the left PCA from the left posterior communicating artery. Near fetal origin of the right PCA from the right posterior communicating artery. PCAs perfused to their distal aspects without significant stenosis. Venous sinuses: As permitted by contrast timing, patent. Anatomic variants: Fetal origin of the left PCA. Near fetal origin the right PCA. Review of the MIP images confirms the above findings IMPRESSION: 1. No intracranial large vessel occlusion or significant stenosis. 2. No hemodynamically significant stenosis in the neck. Electronically  Signed   By: Wiliam Ke M.D.   On: 02/25/2023 19:20   MR BRAIN WO CONTRAST  Result Date: 02/25/2023 CLINICAL DATA:  Stroke suspected EXAM: MRI HEAD WITHOUT CONTRAST TECHNIQUE: Multiplanar, multiecho pulse sequences of the brain and surrounding structures were obtained without intravenous contrast. COMPARISON:  Same day CT head, MR Head 06/21/20 FINDINGS: Brain: No acute infarction, hemorrhage, hydrocephalus, extra-axial collection or mass lesion.Bilateral DBS leads in place with the leads positioned near the subthalamic nuclei. Mega cisterna magna. Vascular: Normal flow voids. Skull and upper cervical spine: Normal marrow signal. Sinuses/Orbits: Mild pan sinus mucosal thickening.Orbits are unremarkable. No middle ear or mastoid effusion. Other: None. IMPRESSION: 1. No acute intracranial abnormality. 2. Bilateral DBS leads in place with the leads positioned near the subthalamic nuclei. Electronically Signed   By: Lorenza Cambridge M.D.   On: 02/25/2023 15:59   CT HEAD WO CONTRAST  Result Date: 02/25/2023 CLINICAL DATA:  Transient ischemic attack. EXAM: CT HEAD WITHOUT CONTRAST TECHNIQUE: Contiguous axial images were obtained from the base of the skull through the vertex without intravenous contrast. RADIATION DOSE REDUCTION: This exam was performed according to the departmental dose-optimization program which includes automated exposure control, adjustment of the mA and/or kV according to patient size and/or use of iterative reconstruction technique. COMPARISON:  Head CT 06/19/2020. FINDINGS: Brain: Unchanged bilateral deep brain stimulator electrodes targeting the subthalamic nuclei.  Interval resolution of lead edema. Within the limitations of streak artifact from DBS leads, no acute intracranial hemorrhage or loss of cortical gray-white differentiation. No hydrocephalus or extra-axial collection. No mass effect or midline shift. Vascular: No hyperdense vessel or unexpected calcification. Skull: Bilateral  parietal burr holes.  No calvarial fracture. Sinuses/Orbits: Unremarkable. Other: None. IMPRESSION: 1. No acute intracranial abnormality. 2. Unchanged bilateral deep brain stimulator electrodes. Electronically Signed   By: Orvan Falconer M.D.   On: 02/25/2023 11:57    Microbiology: Results for orders placed or performed during the hospital encounter of 06/16/20  SARS Coronavirus 2 by RT PCR (hospital order, performed in [facility] hospital lab) Nasopharyngeal Nasopharyngeal Swab     Status: None   Collection Time: 06/16/20  7:17 PM   Specimen: Nasopharyngeal Swab  Result Value Ref Range Status   SARS Coronavirus 2 NEGATIVE NEGATIVE Final    Comment: (NOTE) SARS-CoV-2 target nucleic acids are NOT DETECTED.  The SARS-CoV-2 RNA is generally detectable in upper and lower respiratory specimens during the acute phase of infection. The lowest concentration of SARS-CoV-2 viral copies this assay can detect is 250 copies / mL. A negative result does not preclude SARS-CoV-2 infection and should not be used as the sole basis for treatment or other patient management decisions.  A negative result may occur with improper specimen collection / handling, submission of specimen other than nasopharyngeal swab, presence of viral mutation(s) within the areas targeted by this assay, and inadequate number of viral copies (<250 copies / mL). A negative result must be combined with clinical observations, patient history, and epidemiological information.  Fact Sheet for Patients:   BoilerBrush.com.cy  Fact Sheet for Healthcare Providers: https://pope.com/  This test is not yet approved or  cleared by the [place] FDA and has been authorized for detection and/or diagnosis of SARS-CoV-2 by FDA under an Emergency Use Authorization (EUA).  This EUA will remain in effect (meaning this test can be used) for the duration of the COVID-19 declaration under  Section 564(b)(1) of the Act, 21 U.S.C. section 360bbb-3(b)(1), unless the authorization is terminated or revoked sooner.  Performed at [facility], [address], [place], [place] [address]     Labs: CBC: Recent Labs  Lab 02/25/23 1122 02/25/23 1154  WBC 7.4  --   NEUTROABS 4.6  --   HGB 14.6 15.0  HCT 42.8 44.0  MCV 88.2  --   PLT 215  --    Basic Metabolic Panel: Recent Labs  Lab 02/25/23 1122 02/25/23 1154  NA 135 137  K 4.1 4.1  CL 103 104  CO2 21*  --   GLUCOSE 95 93  BUN 16 16  CREATININE 0.74 0.60*  CALCIUM 8.7*  --    Liver Function Tests: Recent Labs  Lab 02/25/23 1122  AST 21  ALT 7  ALKPHOS 114  BILITOT 0.3  PROT 6.7  ALBUMIN 4.0   CBG: No results for input(s): "GLUCAP" in the last 168 hours.  Discharge time spent: approximately 35 minutes spent on discharge counseling, evaluation of patient on day of discharge, and coordination of discharge planning with nursing, social work, pharmacy and case management  Signed: Alberteen Sam, MD [facility] 02/26/2023

## 2023-02-26 NOTE — TOC Transition Note (Signed)
Transition of Care [facility]) - CM/SW Discharge Note   Patient Details  Name: Reginald Jenkins MRN: [id number] Date of Birth: August 29, 1946  [facility] CM/SW Contact:  Kermit Balo, RN Phone Number: 02/26/2023, 3:44 PM   Clinical Narrative:    Pt is discharging home with self care. No needs per TOC.   Final next level of care: Home/Self Care Barriers to Discharge: No Barriers Identified   Patient Goals and CMS Choice      Discharge Placement                         Discharge Plan and Services Additional resources added to the After Visit Summary for                                       Social Determinants of Health (SDOH) Interventions SDOH Screenings   Food Insecurity: No Food Insecurity (02/25/2023)  Housing: Low Risk  (02/25/2023)  Transportation Needs: No Transportation Needs (02/25/2023)  Utilities: Not At Risk (02/25/2023)  Tobacco Use: Medium Risk (02/25/2023)     Readmission Risk Interventions     No data to display

## 2023-02-26 NOTE — Progress Notes (Signed)
Discharge to home. IV removed. Discharged instructions provided to patient and family. All questions answered.

## 2023-02-26 NOTE — Progress Notes (Signed)
PT Cancellation Note  Patient Details Name: Reginald Jenkins MRN: [id number] DOB: 01/11/1946   Cancelled Treatment:    Reason Eval/Treat Not Completed: PT screened, no needs identified, will sign off (OT evaluated; reports pt is back to baseline).  Lillia Pauls, PT, DPT Acute Rehabilitation Services Office [phone]    Norval Morton 02/26/2023, 9:25 AM
